# Patient Record
Sex: Female | Born: 1950 | Race: White | Hispanic: No | Marital: Married | Smoking: Former smoker
Health system: Southern US, Community
[De-identification: ages and names within clinical notes are randomized; demographics above are authoritative.]

## PROBLEM LIST (undated history)

## (undated) DIAGNOSIS — F419 Anxiety disorder, unspecified: Secondary | ICD-10-CM

## (undated) DIAGNOSIS — H269 Unspecified cataract: Secondary | ICD-10-CM

## (undated) DIAGNOSIS — K5792 Diverticulitis of intestine, part unspecified, without perforation or abscess without bleeding: Secondary | ICD-10-CM

## (undated) DIAGNOSIS — G709 Myoneural disorder, unspecified: Secondary | ICD-10-CM

## (undated) DIAGNOSIS — E782 Mixed hyperlipidemia: Secondary | ICD-10-CM

## (undated) DIAGNOSIS — F32A Depression, unspecified: Secondary | ICD-10-CM

## (undated) DIAGNOSIS — K219 Gastro-esophageal reflux disease without esophagitis: Secondary | ICD-10-CM

## (undated) DIAGNOSIS — F329 Major depressive disorder, single episode, unspecified: Secondary | ICD-10-CM

## (undated) DIAGNOSIS — M1990 Unspecified osteoarthritis, unspecified site: Secondary | ICD-10-CM

## (undated) DIAGNOSIS — T7840XA Allergy, unspecified, initial encounter: Secondary | ICD-10-CM

## (undated) DIAGNOSIS — J439 Emphysema, unspecified: Secondary | ICD-10-CM

## (undated) DIAGNOSIS — M810 Age-related osteoporosis without current pathological fracture: Secondary | ICD-10-CM

## (undated) DIAGNOSIS — K559 Vascular disorder of intestine, unspecified: Secondary | ICD-10-CM

## (undated) DIAGNOSIS — L719 Rosacea, unspecified: Secondary | ICD-10-CM

## (undated) HISTORY — DX: Vascular disorder of intestine, unspecified: K55.9

## (undated) HISTORY — DX: Diverticulitis of intestine, part unspecified, without perforation or abscess without bleeding: K57.92

## (undated) HISTORY — PX: APPENDECTOMY: SHX54

## (undated) HISTORY — DX: Depression, unspecified: F32.A

## (undated) HISTORY — DX: Unspecified cataract: H26.9

## (undated) HISTORY — DX: Major depressive disorder, single episode, unspecified: F32.9

## (undated) HISTORY — DX: Unspecified osteoarthritis, unspecified site: M19.90

## (undated) HISTORY — DX: Anxiety disorder, unspecified: F41.9

## (undated) HISTORY — DX: Mixed hyperlipidemia: E78.2

## (undated) HISTORY — DX: Myoneural disorder, unspecified: G70.9

## (undated) HISTORY — DX: Rosacea, unspecified: L71.9

## (undated) HISTORY — PX: TUBAL LIGATION: SHX77

## (undated) HISTORY — PX: ABDOMINAL HYSTERECTOMY: SHX81

## (undated) HISTORY — PX: SPINE SURGERY: SHX786

## (undated) HISTORY — DX: Emphysema, unspecified: J43.9

## (undated) HISTORY — DX: Allergy, unspecified, initial encounter: T78.40XA

## (undated) HISTORY — PX: MICRODISCECTOMY LUMBAR: SUR864

## (undated) HISTORY — DX: Gastro-esophageal reflux disease without esophagitis: K21.9

## (undated) HISTORY — DX: Age-related osteoporosis without current pathological fracture: M81.0

---

## 1998-01-03 ENCOUNTER — Other Ambulatory Visit: Admission: RE | Admit: 1998-01-03 | Discharge: 1998-01-03 | Payer: Self-pay | Admitting: Obstetrics and Gynecology

## 1998-08-12 HISTORY — PX: LUMBAR LAMINECTOMY: SHX95

## 1998-08-24 ENCOUNTER — Encounter: Admission: RE | Admit: 1998-08-24 | Discharge: 1998-11-22 | Payer: Self-pay | Admitting: Orthopaedic Surgery

## 1998-10-17 ENCOUNTER — Ambulatory Visit (HOSPITAL_COMMUNITY): Admission: RE | Admit: 1998-10-17 | Discharge: 1998-10-17 | Payer: Self-pay | Admitting: Neurosurgery

## 1998-11-10 ENCOUNTER — Ambulatory Visit (HOSPITAL_COMMUNITY): Admission: RE | Admit: 1998-11-10 | Discharge: 1998-11-10 | Payer: Self-pay | Admitting: Neurosurgery

## 1999-01-18 ENCOUNTER — Observation Stay (HOSPITAL_COMMUNITY): Admission: RE | Admit: 1999-01-18 | Discharge: 1999-01-19 | Payer: Self-pay | Admitting: Neurosurgery

## 1999-07-31 ENCOUNTER — Other Ambulatory Visit: Admission: RE | Admit: 1999-07-31 | Discharge: 1999-07-31 | Payer: Self-pay | Admitting: Obstetrics and Gynecology

## 2000-05-30 ENCOUNTER — Encounter: Payer: Self-pay | Admitting: Obstetrics and Gynecology

## 2000-05-30 ENCOUNTER — Encounter: Admission: RE | Admit: 2000-05-30 | Discharge: 2000-05-30 | Payer: Self-pay | Admitting: Obstetrics and Gynecology

## 2000-07-31 ENCOUNTER — Other Ambulatory Visit: Admission: RE | Admit: 2000-07-31 | Discharge: 2000-07-31 | Payer: Self-pay | Admitting: Vascular Surgery

## 2000-10-03 ENCOUNTER — Ambulatory Visit (HOSPITAL_COMMUNITY): Admission: RE | Admit: 2000-10-03 | Discharge: 2000-10-03 | Payer: Self-pay | Admitting: Gastroenterology

## 2001-06-05 ENCOUNTER — Encounter: Admission: RE | Admit: 2001-06-05 | Discharge: 2001-06-05 | Payer: Self-pay | Admitting: Obstetrics and Gynecology

## 2001-06-05 ENCOUNTER — Encounter: Payer: Self-pay | Admitting: Obstetrics and Gynecology

## 2001-08-03 ENCOUNTER — Other Ambulatory Visit: Admission: RE | Admit: 2001-08-03 | Discharge: 2001-08-03 | Payer: Self-pay | Admitting: Obstetrics and Gynecology

## 2001-09-14 ENCOUNTER — Encounter: Payer: Self-pay | Admitting: Gastroenterology

## 2001-09-14 ENCOUNTER — Ambulatory Visit (HOSPITAL_COMMUNITY): Admission: RE | Admit: 2001-09-14 | Discharge: 2001-09-14 | Payer: Self-pay | Admitting: Gastroenterology

## 2002-06-09 ENCOUNTER — Encounter: Admission: RE | Admit: 2002-06-09 | Discharge: 2002-06-09 | Payer: Self-pay | Admitting: Obstetrics and Gynecology

## 2002-06-09 ENCOUNTER — Encounter: Payer: Self-pay | Admitting: Obstetrics and Gynecology

## 2002-08-12 HISTORY — PX: CARPAL TUNNEL RELEASE: SHX101

## 2002-08-23 ENCOUNTER — Other Ambulatory Visit: Admission: RE | Admit: 2002-08-23 | Discharge: 2002-08-23 | Payer: Self-pay | Admitting: Obstetrics and Gynecology

## 2002-11-15 ENCOUNTER — Ambulatory Visit (HOSPITAL_COMMUNITY): Admission: RE | Admit: 2002-11-15 | Discharge: 2002-11-15 | Payer: Self-pay | Admitting: Internal Medicine

## 2002-11-15 ENCOUNTER — Encounter: Payer: Self-pay | Admitting: Internal Medicine

## 2003-07-18 ENCOUNTER — Encounter: Admission: RE | Admit: 2003-07-18 | Discharge: 2003-07-18 | Payer: Self-pay | Admitting: Obstetrics and Gynecology

## 2003-08-30 ENCOUNTER — Emergency Department (HOSPITAL_COMMUNITY): Admission: EM | Admit: 2003-08-30 | Discharge: 2003-08-31 | Payer: Self-pay | Admitting: Emergency Medicine

## 2004-01-31 ENCOUNTER — Encounter: Admission: RE | Admit: 2004-01-31 | Discharge: 2004-03-14 | Payer: Self-pay | Admitting: Neurosurgery

## 2004-02-02 ENCOUNTER — Ambulatory Visit (HOSPITAL_COMMUNITY): Admission: RE | Admit: 2004-02-02 | Discharge: 2004-02-02 | Payer: Self-pay | Admitting: Internal Medicine

## 2004-09-07 ENCOUNTER — Ambulatory Visit (HOSPITAL_COMMUNITY): Admission: RE | Admit: 2004-09-07 | Discharge: 2004-09-07 | Payer: Self-pay | Admitting: Obstetrics and Gynecology

## 2005-08-22 ENCOUNTER — Encounter: Admission: RE | Admit: 2005-08-22 | Discharge: 2005-08-22 | Payer: Self-pay | Admitting: Neurosurgery

## 2005-09-13 ENCOUNTER — Encounter: Admission: RE | Admit: 2005-09-13 | Discharge: 2005-09-13 | Payer: Self-pay | Admitting: Neurosurgery

## 2005-09-27 ENCOUNTER — Encounter: Admission: RE | Admit: 2005-09-27 | Discharge: 2005-09-27 | Payer: Self-pay | Admitting: Neurosurgery

## 2005-12-09 ENCOUNTER — Ambulatory Visit (HOSPITAL_COMMUNITY): Admission: RE | Admit: 2005-12-09 | Discharge: 2005-12-09 | Payer: Self-pay | Admitting: Obstetrics and Gynecology

## 2006-02-14 ENCOUNTER — Ambulatory Visit (HOSPITAL_COMMUNITY): Admission: RE | Admit: 2006-02-14 | Discharge: 2006-02-14 | Payer: Self-pay | Admitting: Internal Medicine

## 2006-03-05 ENCOUNTER — Ambulatory Visit: Payer: Self-pay | Admitting: Cardiology

## 2006-03-11 ENCOUNTER — Ambulatory Visit: Payer: Self-pay | Admitting: Cardiology

## 2006-03-14 ENCOUNTER — Ambulatory Visit: Payer: Self-pay | Admitting: Cardiology

## 2006-03-14 ENCOUNTER — Inpatient Hospital Stay (HOSPITAL_BASED_OUTPATIENT_CLINIC_OR_DEPARTMENT_OTHER): Admission: RE | Admit: 2006-03-14 | Discharge: 2006-03-14 | Payer: Self-pay | Admitting: Cardiology

## 2006-03-25 ENCOUNTER — Ambulatory Visit: Payer: Self-pay | Admitting: Cardiology

## 2007-01-01 ENCOUNTER — Ambulatory Visit (HOSPITAL_COMMUNITY): Admission: RE | Admit: 2007-01-01 | Discharge: 2007-01-01 | Payer: Self-pay | Admitting: Internal Medicine

## 2008-01-22 ENCOUNTER — Ambulatory Visit (HOSPITAL_COMMUNITY): Admission: RE | Admit: 2008-01-22 | Discharge: 2008-01-22 | Payer: Self-pay | Admitting: Obstetrics and Gynecology

## 2008-04-01 ENCOUNTER — Ambulatory Visit (HOSPITAL_COMMUNITY): Admission: RE | Admit: 2008-04-01 | Discharge: 2008-04-01 | Payer: Self-pay | Admitting: Internal Medicine

## 2008-11-02 ENCOUNTER — Ambulatory Visit (HOSPITAL_COMMUNITY): Admission: RE | Admit: 2008-11-02 | Discharge: 2008-11-02 | Payer: Self-pay | Admitting: Neurosurgery

## 2008-11-15 ENCOUNTER — Ambulatory Visit: Payer: Self-pay | Admitting: Vascular Surgery

## 2008-11-15 ENCOUNTER — Encounter (INDEPENDENT_AMBULATORY_CARE_PROVIDER_SITE_OTHER): Payer: Self-pay | Admitting: Neurosurgery

## 2008-11-15 ENCOUNTER — Ambulatory Visit: Admission: RE | Admit: 2008-11-15 | Discharge: 2008-11-15 | Payer: Self-pay | Admitting: Neurosurgery

## 2009-02-20 ENCOUNTER — Ambulatory Visit (HOSPITAL_COMMUNITY): Admission: RE | Admit: 2009-02-20 | Discharge: 2009-02-20 | Payer: Self-pay | Admitting: Internal Medicine

## 2010-03-05 ENCOUNTER — Ambulatory Visit (HOSPITAL_COMMUNITY): Admission: RE | Admit: 2010-03-05 | Discharge: 2010-03-05 | Payer: Self-pay | Admitting: Obstetrics and Gynecology

## 2010-09-01 ENCOUNTER — Encounter: Payer: Self-pay | Admitting: Obstetrics and Gynecology

## 2010-11-22 LAB — CBC
HCT: 34.3 % — ABNORMAL LOW (ref 36.0–46.0)
Hemoglobin: 12 g/dL (ref 12.0–15.0)
MCHC: 35.1 g/dL (ref 30.0–36.0)
MCV: 90.3 fL (ref 78.0–100.0)
Platelets: 213 10*3/uL (ref 150–400)
RBC: 3.79 MIL/uL — ABNORMAL LOW (ref 3.87–5.11)
RDW: 12.6 % (ref 11.5–15.5)
WBC: 7.3 10*3/uL (ref 4.0–10.5)

## 2010-11-22 LAB — BASIC METABOLIC PANEL
BUN: 15 mg/dL (ref 6–23)
CO2: 28 mEq/L (ref 19–32)
Calcium: 9.6 mg/dL (ref 8.4–10.5)
Chloride: 103 mEq/L (ref 96–112)
Creatinine, Ser: 0.61 mg/dL (ref 0.4–1.2)
GFR calc Af Amer: >60 mL/min (ref >60)
GFR calc non Af Amer: >60 mL/min (ref >60)
Glucose, Bld: 113 mg/dL — ABNORMAL HIGH (ref 70–99)
Potassium: 4.5 mEq/L (ref 3.5–5.1)
Sodium: 139 mEq/L (ref 135–145)

## 2010-12-25 NOTE — Op Note (Signed)
Kathryn Lamb, Kathryn Lamb                ACCOUNT NO.:  [id number]   MEDICAL RECORD NO.:  [id number]          PATIENT TYPE:  OIB   LOCATION:  3528                         FACILITY:  [facility]   PHYSICIAN:  Cristi Loron, M.D.DATE OF BIRTH:  May 13, 1951   DATE OF PROCEDURE:  11/02/2008  DATE OF DISCHARGE:  11/02/2008                               OPERATIVE REPORT   BRIEF HISTORY:  The patient is a 60 year old white female who has had  previous L4 and L5 laminotomies and foraminotomies in June 2000.  She  did well until recently, but developed a right leg pain consistent with  a right L5 and/or S1 radiculopathy.  She failed medical management which  was worked up with a lumbar MRI, which demonstrated she had herniated  disk at L5-S1 on the right and discussed the various treatment options  with the patient including surgery.  The patient has weighed the risks,  benefits, and alternatives of surgery and decided to proceed with the  right L4-L5 diskectomy.   PREOPERATIVE DIAGNOSES:  Right L5-S1 herniated nucleus pulposus,  lumbago, lumbar radiculopathy.   PREOPERATIVE DIAGNOSES:  Right L5-S1 herniated nucleus pulposus,  lumbago, lumbar radiculopathy.   PROCEDURE:  Right L5-S1 redo diskectomy to decompress the right L5 as  well as S1 nerve roots using microdissection.   SURGEON:  Cristi Loron, MD   ASSISTANT:  [name], MD   ANESTHESIA:  General endotracheal.   ESTIMATED BLOOD LOSS:  50 mL.   SPECIMENS:  None.   DRAINS:  None.   COMPLICATIONS:  Small dural tear.   DESCRIPTION OF PROCEDURE:  The patient was brought to the operating room  by the Anesthesia Team.  General endotracheal anesthesia was induced.  The patient was turned to the prone position on the Wilson frame.  Her  lumbosacral region was then prepared with Betadine scrub and Betadine  solution.  Sterile drapes were applied.  I then injected the area to be  incised with Marcaine with epinephrine  solution.  I used a scalpel to  make a linear midline incision over the L5-S1 interspace.  I used  electrocautery to perform a right-sided subperiosteal dissection  exposing the right spinous process and lamina of L5.  We obtained  intraoperative radiograph to confirm our location and then inserted a  McCullough retractor for exposure.   We began the decompression by using a high-speed drill to extend the  patient's prior left L5 laminotomy in the cephalad direction.  We  drilled until we encountered some relatively non-scarred down dura.  We  then brought the operative microscope into the field and on  electromagnification and illumination, we completed the  microdissection/decompression.  We used the Kerrison punch to widen the  laminotomy and to remove some of the epidural scar tissue.  We then  performed a foraminotomy about the right S1 nerve root.  The patient  also had a bit of conjoined nerve root at L5, i.e. the nerve root came  in almost at the right angle to the thecal sac and running directly into  the neural foramina.  We freed  up the L5 and S1 nerve roots from  epidural fibrosis using microdissection and we freed up the thecal sac.  Dr. Newell Coral then gently retracted the thecal sac in the S1 nerve root  medially with the D'Errico retractor.  This exposed underlying disk  herniation which had migrated caudally from the intervertebral disk  space at L5-S1.  We removed in multiple fragments using pituitary  forceps.  We then inspected the L5-S1 intervertebral disk.  It was  bulging mildly, but we did not see any impending herniations.  Disk  herniation was compressing both the right L5 and S1 nerve roots prior to  its removal.  We then obtained hemostasis using bipolar electrocautery.  We irrigated the wound out with bacitracin solution.  We then palpated  along the ventral surface of the thecal sac along the exit route of the  right L5 and S1 nerve roots and noted neural  structure well  decompressed.  We did also note that there was a minute durotomy just  caudal to the takeoff of the L5 nerve root.  We could barely see the  arachnoid, there was no CSF leakage, but we covered this over with  DuraSeal to prevent any leakage.  We then removed the retractors and  reapproximated the patient's thoracolumbar fascia with interrupted #1  Vicryl suture, subcutaneous tissue with 2-0 Vicryl suture, and the skin  with Steri-Strips and Benzoin.  The wound was then coated with  bacitracin ointment and sterile dressing was applied.  The drapes were  removed.  The patient was subsequently returned to the supine position  and subsequently extubated by the Anesthesia Team and transported to the  postanesthesia care unit in stable condition.  All sponge, instrument,  and needle counts were correct at end of the case.      Cristi Loron, M.D.  Electronically Signed     JDJ/MEDQ  D:  11/03/2008  T:  11/04/2008  Job:  [id number]

## 2010-12-28 NOTE — Assessment & Plan Note (Signed)
[facility]                              CARDIOLOGY OFFICE NOTE   LASHELLE, KOY                       MRN:          [id number]  DATE:03/05/2006                            DOB:          Mar 11, 1951    I was asked by Marisue Brooklyn to evaluate Mrs. Sivertson, a delightful 60-year-  old married white female with dyspnea on exertion and chest pressure since  early this year.   She noticed this all of a sudden one day. It goes away with rest after a few  minutes.   Her cardiac risk factors include 25 years of smoking  two packs per day.  She quit in 2003.  She also has a family history of coronary disease, the  father having his first MI at age 21.  She has hyperlipidemia and is  currently on 80 mg of Zocor.   PAST MEDICAL HISTORY:  SHE IS CURRENTLY ALLERGIC TO PENICILLIN AND SULFA.  She is on Zocor 8 mg a day, Zyrtec 5 mg a day, Premarin 0.3 mg a day,  Neurontin 300 mg a day, calcium 1200 mg a day, multivitamin daily, V  complex.   She takes ibuprofen quite regularly for her back.   PAST SURGICAL HISTORY:  Hysterectomy, back surgery, carpal tunnel surgery.   FAMILY HISTORY:  As above.   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   REVIEW OF SYSTEMS:  She denies any orthopnea, PND, peripheral edema.  She  has some COPD and has some dyspnea on exertion from that but this is clearly  different.  She has chronic fatigue, a lot of gastroesophageal reflux,  history of arthritis, anxiety and depression.   PHYSICAL EXAMINATION:  VITAL SIGNS:  Blood pressure 130/70, pulse is 72 and  regular, height is 5 feet 3-1/1 inches, she weighs 155 pounds. She is in no  acute distress. She is extremely pleasant.  SKIN:  Warm and dry. HEENT:  Normocephalic, atraumatic.  PERRLA.  Extraocular movements intact.  Sclera  clear.  Facial symmetry is normal.  Dentition is satisfactory. Carotids are  full without bruits.  There is no JVD.  Thyroid is not enlarged.  Trachea is  midline. LUNGS:  Clear to auscultation and percussion.  HEART:  Regular rate  and rhythm without murmurs or gallops.  ABDOMEN:  Soft with good bowel  sounds.  There are no midline bruits.  No hepatosplenomegaly.  EXTREMITIES:  No clubbing, cyanosis or edema. Pulses are brisk.  NEUROLOGIC EXAM:  Intact.   LABORATORY:  Electrocardiogram shows normal sinus rhythm with nonspecific ST  segment changes inferolaterally.   ASSESSMENT:  1.  Exertional angina.  Rule out obstructive coronary artery disease.  2.  Hyperlipidemia.  3.  History of heavy tobacco use, now quit.  4.  Family history of coronary disease.   I had a long talk, greater than 20  minutes with Mrs. Eulah Pont.  I have  recommended diagnostic core angiography as an outpatient.  I suspect she  will have at  least one high grade lesion, hopefully can treat this either  percutaneously or medically.   We went over risk factors and how to be aggressive with this. I started her  also on an aspirin a day.  Indications, risks, potential benefits of the  catheterization were explained.  The patient agrees to proceed.                               Thomas C. Daleen Squibb, MD, [facility]    TCW/MedQ  DD:  03/05/2006  DT:  03/05/2006  Job #:  [id number]   cc:   Lovenia Kim, DO

## 2010-12-28 NOTE — Cardiovascular Report (Signed)
NAMEDANICE, DIPPOLITO                ACCOUNT NO.:  [id number]   MEDICAL RECORD NO.:  [id number]          PATIENT TYPE:  OIB   LOCATION:  1962                         FACILITY:  [facility]   PHYSICIAN:  Rollene Rotunda, M.D.   DATE OF BIRTH:  August 10, 1951   DATE OF PROCEDURE:  03/14/2006  DATE OF DISCHARGE:                              CARDIAC CATHETERIZATION   PROCEDURE:  Left heart catheterization/coronary arteriography.   INDICATION:  Evaluate patient with an abnormal Cardiolite suggesting  anterior wall ischemia.  She had chest pain with multiple cardiovascular  risk factors.   PROCEDURAL NOTE:  Left heart catheterization was performed via the right  femoral artery.  The artery was cannulated using an anterior wall puncture.  A #4 French arterial sheath was inserted via the modified Seldinger  technique.  Preformed Judkins and a pigtail catheter were utilized.  The  patient tolerated the procedure well and left the laboratory in stable  condition.   RESULTS:  Hemodynamics:  LV 133/8, AO 136/94.   Coronaries:  The left main was normal.  The LAD was normal.  There was a  large first diagonal which was normal.  The circumflex in the AV groove was  normal.  There was a large ramus intermediate which was normal.  There was a  moderate sized obtuse marginal which was normal.  The right coronary artery  was dominant and normal throughout its course.  There was a PDA which was  moderate-sized and normal.  A posterolateral was small and normal.   Left ventriculogram:  The left ventriculogram was obtained in the RAO  projection.  The EF was 65% with normal wall motion.   CONCLUSION:  Normal coronary arteries.  Normal left ventricular function.   PLAN:  No further cardiac workup is suggested.  The patient will continue  with primary risk reduction.  She will continue to have evaluation of non-  anginal chest pain by her primary care doctor.           ______________________________  Rollene Rotunda, M.D.     JH/MEDQ  D:  03/14/2006  T:  03/14/2006  Job:  [id number]   cc:   Lovenia Kim, D.O.  Thomas C. Wall, M.D.

## 2010-12-28 NOTE — Assessment & Plan Note (Signed)
[facility]                              CARDIOLOGY OFFICE NOTE   DEJAI, SCHUBACH                       MRN:          [id number]  DATE:03/25/2006                            DOB:          31-May-1951    HISTORY OF PRESENT ILLNESS:  Ms. Crosley returns today for further management  of her dyspnea on exertion and chest pressure.   She underwent cardiac catheterization which showed normal coronary artery  disease and normal left ventricular function.  We have to assume that her  dyspnea on exertion is from a heavy history of tobacco use.   She has multiple questions today which I have answered.  She is on Zocor for  hyperlipidemia which I have told her to stay on.  I have also recommended  one enteric-coated aspirin 81 mg a day.  I have asked her to limit her  ibuprofen.   Her blood pressure today was elevated at 148/81.  Her pulse was 75 and  regular, weight was 152.  Respiratory exam was unchanged.   PLAN:  1. Continue Zocor.  2. Enteric-coated aspirin 81 mg a day.  3. Limit ibuprofen.  4. Follow up blood pressure with Dr. Elisabeth Most.  See [place] back p.r.n.                               Thomas C. Daleen Squibb, MD, [facility]    TCW/MedQ  DD:  03/25/2006  DT:  03/25/2006  Job #:  [id number]   cc:   Lovenia Kim, DO

## 2011-02-22 ENCOUNTER — Other Ambulatory Visit (HOSPITAL_COMMUNITY): Payer: Self-pay | Admitting: Internal Medicine

## 2011-02-22 DIAGNOSIS — Z1231 Encounter for screening mammogram for malignant neoplasm of breast: Secondary | ICD-10-CM

## 2011-03-07 ENCOUNTER — Ambulatory Visit (HOSPITAL_COMMUNITY)
Admission: RE | Admit: 2011-03-07 | Discharge: 2011-03-07 | Disposition: A | Discharge status: Home / Self Care | Payer: BC Managed Care – PPO | Source: Ambulatory Visit | Attending: Internal Medicine | Admitting: Internal Medicine

## 2011-03-07 DIAGNOSIS — Z1231 Encounter for screening mammogram for malignant neoplasm of breast: Secondary | ICD-10-CM | POA: Insufficient documentation

## 2012-03-09 ENCOUNTER — Other Ambulatory Visit (HOSPITAL_COMMUNITY): Payer: Self-pay | Admitting: Obstetrics and Gynecology

## 2012-03-09 ENCOUNTER — Other Ambulatory Visit: Payer: Self-pay | Admitting: Obstetrics and Gynecology

## 2012-03-09 DIAGNOSIS — Z1231 Encounter for screening mammogram for malignant neoplasm of breast: Secondary | ICD-10-CM

## 2012-03-26 ENCOUNTER — Ambulatory Visit (HOSPITAL_COMMUNITY)
Admission: RE | Admit: 2012-03-26 | Discharge: 2012-03-26 | Disposition: A | Discharge status: Home / Self Care | Payer: BC Managed Care – PPO | Source: Ambulatory Visit | Attending: Obstetrics and Gynecology | Admitting: Obstetrics and Gynecology

## 2012-03-26 DIAGNOSIS — Z1231 Encounter for screening mammogram for malignant neoplasm of breast: Secondary | ICD-10-CM | POA: Insufficient documentation

## 2012-04-09 ENCOUNTER — Ambulatory Visit (HOSPITAL_COMMUNITY)
Admission: RE | Admit: 2012-04-09 | Discharge: 2012-04-09 | Disposition: A | Discharge status: Home / Self Care | Payer: BC Managed Care – PPO | Source: Ambulatory Visit | Attending: Internal Medicine | Admitting: Internal Medicine

## 2012-04-09 ENCOUNTER — Other Ambulatory Visit (HOSPITAL_COMMUNITY): Payer: Self-pay | Admitting: Internal Medicine

## 2012-04-09 DIAGNOSIS — R05 Cough: Secondary | ICD-10-CM

## 2012-04-09 DIAGNOSIS — R059 Cough, unspecified: Secondary | ICD-10-CM | POA: Insufficient documentation

## 2012-04-09 DIAGNOSIS — Z87891 Personal history of nicotine dependence: Secondary | ICD-10-CM | POA: Insufficient documentation

## 2013-03-18 ENCOUNTER — Other Ambulatory Visit (HOSPITAL_COMMUNITY): Payer: Self-pay | Admitting: Obstetrics and Gynecology

## 2013-03-18 DIAGNOSIS — Z1231 Encounter for screening mammogram for malignant neoplasm of breast: Secondary | ICD-10-CM

## 2013-03-29 ENCOUNTER — Ambulatory Visit (HOSPITAL_COMMUNITY)
Admission: RE | Admit: 2013-03-29 | Discharge: 2013-03-29 | Disposition: A | Discharge status: Home / Self Care | Payer: BC Managed Care – PPO | Source: Ambulatory Visit | Attending: Obstetrics and Gynecology | Admitting: Obstetrics and Gynecology

## 2013-03-29 DIAGNOSIS — Z1231 Encounter for screening mammogram for malignant neoplasm of breast: Secondary | ICD-10-CM | POA: Insufficient documentation

## 2013-06-29 ENCOUNTER — Other Ambulatory Visit: Payer: Self-pay | Admitting: Emergency Medicine

## 2013-06-29 MED ORDER — GABAPENTIN 100 MG PO CAPS: 100.0000 mg | ORAL_CAPSULE | Freq: Three times a day (TID) | ORAL | Status: DC

## 2013-07-01 ENCOUNTER — Other Ambulatory Visit: Payer: Self-pay | Admitting: Internal Medicine

## 2013-09-13 ENCOUNTER — Other Ambulatory Visit: Payer: Self-pay | Admitting: Emergency Medicine

## 2013-09-13 MED ORDER — BUPROPION HCL ER (XL) 300 MG PO TB24: 300.0000 mg | ORAL_TABLET | Freq: Every day | ORAL | Status: DC

## 2013-09-15 ENCOUNTER — Other Ambulatory Visit: Payer: Self-pay | Admitting: Emergency Medicine

## 2013-09-15 MED ORDER — GABAPENTIN 100 MG PO CAPS: 100.0000 mg | ORAL_CAPSULE | Freq: Three times a day (TID) | ORAL | Status: DC

## 2013-09-17 NOTE — Progress Notes (Signed)
Pt called today (09-17-2013) said that pharm told her she needed an ov before she can get a full refill. She did get 30 pills. Pt did not make an appt today. Told her she needs ov before her 30 pills run out per [employer]. jsi

## 2013-09-27 ENCOUNTER — Encounter: Payer: Self-pay | Admitting: *Deleted

## 2013-09-27 DIAGNOSIS — F325 Major depressive disorder, single episode, in full remission: Secondary | ICD-10-CM | POA: Insufficient documentation

## 2013-09-27 DIAGNOSIS — Z8719 Personal history of other diseases of the digestive system: Secondary | ICD-10-CM | POA: Insufficient documentation

## 2013-09-27 DIAGNOSIS — F419 Anxiety disorder, unspecified: Secondary | ICD-10-CM | POA: Insufficient documentation

## 2013-09-27 DIAGNOSIS — L719 Rosacea, unspecified: Secondary | ICD-10-CM | POA: Insufficient documentation

## 2013-10-01 ENCOUNTER — Ambulatory Visit: Payer: Self-pay | Admitting: Emergency Medicine

## 2013-10-04 ENCOUNTER — Ambulatory Visit (INDEPENDENT_AMBULATORY_CARE_PROVIDER_SITE_OTHER): Payer: BC Managed Care – PPO | Admitting: Emergency Medicine

## 2013-10-04 ENCOUNTER — Encounter: Payer: Self-pay | Admitting: Emergency Medicine

## 2013-10-04 VITALS — BP 132/80 | HR 72 | Temp 98.0°F | Resp 18 | Ht 62.75 in | Wt 147.0 lb

## 2013-10-04 DIAGNOSIS — R7309 Other abnormal glucose: Secondary | ICD-10-CM

## 2013-10-04 DIAGNOSIS — R030 Elevated blood-pressure reading, without diagnosis of hypertension: Secondary | ICD-10-CM

## 2013-10-04 DIAGNOSIS — Z23 Encounter for immunization: Secondary | ICD-10-CM

## 2013-10-04 DIAGNOSIS — E559 Vitamin D deficiency, unspecified: Secondary | ICD-10-CM

## 2013-10-04 DIAGNOSIS — Z79899 Other long term (current) drug therapy: Secondary | ICD-10-CM

## 2013-10-04 DIAGNOSIS — E782 Mixed hyperlipidemia: Secondary | ICD-10-CM

## 2013-10-04 LAB — HEPATIC FUNCTION PANEL
ALT: 22 U/L (ref 0–35)
AST: 20 U/L (ref 0–37)
Albumin: 5 g/dL (ref 3.5–5.2)
Alkaline Phosphatase: 41 U/L (ref 39–117)
Bilirubin, Direct: 0.1 mg/dL (ref 0.0–0.3)
Indirect Bilirubin: 0.5 mg/dL (ref 0.2–1.2)
Total Bilirubin: 0.6 mg/dL (ref 0.2–1.2)
Total Protein: 7.4 g/dL (ref 6.0–8.3)

## 2013-10-04 LAB — CBC WITH DIFFERENTIAL/PLATELET
Basophils Absolute: 0.1 10*3/uL (ref 0.0–0.1)
Basophils Relative: 1 % (ref 0–1)
EOS PCT: 4 % (ref 0–5)
Eosinophils Absolute: 0.2 10*3/uL (ref 0.0–0.7)
HCT: 37.8 % (ref 36.0–46.0)
HEMOGLOBIN: 13.3 g/dL (ref 12.0–15.0)
LYMPHS ABS: 2.6 10*3/uL (ref 0.7–4.0)
LYMPHS PCT: 50 % — AB (ref 12–46)
MCH: 30.6 pg (ref 26.0–34.0)
MCHC: 35.2 g/dL (ref 30.0–36.0)
MCV: 87.1 fL (ref 78.0–100.0)
MONOS PCT: 6 % (ref 3–12)
Monocytes Absolute: 0.3 10*3/uL (ref 0.1–1.0)
Neutro Abs: 2 10*3/uL (ref 1.7–7.7)
Neutrophils Relative %: 39 % — ABNORMAL LOW (ref 43–77)
PLATELETS: 262 10*3/uL (ref 150–400)
RBC: 4.34 MIL/uL (ref 3.87–5.11)
RDW: 13.5 % (ref 11.5–15.5)
WBC: 5.2 10*3/uL (ref 4.0–10.5)

## 2013-10-04 LAB — LIPID PANEL
CHOL/HDL RATIO: 3.8 ratio
Cholesterol: 185 mg/dL (ref 0–200)
HDL: 49 mg/dL (ref >39)
LDL Cholesterol: 98 mg/dL (ref 0–99)
Triglycerides: 192 mg/dL — ABNORMAL HIGH (ref <150)
VLDL: 38 mg/dL (ref 0–40)

## 2013-10-04 LAB — BASIC METABOLIC PANEL WITH GFR
BUN: 12 mg/dL (ref 6–23)
CHLORIDE: 104 meq/L (ref 96–112)
CO2: 30 meq/L (ref 19–32)
CREATININE: 0.76 mg/dL (ref 0.50–1.10)
Calcium: 10.2 mg/dL (ref 8.4–10.5)
GFR, EST NON AFRICAN AMERICAN: 83 mL/min
GFR, Est African American: >89 mL/min
Glucose, Bld: 82 mg/dL (ref 70–99)
POTASSIUM: 4.2 meq/L (ref 3.5–5.3)
Sodium: 142 mEq/L (ref 135–145)

## 2013-10-04 LAB — MAGNESIUM: Magnesium: 2.2 mg/dL (ref 1.5–2.5)

## 2013-10-04 LAB — HEMOGLOBIN A1C
Hgb A1c MFr Bld: 5.4 % (ref <5.7)
Mean Plasma Glucose: 108 mg/dL (ref <117)

## 2013-10-04 MED ORDER — GABAPENTIN 100 MG PO CAPS: ORAL_CAPSULE | ORAL | Status: DC

## 2013-10-04 NOTE — Progress Notes (Signed)
   Subjective:    Patient ID: Kathryn Lamb, female    DOB: October 01, 1950, 63 y.o.   MRN: [id number]  HPI Comments: 63 yo female presents for 3 month F/U for elevated BP w/o HTN, Cholesterol, Pre-Dm, D. Deficient. She does not check BP routinely. LAST LABS T 218 TG 165 L 133 A1C 5.7MAG 2 D 80 She is eating healthy small portions. She is not exercising but keeps active.   Health maintenance was reviewed and updated Pneumovax was given since Tobacco HX in past, Quit 2010.   Hyperlipidemia   Current Outpatient Prescriptions on File Prior to Visit  Medication Sig Dispense Refill  . Flaxseed, Linseed, (FLAX SEED OIL) 1000 MG CAPS Take 2,600 mg by mouth daily.       . Multiple Vitamin (MULTIVITAMIN WITH MINERALS) TABS tablet Take 1 tablet by mouth. Takes 3 times per week. M,W,F      . Omega-3 Fatty Acids (FISH OIL) 1000 MG CAPS Take 3,000 mg by mouth daily.      . Probiotic Product (PROBIOTIC DAILY PO) Take by mouth daily.       No current facility-administered medications on file prior to visit.   Allergies  Allergen Reactions  . Doxycycline   . Erythromycin   . Flagyl [Metronidazole]   . Penicillins   . Sulfa Antibiotics    Past Medical History  Diagnosis Date  . Rosacea   . Anxiety   . Depression   . Diverticulitis   . Ischemic colitis        Review of Systems  All other systems reviewed and are negative.   BP 132/80  Pulse 72  Temp(Src) 98 F (36.7 C) (Temporal)  Resp 18  Ht 5' 2.75" (1.594 m)  Wt 147 lb (66.679 kg)  BMI 26.24 kg/m2     Objective:   Physical Exam  Nursing note and vitals reviewed. Constitutional: She is oriented to person, place, and time. She appears well-developed and well-nourished. No distress.  HENT:  Head: Normocephalic and atraumatic.  Right Ear: External ear normal.  Left Ear: External ear normal.  Nose: Nose normal.  Mouth/Throat: Oropharynx is clear and moist.  Eyes: Conjunctivae and EOM are normal.  Neck: Normal range of motion.  Neck supple. No JVD present. No thyromegaly present.  Cardiovascular: Normal rate, regular rhythm, normal heart sounds and intact distal pulses.   Pulmonary/Chest: Effort normal and breath sounds normal.  Abdominal: Soft. Bowel sounds are normal. She exhibits no distension and no mass. There is no tenderness. There is no rebound and no guarding.  Musculoskeletal: Normal range of motion. She exhibits no edema and no tenderness.  Lymphadenopathy:    She has no cervical adenopathy.  Neurological: She is alert and oriented to person, place, and time. No cranial nerve deficit.  Skin: Skin is warm and dry. No rash noted. No erythema. No pallor.  Psychiatric: She has a normal mood and affect. Her behavior is normal. Judgment and thought content normal.          Assessment & Plan:  1.  3 month F/U for elevated BP w/o HTN, Cholesterol, Pre-Dm, D. Deficient. Needs healthy diet, cardio QD and obtain healthy weight. Check Labs, Check BP if >130/80 call office 2. Maintenance reviewed/ updated Pneumovax wit past HX of tobacco Quit 2010

## 2013-10-04 NOTE — Patient Instructions (Signed)

## 2013-10-05 ENCOUNTER — Encounter: Payer: Self-pay | Admitting: Emergency Medicine

## 2013-10-05 DIAGNOSIS — E782 Mixed hyperlipidemia: Secondary | ICD-10-CM

## 2013-10-05 HISTORY — DX: Mixed hyperlipidemia: E78.2

## 2013-10-05 LAB — INSULIN, FASTING: Insulin fasting, serum: 17 u[IU]/mL (ref 3–28)

## 2013-11-29 ENCOUNTER — Other Ambulatory Visit: Payer: Self-pay | Admitting: Emergency Medicine

## 2014-01-07 ENCOUNTER — Other Ambulatory Visit: Payer: Self-pay | Admitting: Physician Assistant

## 2014-01-18 ENCOUNTER — Ambulatory Visit (INDEPENDENT_AMBULATORY_CARE_PROVIDER_SITE_OTHER): Payer: BC Managed Care – PPO | Admitting: Emergency Medicine

## 2014-01-18 ENCOUNTER — Encounter: Payer: Self-pay | Admitting: Emergency Medicine

## 2014-01-18 VITALS — BP 148/76 | HR 68 | Temp 98.4°F | Resp 16 | Ht 62.75 in | Wt 150.0 lb

## 2014-01-18 DIAGNOSIS — E782 Mixed hyperlipidemia: Secondary | ICD-10-CM

## 2014-01-18 DIAGNOSIS — R030 Elevated blood-pressure reading, without diagnosis of hypertension: Secondary | ICD-10-CM

## 2014-01-18 DIAGNOSIS — R7309 Other abnormal glucose: Secondary | ICD-10-CM

## 2014-01-18 LAB — HEPATIC FUNCTION PANEL
ALBUMIN: 4.8 g/dL (ref 3.5–5.2)
ALK PHOS: 45 U/L (ref 39–117)
ALT: 24 U/L (ref 0–35)
AST: 20 U/L (ref 0–37)
Bilirubin, Direct: 0.1 mg/dL (ref 0.0–0.3)
Indirect Bilirubin: 0.6 mg/dL (ref 0.2–1.2)
TOTAL PROTEIN: 7.1 g/dL (ref 6.0–8.3)
Total Bilirubin: 0.7 mg/dL (ref 0.2–1.2)

## 2014-01-18 LAB — LIPID PANEL
CHOLESTEROL: 187 mg/dL (ref 0–200)
HDL: 48 mg/dL (ref >39)
LDL Cholesterol: 104 mg/dL — ABNORMAL HIGH (ref 0–99)
TRIGLYCERIDES: 176 mg/dL — AB (ref <150)
Total CHOL/HDL Ratio: 3.9 Ratio
VLDL: 35 mg/dL (ref 0–40)

## 2014-01-18 LAB — CBC WITH DIFFERENTIAL/PLATELET
Basophils Absolute: 0 10*3/uL (ref 0.0–0.1)
Basophils Relative: 0 % (ref 0–1)
EOS ABS: 0.2 10*3/uL (ref 0.0–0.7)
EOS PCT: 3 % (ref 0–5)
HCT: 37.1 % (ref 36.0–46.0)
HEMOGLOBIN: 12.8 g/dL (ref 12.0–15.0)
LYMPHS ABS: 2.6 10*3/uL (ref 0.7–4.0)
Lymphocytes Relative: 45 % (ref 12–46)
MCH: 29.8 pg (ref 26.0–34.0)
MCHC: 34.5 g/dL (ref 30.0–36.0)
MCV: 86.5 fL (ref 78.0–100.0)
MONO ABS: 0.4 10*3/uL (ref 0.1–1.0)
MONOS PCT: 7 % (ref 3–12)
NEUTROS PCT: 45 % (ref 43–77)
Neutro Abs: 2.6 10*3/uL (ref 1.7–7.7)
Platelets: 272 10*3/uL (ref 150–400)
RBC: 4.29 MIL/uL (ref 3.87–5.11)
RDW: 13.7 % (ref 11.5–15.5)
WBC: 5.7 10*3/uL (ref 4.0–10.5)

## 2014-01-18 LAB — BASIC METABOLIC PANEL WITH GFR
BUN: 12 mg/dL (ref 6–23)
CALCIUM: 10.3 mg/dL (ref 8.4–10.5)
CO2: 29 mEq/L (ref 19–32)
CREATININE: 0.82 mg/dL (ref 0.50–1.10)
Chloride: 102 mEq/L (ref 96–112)
GFR, Est African American: 87 mL/min
GFR, Est Non African American: 76 mL/min
Glucose, Bld: 90 mg/dL (ref 70–99)
Potassium: 4.1 mEq/L (ref 3.5–5.3)
Sodium: 138 mEq/L (ref 135–145)

## 2014-01-18 NOTE — Progress Notes (Signed)
Subjective:    Patient ID: Kathryn Lamb, female    DOB: 05-Dec-1950, 63 y.o.   MRN: [id number]  HPI Comments: 63 yo WF presents for 3 month F/U for elevated BP w/o HTN, Cholesterol, Pre-Dm, D. Deficient. She has not been exercising. She has not been eating healthy. She has not been checking BP. She has been stressing more.  WBC             5.2   10/04/2013 HGB            13.3   10/04/2013 HCT            37.8   10/04/2013 PLT             262   10/04/2013 GLUCOSE          82   10/04/2013 CHOL            185   10/04/2013 TRIG            192   10/04/2013 HDL              49   10/04/2013 LDLCALC          98   10/04/2013 ALT              22   10/04/2013 AST              20   10/04/2013 NA              142   10/04/2013 K               4.2   10/04/2013 CL              104   10/04/2013 CREATININE     0.76   10/04/2013 BUN              12   10/04/2013 CO2              30   10/04/2013 HGBA1C          5.4   10/04/2013    Hyperlipidemia     Medication List       This list is accurate as of: 01/18/14 11:08 AM.  Always use your most recent med list.               buPROPion 300 MG 24 hr tablet  Commonly known as:  WELLBUTRIN XL  Take 300 mg by mouth daily.     CALCIUM 1200 PO  Take 1,200 mg by mouth daily.     cetirizine 5 MG tablet  Commonly known as:  ZYRTEC  Take 5 mg by mouth daily.     cholecalciferol 1000 UNITS tablet  Commonly known as:  VITAMIN D  Take 2,000 Units by mouth once a week.     CoQ10 100 MG Caps  Take 100 mg by mouth daily.     Fish Oil 1000 MG Caps  Take 3,000 mg by mouth daily.     Flax Seed Oil 1000 MG Caps  Take 2,600 mg by mouth daily.     gabapentin 100 MG capsule  Commonly known as:  NEURONTIN  TAKE 2 CAPSULES BY MOUTH 3 TIMES A DAY     multivitamin with minerals Tabs tablet  Take 1 tablet by mouth. Takes 3 times per week. M,W,F     PROBIOTIC DAILY PO  Take by mouth daily.     rosuvastatin 10 MG tablet  Commonly known  as:  CRESTOR  Take 10 mg by mouth  daily. M,W,F     rosuvastatin 5 MG tablet  Commonly known as:  CRESTOR  Take 5 mg by mouth daily. Tues, Thurs, Sat, Sun       Allergies  Allergen Reactions  . Doxycycline   . Erythromycin   . Flagyl [Metronidazole]   . Penicillins   . Sulfa Antibiotics    Past Medical History  Diagnosis Date  . Rosacea   . Anxiety   . Depression   . Diverticulitis   . Ischemic colitis   . Mixed hyperlipidemia 10/05/2013      Review of Systems  Psychiatric/Behavioral: The patient is nervous/anxious.   All other systems reviewed and are negative.  BP 148/76  Pulse 68  Temp(Src) 98.4 F (36.9 C) (Temporal)  Resp 16  Ht 5' 2.75" (1.594 m)  Wt 150 lb (68.04 kg)  BMI 26.78 kg/m2     Objective:   Physical Exam  Nursing note and vitals reviewed. Constitutional: She is oriented to person, place, and time. She appears well-developed and well-nourished. No distress.  HENT:  Head: Normocephalic and atraumatic.  Right Ear: External ear normal.  Left Ear: External ear normal.  Nose: Nose normal.  Mouth/Throat: Oropharynx is clear and moist.  Eyes: Conjunctivae and EOM are normal.  Neck: Normal range of motion. Neck supple. No JVD present. No thyromegaly present.  Cardiovascular: Normal rate, regular rhythm, normal heart sounds and intact distal pulses.   Pulmonary/Chest: Effort normal and breath sounds normal.  Abdominal: Soft. Bowel sounds are normal. She exhibits no distension and no mass. There is no tenderness.  Musculoskeletal: Normal range of motion. She exhibits no edema and no tenderness.  Lymphadenopathy:    She has no cervical adenopathy.  Neurological: She is alert and oriented to person, place, and time. No cranial nerve deficit.  Skin: Skin is warm and dry. No rash noted. No erythema. No pallor.  Psychiatric: She has a normal mood and affect. Her behavior is normal. Judgment and thought content normal.          Assessment & Plan:  1.  3 month F/U for elevated BP  w/oHTN, Cholesterol, Pre-Dm, D. Deficient. Needs healthy diet, cardio QD and obtain healthy weight. Check Labs, Check BP if >130/80 call office   2. Stress- advised 10 min mental clarity walks w/c if symptoms increase for prescription management

## 2014-01-18 NOTE — Patient Instructions (Signed)
Hypertension Hypertension is another name for high blood pressure. High blood pressure may mean that your heart needs to work harder to pump blood. Blood pressure consists of two numbers, which includes a higher number over a lower number (example: 110/72). HOME CARE   Make lifestyle changes as told by your doctor. This may include weight loss and exercise.  Take your blood pressure medicine every day.  Limit how much salt you use.  Stop smoking if you smoke.  Do not use drugs.  Talk to your doctor if you are using decongestants or birth control pills. These medicines might make blood pressure higher.  Females should not drink more than 1 alcoholic drink per day. Males should not drink more than 2 alcoholic drinks per day.  See your doctor as told. GET HELP RIGHT AWAY IF:   You have a blood pressure reading with a top number of 180 or higher.  You get a very bad headache.  You get blurred or changing vision.  You feel confused.  You feel weak, numb, or faint.  You get chest or belly (abdominal) pain.  You throw up (vomit).  You cannot breathe very well. MAKE SURE YOU:   Understand these instructions.  Will watch your condition.  Will get help right away if you are not doing well or get worse. Document Released: 01/15/2008 Document Revised: 10/21/2011 Document Reviewed: 01/15/2008 [employer] Patient Information 2014 [employer].  

## 2014-03-20 ENCOUNTER — Other Ambulatory Visit: Payer: Self-pay | Admitting: Physician Assistant

## 2014-04-06 ENCOUNTER — Other Ambulatory Visit: Payer: Self-pay | Admitting: Emergency Medicine

## 2014-05-04 ENCOUNTER — Ambulatory Visit (INDEPENDENT_AMBULATORY_CARE_PROVIDER_SITE_OTHER): Payer: BC Managed Care – PPO | Admitting: Emergency Medicine

## 2014-05-04 ENCOUNTER — Encounter: Payer: Self-pay | Admitting: Emergency Medicine

## 2014-05-04 VITALS — BP 122/66 | HR 74 | Temp 97.8°F | Resp 16 | Ht 62.5 in | Wt 150.0 lb

## 2014-05-04 DIAGNOSIS — R5381 Other malaise: Secondary | ICD-10-CM

## 2014-05-04 DIAGNOSIS — E782 Mixed hyperlipidemia: Secondary | ICD-10-CM

## 2014-05-04 DIAGNOSIS — E638 Other specified nutritional deficiencies: Secondary | ICD-10-CM

## 2014-05-04 DIAGNOSIS — Z1212 Encounter for screening for malignant neoplasm of rectum: Secondary | ICD-10-CM

## 2014-05-04 DIAGNOSIS — Z0000 Encounter for general adult medical examination without abnormal findings: Secondary | ICD-10-CM

## 2014-05-04 DIAGNOSIS — Z111 Encounter for screening for respiratory tuberculosis: Secondary | ICD-10-CM

## 2014-05-04 DIAGNOSIS — Z79899 Other long term (current) drug therapy: Secondary | ICD-10-CM

## 2014-05-04 DIAGNOSIS — M79609 Pain in unspecified limb: Secondary | ICD-10-CM

## 2014-05-04 DIAGNOSIS — R5383 Other fatigue: Secondary | ICD-10-CM

## 2014-05-04 LAB — CBC WITH DIFFERENTIAL/PLATELET
BASOS ABS: 0 10*3/uL (ref 0.0–0.1)
BASOS PCT: 0 % (ref 0–1)
Eosinophils Absolute: 0.2 10*3/uL (ref 0.0–0.7)
Eosinophils Relative: 3 % (ref 0–5)
HCT: 37.5 % (ref 36.0–46.0)
Hemoglobin: 12.8 g/dL (ref 12.0–15.0)
Lymphocytes Relative: 47 % — ABNORMAL HIGH (ref 12–46)
Lymphs Abs: 2.8 10*3/uL (ref 0.7–4.0)
MCH: 30.2 pg (ref 26.0–34.0)
MCHC: 34.1 g/dL (ref 30.0–36.0)
MCV: 88.4 fL (ref 78.0–100.0)
Monocytes Absolute: 0.4 10*3/uL (ref 0.1–1.0)
Monocytes Relative: 6 % (ref 3–12)
NEUTROS ABS: 2.6 10*3/uL (ref 1.7–7.7)
NEUTROS PCT: 44 % (ref 43–77)
Platelets: 277 10*3/uL (ref 150–400)
RBC: 4.24 MIL/uL (ref 3.87–5.11)
RDW: 13.7 % (ref 11.5–15.5)
WBC: 5.9 10*3/uL (ref 4.0–10.5)

## 2014-05-04 NOTE — Patient Instructions (Signed)

## 2014-05-04 NOTE — Progress Notes (Signed)
Subjective:    Patient ID: Kathryn Lamb, female    DOB: 1950/10/15, 63 y.o.   MRN: [id number]  HPI Comments: 63 yo WF CPE and Cholesterol f/u. She has not been exercising routinely or eating as healthy. 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 She had to increase Gabapentin 7 pills QD with increased pain in leg with extra care of mother. She notes mild fatigue/ aches increase with extra work around [employer] care.   She is otherwise doing well.   WBC             5.7   01/18/2014 HGB            12.8   01/18/2014 HCT            37.1   01/18/2014 PLT             272   01/18/2014 GLUCOSE          90   01/18/2014 CHOL            187   01/18/2014 TRIG            176   01/18/2014 HDL              48   01/18/2014 LDLCALC         104   01/18/2014 ALT              24   01/18/2014 AST              20   01/18/2014 NA              138   01/18/2014 K               4.1   01/18/2014 CL              102   01/18/2014 CREATININE     0.82   01/18/2014 BUN              12   01/18/2014 CO2              29   01/18/2014 HGBA1C          5.4   10/04/2013   Hyperlipidemia Associated symptoms include myalgias. Pertinent negatives include no chest pain or shortness of breath.     Medication List       This list is accurate as of: 05/04/14  2:16 PM.  Always use your most recent med list.               buPROPion 300 MG 24 hr tablet  Commonly known as:  WELLBUTRIN XL  TAKE 1 TABLET EVERY DAY     CALCIUM 1200 PO  Take 1,200 mg by mouth daily.     cetirizine 5 MG tablet  Commonly known as:  ZYRTEC  Take 5 mg by mouth daily.     CoQ10 100 MG Caps  Take 100 mg by mouth daily.     Fish Oil 1000 MG Caps  Take 3,000 mg by mouth daily.     Flax Seed Oil 1000 MG Caps  Take 2,600 mg by mouth daily.     gabapentin 100 MG capsule  Commonly known as:  NEURONTIN  TAKE 2 CAPSULES BY MOUTH 3 TIMES A DAY     multivitamin with minerals Tabs tablet  Take 1 tablet by mouth. Takes 3 times per week. M,W,F     PROBIOTIC  DAILY PO  Take by mouth daily.     rosuvastatin 10 MG tablet  Commonly known as:  CRESTOR  Take 10 mg by mouth daily. M,W,F     rosuvastatin 5 MG tablet  Commonly known as:  CRESTOR  Take 5 mg by mouth daily. Tues, Thurs, Sat, Sun     Vitamin D 2000 UNITS tablet  Take 2,000 Units by mouth daily.       Allergies  Allergen Reactions  . Doxycycline   . Erythromycin   . Flagyl [Metronidazole]   . Penicillins   . Sulfa Antibiotics    Past Medical History  Diagnosis Date  . Rosacea   . Anxiety   . Depression   . Diverticulitis   . Ischemic colitis   . Mixed hyperlipidemia 10/05/2013   Past Surgical History  Procedure Laterality Date  . Abdominal hysterectomy    . Tubal ligation    . Lumbar laminectomy  2000  . Carpal tunnel release Right 2004  . Appendectomy     History  Substance Use Topics  . Smoking status: Former Smoker    Quit date: 10/04/2008  . Smokeless tobacco: Not on file  . Alcohol Use: Not on file   Family History  Problem Relation Age of Onset  . Hypertension Mother   . Stroke Mother   . Heart disease Mother   . Diabetes Father   . Alcohol abuse Father   . Cancer Father     prostate, lung  . Diabetes Sister    MAINTENANCE: Colonoscopy: 2013 NL per pt Mammo:03/29/13 wnl BMD:2009 @gyn  WNL Pap/ Pelvic: 2014 WNL @ gyn EYE: overdue Dentist:Q 6 month  IMMUNIZATIONS: Tdap:2013 Influenza:2014   Patient Care Team: Unk Pinto, MD as PCP - General (Internal Medicine) Mayme Genta, MD as Consulting Physician (Gastroenterology) Simona Huh, MD as Consulting Physician (Dermatology) Newman Pies, MD as Consulting Physician (Neurosurgery) [place], [facility]) Laurin Coder, (GYN)    Review of Systems  Constitutional: Positive for fatigue.  Respiratory: Negative for shortness of breath.   Cardiovascular: Negative for chest pain.  Musculoskeletal: Positive for myalgias.  All other systems reviewed and are negative.  BP  122/66  Pulse 74  Temp(Src) 97.8 F (36.6 C) (Temporal)  Resp 16  Ht 5' 2.5" (1.588 m)  Wt 150 lb (68.04 kg)  BMI 26.98 kg/m2     Objective:   Physical Exam  Nursing note and vitals reviewed. Constitutional: She is oriented to person, place, and time. She appears well-developed and well-nourished. No distress.  HENT:  Head: Normocephalic and atraumatic.  Right Ear: External ear normal.  Left Ear: External ear normal.  Nose: Nose normal.  Mouth/Throat: Oropharynx is clear and moist.  Eyes: Conjunctivae and EOM are normal. Pupils are equal, round, and reactive to light. Right eye exhibits no discharge. Left eye exhibits no discharge. No scleral icterus.  Neck: Normal range of motion. Neck supple. No JVD present. No tracheal deviation present. No thyromegaly present.  Cardiovascular: Normal rate, regular rhythm, normal heart sounds and intact distal pulses.   Pulmonary/Chest: Effort normal and breath sounds normal.  Abdominal: Soft. Bowel sounds are normal. She exhibits no distension and no mass. There is no tenderness. There is no rebound and no guarding.  Genitourinary:  Def gyn  Musculoskeletal: Normal range of motion. She exhibits no edema and no tenderness.  Lymphadenopathy:    She has no cervical adenopathy.  Neurological: She is alert and oriented to person, place, and time. She has normal reflexes. No cranial nerve  deficit. She exhibits normal muscle tone. Coordination normal.  Skin: Skin is warm and dry. No rash noted. No erythema. No pallor.  Psychiatric: She has a normal mood and affect. Her behavior is normal. Judgment and thought content normal.    AORTA SCAN WNL EKG NSCSPT     Assessment & Plan:  1. CPE- Update screening labs/ History/ Immunizations/ Testing as needed. Advised healthy diet, QD exercise, increase H20 and continue RX/ Vitamins AD.   2. Cholesterol- recheck labs, Need to eat healthier and exercise AD.   3. Fatigue- check labs, increase activity and  H2O   4. Myalgias with chol X- check labs

## 2014-05-05 LAB — LIPID PANEL
CHOL/HDL RATIO: 3.7 ratio
Cholesterol: 184 mg/dL (ref 0–200)
HDL: 50 mg/dL (ref >39)
LDL Cholesterol: 98 mg/dL (ref 0–99)
Triglycerides: 180 mg/dL — ABNORMAL HIGH (ref <150)
VLDL: 36 mg/dL (ref 0–40)

## 2014-05-05 LAB — BASIC METABOLIC PANEL WITH GFR
BUN: 12 mg/dL (ref 6–23)
CO2: 29 mEq/L (ref 19–32)
Calcium: 10.6 mg/dL — ABNORMAL HIGH (ref 8.4–10.5)
Chloride: 104 mEq/L (ref 96–112)
Creat: 0.79 mg/dL (ref 0.50–1.10)
GFR, EST NON AFRICAN AMERICAN: 79 mL/min
GLUCOSE: 77 mg/dL (ref 70–99)
POTASSIUM: 4.5 meq/L (ref 3.5–5.3)
Sodium: 140 mEq/L (ref 135–145)

## 2014-05-05 LAB — HEPATIC FUNCTION PANEL
ALK PHOS: 46 U/L (ref 39–117)
ALT: 26 U/L (ref 0–35)
AST: 21 U/L (ref 0–37)
Albumin: 4.6 g/dL (ref 3.5–5.2)
BILIRUBIN TOTAL: 0.8 mg/dL (ref 0.2–1.2)
Bilirubin, Direct: 0.2 mg/dL (ref 0.0–0.3)
Indirect Bilirubin: 0.6 mg/dL (ref 0.2–1.2)
Total Protein: 7.2 g/dL (ref 6.0–8.3)

## 2014-05-05 LAB — VITAMIN B12: VITAMIN B 12: 483 pg/mL (ref 211–911)

## 2014-05-05 LAB — MICROALBUMIN / CREATININE URINE RATIO
Creatinine, Urine: 42.5 mg/dL
MICROALB UR: 0.3 mg/dL (ref <2.0)
Microalb Creat Ratio: 7.1 mg/g (ref 0.0–30.0)

## 2014-05-05 LAB — CK: CK TOTAL: 143 U/L (ref 7–177)

## 2014-05-05 LAB — MAGNESIUM: Magnesium: 1.9 mg/dL (ref 1.5–2.5)

## 2014-05-05 LAB — TSH: TSH: 2.036 u[IU]/mL (ref 0.350–4.500)

## 2014-05-05 LAB — HEMOGLOBIN A1C
Hgb A1c MFr Bld: 5.8 % — ABNORMAL HIGH (ref <5.7)
MEAN PLASMA GLUCOSE: 120 mg/dL — AB (ref <117)

## 2014-05-05 LAB — URINALYSIS, ROUTINE W REFLEX MICROSCOPIC
Bilirubin Urine: NEGATIVE
Glucose, UA: NEGATIVE mg/dL
Hgb urine dipstick: NEGATIVE
KETONES UR: NEGATIVE mg/dL
Leukocytes, UA: NEGATIVE
NITRITE: NEGATIVE
Protein, ur: NEGATIVE mg/dL
SPECIFIC GRAVITY, URINE: 1.006 (ref 1.005–1.030)
UROBILINOGEN UA: 0.2 mg/dL (ref 0.0–1.0)
pH: 6.5 (ref 5.0–8.0)

## 2014-05-05 LAB — IRON AND TIBC
%SAT: 35 % (ref 20–55)
Iron: 133 ug/dL (ref 42–145)
TIBC: 384 ug/dL (ref 250–470)
UIBC: 251 ug/dL (ref 125–400)

## 2014-05-05 LAB — VITAMIN D 25 HYDROXY (VIT D DEFICIENCY, FRACTURES): Vit D, 25-Hydroxy: 68 ng/mL (ref 30–89)

## 2014-05-05 LAB — FOLATE RBC: RBC Folate: 775 ng/mL (ref >280)

## 2014-05-05 LAB — INSULIN, FASTING: Insulin fasting, serum: 7.1 u[IU]/mL (ref 2.0–19.6)

## 2014-05-06 LAB — ALDOLASE: ALDOLASE: 6.3 U/L (ref <=8.1)

## 2014-05-06 LAB — TB SKIN TEST
INDURATION: 0 mm
TB Skin Test: NEGATIVE

## 2014-05-12 ENCOUNTER — Other Ambulatory Visit: Payer: Self-pay | Admitting: Emergency Medicine

## 2014-05-12 ENCOUNTER — Other Ambulatory Visit: Payer: Self-pay | Admitting: Internal Medicine

## 2014-05-17 ENCOUNTER — Ambulatory Visit (INDEPENDENT_AMBULATORY_CARE_PROVIDER_SITE_OTHER): Payer: BC Managed Care – PPO | Admitting: *Deleted

## 2014-05-17 DIAGNOSIS — Z23 Encounter for immunization: Secondary | ICD-10-CM

## 2014-05-27 ENCOUNTER — Other Ambulatory Visit: Payer: Self-pay | Admitting: Internal Medicine

## 2014-05-27 DIAGNOSIS — Z1231 Encounter for screening mammogram for malignant neoplasm of breast: Secondary | ICD-10-CM

## 2014-06-21 ENCOUNTER — Ambulatory Visit (HOSPITAL_COMMUNITY)
Admission: RE | Admit: 2014-06-21 | Discharge: 2014-06-21 | Disposition: A | Discharge status: Home / Self Care | Payer: BC Managed Care – PPO | Source: Ambulatory Visit | Attending: Internal Medicine | Admitting: Internal Medicine

## 2014-06-21 DIAGNOSIS — Z1231 Encounter for screening mammogram for malignant neoplasm of breast: Secondary | ICD-10-CM | POA: Insufficient documentation

## 2014-07-04 ENCOUNTER — Other Ambulatory Visit: Payer: Self-pay | Admitting: *Deleted

## 2014-07-04 MED ORDER — BUPROPION HCL ER (XL) 300 MG PO TB24: 300.0000 mg | ORAL_TABLET | Freq: Every day | ORAL | Status: DC

## 2014-07-06 ENCOUNTER — Other Ambulatory Visit: Payer: Self-pay

## 2014-07-06 MED ORDER — GABAPENTIN 100 MG PO CAPS: 200.0000 mg | ORAL_CAPSULE | Freq: Three times a day (TID) | ORAL | Status: DC

## 2014-07-28 ENCOUNTER — Ambulatory Visit: Payer: Self-pay | Admitting: Physician Assistant

## 2014-08-22 ENCOUNTER — Ambulatory Visit (INDEPENDENT_AMBULATORY_CARE_PROVIDER_SITE_OTHER): Payer: BLUE CROSS/BLUE SHIELD | Admitting: Physician Assistant

## 2014-08-22 VITALS — BP 122/70 | HR 68 | Temp 98.1°F | Resp 16 | Ht 62.5 in | Wt 148.0 lb

## 2014-08-22 DIAGNOSIS — K559 Vascular disorder of intestine, unspecified: Secondary | ICD-10-CM

## 2014-08-22 DIAGNOSIS — Z79899 Other long term (current) drug therapy: Secondary | ICD-10-CM

## 2014-08-22 DIAGNOSIS — R7309 Other abnormal glucose: Secondary | ICD-10-CM | POA: Insufficient documentation

## 2014-08-22 DIAGNOSIS — E782 Mixed hyperlipidemia: Secondary | ICD-10-CM

## 2014-08-22 DIAGNOSIS — R7303 Prediabetes: Secondary | ICD-10-CM

## 2014-08-22 DIAGNOSIS — F329 Major depressive disorder, single episode, unspecified: Secondary | ICD-10-CM

## 2014-08-22 DIAGNOSIS — E559 Vitamin D deficiency, unspecified: Secondary | ICD-10-CM

## 2014-08-22 DIAGNOSIS — F32A Depression, unspecified: Secondary | ICD-10-CM

## 2014-08-22 DIAGNOSIS — F419 Anxiety disorder, unspecified: Secondary | ICD-10-CM

## 2014-08-22 LAB — CBC WITH DIFFERENTIAL/PLATELET
BASOS ABS: 0 10*3/uL (ref 0.0–0.1)
Basophils Relative: 0 % (ref 0–1)
EOS ABS: 0.2 10*3/uL (ref 0.0–0.7)
Eosinophils Relative: 4 % (ref 0–5)
HCT: 37.2 % (ref 36.0–46.0)
HEMOGLOBIN: 12.5 g/dL (ref 12.0–15.0)
Lymphocytes Relative: 46 % (ref 12–46)
Lymphs Abs: 2.2 10*3/uL (ref 0.7–4.0)
MCH: 29.8 pg (ref 26.0–34.0)
MCHC: 33.6 g/dL (ref 30.0–36.0)
MCV: 88.8 fL (ref 78.0–100.0)
MONOS PCT: 7 % (ref 3–12)
MPV: 8.9 fL (ref 8.6–12.4)
Monocytes Absolute: 0.3 10*3/uL (ref 0.1–1.0)
NEUTROS ABS: 2 10*3/uL (ref 1.7–7.7)
Neutrophils Relative %: 43 % (ref 43–77)
Platelets: 284 10*3/uL (ref 150–400)
RBC: 4.19 MIL/uL (ref 3.87–5.11)
RDW: 13.7 % (ref 11.5–15.5)
WBC: 4.7 10*3/uL (ref 4.0–10.5)

## 2014-08-22 NOTE — Progress Notes (Signed)
Assessment and Plan:  Hypertension: Continue medication, monitor blood pressure at home. Continue DASH diet.  Reminder to go to the ER if any CP, SOB, nausea, dizziness, severe HA, changes vision/speech, left arm numbness and tingling, and jaw pain. Cholesterol: Continue diet and exercise. Check cholesterol.  Pre-diabetes-Continue diet and exercise. Check A1C Vitamin D Def- check level and continue medications.   Continue diet and meds as discussed. Further disposition pending results of labs.  HPI 64 y.o. female  presents for 3 month follow up with hypertension, hyperlipidemia, prediabetes and vitamin D. Her blood pressure has been controlled at home, today their BP is BP: 122/70 mmHg She does workout, some walking when weather is nice. She denies chest pain, shortness of breath, dizziness.  She is on cholesterol medication, crestor 10mg  3 days a week and 5 mg the other days and denies myalgias. Her cholesterol is at goal. The cholesterol last visit was:   Lab Results  Component Value Date   CHOL 184 05/04/2014   HDL 50 05/04/2014   LDLCALC 98 05/04/2014   TRIG 180* 05/04/2014   CHOLHDL 3.7 05/04/2014  She has been working on diet and exercise for prediabetes, and denies paresthesia of the feet, polydipsia and polyuria. Last A1C in the office was:  Lab Results  Component Value Date   HGBA1C 5.8* 05/04/2014  Patient is on Vitamin D supplement.   Lab Results  Component Value Date   VD25OH 62 05/04/2014  She had some IBS over the holidays, she is doing better now.  She is on wellbutrin XL 300 for depression and anxiety which helps.    Watching over 3 small kittens and this has been causing some lower back pain.   Current Medications:  Current Outpatient Prescriptions on File Prior to Visit  Medication Sig Dispense Refill  . buPROPion (WELLBUTRIN XL) 300 MG 24 hr tablet Take 1 tablet (300 mg total) by mouth daily. 90 tablet 0  . Calcium Carbonate-Vit D-Min (CALCIUM 1200 PO) Take  1,200 mg by mouth daily.    . cetirizine (ZYRTEC) 5 MG tablet Take 5 mg by mouth daily.    . Cholecalciferol (VITAMIN D) 2000 UNITS tablet Take 2,000 Units by mouth daily.    . Coenzyme Q10 (COQ10) 100 MG CAPS Take 100 mg by mouth daily.    . CRESTOR 10 MG tablet TAKE 1 TABLET EVERY DAY 30 tablet 5  . Flaxseed, Linseed, (FLAX SEED OIL) 1000 MG CAPS Take 2,600 mg by mouth daily.     Marland Kitchen gabapentin (NEURONTIN) 100 MG capsule Take 2 capsules (200 mg total) by mouth 3 (three) times daily. 180 capsule 3  . Multiple Vitamin (MULTIVITAMIN WITH MINERALS) TABS tablet Take 1 tablet by mouth. Takes 3 times per week. M,W,F    . Omega-3 Fatty Acids (FISH OIL) 1000 MG CAPS Take 3,000 mg by mouth daily.    . Probiotic Product (PROBIOTIC DAILY PO) Take by mouth daily.     No current facility-administered medications on file prior to visit.   Medical History:  Past Medical History  Diagnosis Date  . Rosacea   . Anxiety   . Depression   . Diverticulitis   . Ischemic colitis   . Mixed hyperlipidemia 10/05/2013   Allergies:  Allergies  Allergen Reactions  . Doxycycline   . Erythromycin   . Flagyl [Metronidazole]   . Penicillins   . Sulfa Antibiotics     Review of Systems:  Review of Systems  Constitutional: Negative.   HENT: Negative.  Eyes: Negative.   Respiratory: Negative.   Cardiovascular: Negative.   Gastrointestinal: Positive for diarrhea. Negative for heartburn, nausea, vomiting, abdominal pain, constipation, blood in stool and melena.  Genitourinary: Negative.   Musculoskeletal: Positive for back pain. Negative for myalgias, joint pain, falls and neck pain.  Skin: Negative.   Neurological: Negative.   Psychiatric/Behavioral: Negative.     Family history- Review and unchanged Social history- Review and unchanged Physical Exam: BP 122/70 mmHg  Pulse 68  Temp(Src) 98.1 F (36.7 C)  Resp 16  Ht 5' 2.5" (1.588 m)  Wt 148 lb (67.132 kg)  BMI 26.62 kg/m2 Wt Readings from Last 3  Encounters:  08/22/14 148 lb (67.132 kg)  05/04/14 150 lb (68.04 kg)  01/18/14 150 lb (68.04 kg)   General Appearance: Well nourished, in no apparent distress. Eyes: PERRLA, EOMs, conjunctiva no swelling or erythema Sinuses: No Frontal/maxillary tenderness ENT/Mouth: Ext aud canals clear, TMs without erythema, bulging. No erythema, swelling, or exudate on post pharynx.  Tonsils not swollen or erythematous. Hearing normal.  Neck: Supple, thyroid normal.  Respiratory: Respiratory effort normal, BS equal bilaterally without rales, rhonchi, wheezing or stridor.  Cardio: RRR with no MRGs. Brisk peripheral pulses without edema.  Abdomen: Soft, + BS.  Non tender, no guarding, rebound, hernias, masses. Lymphatics: Non tender without lymphadenopathy.  Musculoskeletal: Full ROM, 5/5 strength, normal gait.  Skin: Warm, dry without rashes, lesions, ecchymosis.  Neuro: Cranial nerves intact. Normal muscle tone, no cerebellar symptoms. Sensation intact.  Psych: Awake and oriented X 3, normal affect, Insight and Judgment appropriate.    Vicie Mutters, PA-C 10:05 AM [facility]

## 2014-08-22 NOTE — Patient Instructions (Signed)

## 2014-08-23 LAB — HEPATIC FUNCTION PANEL
ALT: 20 U/L (ref 0–35)
AST: 17 U/L (ref 0–37)
Albumin: 4.4 g/dL (ref 3.5–5.2)
Alkaline Phosphatase: 48 U/L (ref 39–117)
BILIRUBIN DIRECT: 0.1 mg/dL (ref 0.0–0.3)
BILIRUBIN INDIRECT: 0.6 mg/dL (ref 0.2–1.2)
Total Bilirubin: 0.7 mg/dL (ref 0.2–1.2)
Total Protein: 6.8 g/dL (ref 6.0–8.3)

## 2014-08-23 LAB — MAGNESIUM: Magnesium: 2.1 mg/dL (ref 1.5–2.5)

## 2014-08-23 LAB — BASIC METABOLIC PANEL WITH GFR
BUN: 13 mg/dL (ref 6–23)
CO2: 30 mEq/L (ref 19–32)
Calcium: 9.7 mg/dL (ref 8.4–10.5)
Chloride: 103 mEq/L (ref 96–112)
Creat: 0.84 mg/dL (ref 0.50–1.10)
GFR, Est African American: 85 mL/min
GFR, Est Non African American: 74 mL/min
Glucose, Bld: 94 mg/dL (ref 70–99)
POTASSIUM: 4.2 meq/L (ref 3.5–5.3)
Sodium: 140 mEq/L (ref 135–145)

## 2014-08-23 LAB — INSULIN, FASTING: INSULIN FASTING, SERUM: 11.9 u[IU]/mL (ref 2.0–19.6)

## 2014-08-23 LAB — LIPID PANEL
CHOLESTEROL: 166 mg/dL (ref 0–200)
HDL: 49 mg/dL (ref >39)
LDL Cholesterol: 88 mg/dL (ref 0–99)
Total CHOL/HDL Ratio: 3.4 Ratio
Triglycerides: 147 mg/dL (ref <150)
VLDL: 29 mg/dL (ref 0–40)

## 2014-08-23 LAB — HEMOGLOBIN A1C
HEMOGLOBIN A1C: 5.9 % — AB (ref <5.7)
Mean Plasma Glucose: 123 mg/dL — ABNORMAL HIGH (ref <117)

## 2014-08-23 LAB — TSH: TSH: 2.626 u[IU]/mL (ref 0.350–4.500)

## 2014-08-23 LAB — VITAMIN D 25 HYDROXY (VIT D DEFICIENCY, FRACTURES): VIT D 25 HYDROXY: 84 ng/mL (ref 30–100)

## 2014-09-22 ENCOUNTER — Other Ambulatory Visit: Payer: Self-pay | Admitting: *Deleted

## 2014-09-22 MED ORDER — BUPROPION HCL ER (XL) 300 MG PO TB24: 300.0000 mg | ORAL_TABLET | Freq: Every day | ORAL | Status: DC

## 2014-09-22 MED ORDER — ATORVASTATIN CALCIUM 80 MG PO TABS
80.0000 mg | ORAL_TABLET | Freq: Every day | ORAL | Status: DC
Start: 2014-09-22 — End: 2015-06-15

## 2014-09-22 MED ORDER — GABAPENTIN 100 MG PO CAPS: 200.0000 mg | ORAL_CAPSULE | Freq: Three times a day (TID) | ORAL | Status: DC

## 2014-09-27 ENCOUNTER — Other Ambulatory Visit: Payer: Self-pay | Admitting: Internal Medicine

## 2015-01-10 ENCOUNTER — Encounter: Payer: Self-pay | Admitting: Physician Assistant

## 2015-01-10 ENCOUNTER — Ambulatory Visit (INDEPENDENT_AMBULATORY_CARE_PROVIDER_SITE_OTHER): Payer: Medicare Other | Admitting: Physician Assistant

## 2015-01-10 VITALS — BP 128/72 | HR 68 | Temp 97.7°F | Resp 16 | Ht 62.5 in | Wt 149.0 lb

## 2015-01-10 DIAGNOSIS — F329 Major depressive disorder, single episode, unspecified: Secondary | ICD-10-CM | POA: Diagnosis not present

## 2015-01-10 DIAGNOSIS — E782 Mixed hyperlipidemia: Secondary | ICD-10-CM | POA: Diagnosis not present

## 2015-01-10 DIAGNOSIS — R413 Other amnesia: Secondary | ICD-10-CM

## 2015-01-10 DIAGNOSIS — Z0001 Encounter for general adult medical examination with abnormal findings: Secondary | ICD-10-CM | POA: Diagnosis not present

## 2015-01-10 DIAGNOSIS — K559 Vascular disorder of intestine, unspecified: Secondary | ICD-10-CM

## 2015-01-10 DIAGNOSIS — R7309 Other abnormal glucose: Secondary | ICD-10-CM | POA: Diagnosis not present

## 2015-01-10 DIAGNOSIS — M5442 Lumbago with sciatica, left side: Secondary | ICD-10-CM

## 2015-01-10 DIAGNOSIS — Z79899 Other long term (current) drug therapy: Secondary | ICD-10-CM

## 2015-01-10 DIAGNOSIS — M545 Low back pain, unspecified: Secondary | ICD-10-CM | POA: Insufficient documentation

## 2015-01-10 DIAGNOSIS — E559 Vitamin D deficiency, unspecified: Secondary | ICD-10-CM

## 2015-01-10 DIAGNOSIS — L719 Rosacea, unspecified: Secondary | ICD-10-CM

## 2015-01-10 DIAGNOSIS — F419 Anxiety disorder, unspecified: Secondary | ICD-10-CM

## 2015-01-10 DIAGNOSIS — M5441 Lumbago with sciatica, right side: Secondary | ICD-10-CM

## 2015-01-10 DIAGNOSIS — R030 Elevated blood-pressure reading, without diagnosis of hypertension: Secondary | ICD-10-CM

## 2015-01-10 DIAGNOSIS — R6889 Other general symptoms and signs: Secondary | ICD-10-CM | POA: Diagnosis not present

## 2015-01-10 DIAGNOSIS — F32A Depression, unspecified: Secondary | ICD-10-CM

## 2015-01-10 DIAGNOSIS — Z9181 History of falling: Secondary | ICD-10-CM

## 2015-01-10 DIAGNOSIS — R7303 Prediabetes: Secondary | ICD-10-CM

## 2015-01-10 LAB — CBC WITH DIFFERENTIAL/PLATELET
BASOS PCT: 0 % (ref 0–1)
Basophils Absolute: 0 10*3/uL (ref 0.0–0.1)
EOS ABS: 0.2 10*3/uL (ref 0.0–0.7)
EOS PCT: 3 % (ref 0–5)
HCT: 38.7 % (ref 36.0–46.0)
Hemoglobin: 13.2 g/dL (ref 12.0–15.0)
Lymphocytes Relative: 48 % — ABNORMAL HIGH (ref 12–46)
Lymphs Abs: 2.4 10*3/uL (ref 0.7–4.0)
MCH: 30.1 pg (ref 26.0–34.0)
MCHC: 34.1 g/dL (ref 30.0–36.0)
MCV: 88.4 fL (ref 78.0–100.0)
MONO ABS: 0.3 10*3/uL (ref 0.1–1.0)
MPV: 8.8 fL (ref 8.6–12.4)
Monocytes Relative: 5 % (ref 3–12)
Neutro Abs: 2.2 10*3/uL (ref 1.7–7.7)
Neutrophils Relative %: 44 % (ref 43–77)
PLATELETS: 284 10*3/uL (ref 150–400)
RBC: 4.38 MIL/uL (ref 3.87–5.11)
RDW: 14.1 % (ref 11.5–15.5)
WBC: 5.1 10*3/uL (ref 4.0–10.5)

## 2015-01-10 LAB — HEPATIC FUNCTION PANEL
ALT: 23 U/L (ref 0–35)
AST: 19 U/L (ref 0–37)
Albumin: 4.8 g/dL (ref 3.5–5.2)
Alkaline Phosphatase: 51 U/L (ref 39–117)
BILIRUBIN DIRECT: 0.1 mg/dL (ref 0.0–0.3)
BILIRUBIN INDIRECT: 0.7 mg/dL (ref 0.2–1.2)
BILIRUBIN TOTAL: 0.8 mg/dL (ref 0.2–1.2)
TOTAL PROTEIN: 7.3 g/dL (ref 6.0–8.3)

## 2015-01-10 LAB — LIPID PANEL
CHOL/HDL RATIO: 4.6 ratio
Cholesterol: 240 mg/dL — ABNORMAL HIGH (ref 0–200)
HDL: 52 mg/dL (ref >=46)
LDL CALC: 149 mg/dL — AB (ref 0–99)
Triglycerides: 193 mg/dL — ABNORMAL HIGH (ref <150)
VLDL: 39 mg/dL (ref 0–40)

## 2015-01-10 LAB — BASIC METABOLIC PANEL WITH GFR
BUN: 12 mg/dL (ref 6–23)
CALCIUM: 10.1 mg/dL (ref 8.4–10.5)
CHLORIDE: 103 meq/L (ref 96–112)
CO2: 29 meq/L (ref 19–32)
Creat: 0.78 mg/dL (ref 0.50–1.10)
GFR, EST NON AFRICAN AMERICAN: 80 mL/min
GLUCOSE: 94 mg/dL (ref 70–99)
POTASSIUM: 4.2 meq/L (ref 3.5–5.3)
Sodium: 142 mEq/L (ref 135–145)

## 2015-01-10 LAB — TSH: TSH: 3.43 u[IU]/mL (ref 0.350–4.500)

## 2015-01-10 LAB — MAGNESIUM: Magnesium: 1.9 mg/dL (ref 1.5–2.5)

## 2015-01-10 NOTE — Progress Notes (Signed)
WELCOME TO MEDICARE WELLNESS VISIT AND FOLLOW UP  Assessment:   1. Mixed hyperlipidemia -continue medications, check lipids, decrease fatty foods, increase activity.  - Lipid panel  2. Prediabetes Discussed general issues about diabetes pathophysiology and management., Educational material distributed., Suggested low cholesterol diet., Encouraged aerobic exercise., Discussed foot care., Reminded to get yearly retinal exam. - Hemoglobin A1c  3. Depression Depression- continue medications, stress management techniques discussed, increase water, good sleep hygiene discussed, increase exercise, and increase veggies.   4. Anxiety controlled  5. Rosacea Follows with Derm  6. Ischemic colitis controlled  7. Vitamin D deficiency - [facility] DIABETES FOOT EXAM  8. Medication management - Magnesium  9. Bilateral low back pain with sciatica, sciatica laterality unspecified controlled  10. Elevated blood pressure reading without diagnosis of hypertension - CBC with Differential/Platelet - BASIC METABOLIC PANEL WITH GFR - Hepatic function panel - TSH  11. Memory impairment 29/30 MMSE, normal neuro, continue wellbutrin, discussed can be normal with age/versus stress, will monitor. Declined any further labs/work up, normal B12 2015.   Over 30 minutes of exam, counseling, chart review, and critical decision making was performed  Plan:   During the course of the visit the patient was educated and counseled about appropriate screening and preventive services including:    Pneumococcal vaccine   Influenza vaccine  Td vaccine  Screening electrocardiogram  Screening mammography  Bone densitometry screening  Colorectal cancer screening  Diabetes screening  Glaucoma screening  Nutrition counseling   Advanced directives: given info/requested  Conditions/risks identified: Diabetes is at goal, ACE/ARB therapy: No, Reason not on Ace Inhibitor/ARB therapy:  PreDM Urinary  Incontinence is not an issue: discussed non pharmacology and pharmacology options.  Fall risk: low- discussed PT, home fall assessment, medications.    Subjective:   Kathryn Lamb is a 64 y.o. female who presents for Welcome to Medicare Visit and 3 month follow up on hypertension, prediabetes, hyperlipidemia, vitamin D def.   Her blood pressure has been controlled at home, today their BP is BP: 128/72 mmHg She does workout. She denies chest pain, shortness of breath, dizziness.  She is on cholesterol medication, crestor 10mg  3 days a week and 5 mg the other daysand denies myalgias. Her cholesterol is at goal. The cholesterol last visit was:   Lab Results  Component Value Date   CHOL 166 08/22/2014   HDL 49 08/22/2014   LDLCALC 88 08/22/2014   TRIG 147 08/22/2014   CHOLHDL 3.4 08/22/2014   She has been working on diet and exercise for prediabetes, and denies paresthesia of the feet, polydipsia, polyuria and visual disturbances. Last A1C in the office was:  Lab Results  Component Value Date   HGBA1C 5.9* 08/22/2014   Patient is on Vitamin D supplement. Lab Results  Component Value Date   VD25OH 18 08/22/2014     She is on wellbutrin XL 300mg  for depression and anxiety which helps, she states that her memory has been worse. She has hard time remembering short and longer term things. She does have a heard time remembering words, has been progressive. She does admit to not like going out/doing things.  She is on zyrtec for allergies.  She has lower back pain with occ pain in her legs and is on gabapentin for this which helps.   Medication Review Current Outpatient Prescriptions on File Prior to Visit  Medication Sig Dispense Refill  . atorvastatin (LIPITOR) 80 MG tablet Take 1 tablet (80 mg total) by mouth daily. 90 tablet 1  .  buPROPion (WELLBUTRIN XL) 300 MG 24 hr tablet TAKE 1 TABLET (300 MG TOTAL) BY MOUTH DAILY. 90 tablet 0  . Calcium Carbonate-Vit D-Min (CALCIUM 1200 PO) Take  1,200 mg by mouth daily.    . cetirizine (ZYRTEC) 5 MG tablet Take 5 mg by mouth daily.    . Cholecalciferol (VITAMIN D) 2000 UNITS tablet Take 2,000 Units by mouth daily.    . Coenzyme Q10 (COQ10) 100 MG CAPS Take 100 mg by mouth daily.    . Flaxseed, Linseed, (FLAX SEED OIL) 1000 MG CAPS Take 2,600 mg by mouth daily.     Marland Kitchen gabapentin (NEURONTIN) 100 MG capsule Take 2 capsules (200 mg total) by mouth 3 (three) times daily. 540 capsule 1  . Multiple Vitamin (MULTIVITAMIN WITH MINERALS) TABS tablet Take 1 tablet by mouth. Takes 3 times per week. M,W,F    . Omega-3 Fatty Acids (FISH OIL) 1000 MG CAPS Take 3,000 mg by mouth daily.    . Probiotic Product (PROBIOTIC DAILY PO) Take by mouth daily.     No current facility-administered medications on file prior to visit.    Current Problems (verified) Patient Active Problem List   Diagnosis Date Noted  . Lower back pain 01/10/2015  . Prediabetes 08/22/2014  . Mixed hyperlipidemia 10/05/2013  . Rosacea   . Anxiety   . Depression   . Ischemic colitis     Screening Tests Immunization History  Administered Date(s) Administered  . Influenza Split 05/17/2013, 05/17/2014  . PPD Test 05/04/2014  . Pneumococcal Polysaccharide-23 10/04/2013  . Td 08/30/2002  . Tdap 04/07/2012   Preventative care: Last colonoscopy: 2013 Last mammogram: 06/2014 Last pap smear/pelvic exam: 2014 DEXA:2009 suggest getting at OB/GYN CXR 2013 MRI lumbar spine 2007  Prior vaccinations: TD or Tdap: 2013  Influenza: 2015 Pneumococcal: 09/2013 Prevnar13: DUE but out of in the office Shingles/Zostavax: check price   Names of Other Physician/Practitioners you currently use: 1. [facility]- here for primary care 2. Dr. Joya San, eye doctor, last visit 3 years, overdue 3. Dr. Eula Listen, dentist, last visit May 2016 4. Laurin Coder, OB/GYN Patient Care Team: Unk Pinto, MD as PCP - General (Internal Medicine) Richmond Campbell, MD as Consulting Physician (Gastroenterology) Druscilla Brownie, MD as Consulting Physician (Dermatology) Newman Pies, MD as Consulting Physician (Neurosurgery)  Past Surgical History  Procedure Laterality Date  . Abdominal hysterectomy    . Tubal ligation    . Lumbar laminectomy  2000  . Carpal tunnel release Right 2004  . Appendectomy     Family History  Problem Relation Age of Onset  . Hypertension Mother   . Stroke Mother   . Heart disease Mother   . Diabetes Father   . Alcohol abuse Father   . Cancer Father     prostate, lung  . Diabetes Sister    History  Substance Use Topics  . Smoking status: Former Smoker    Quit date: 10/04/2008  . Smokeless tobacco: Never Used  . Alcohol Use: No    MEDICARE WELLNESS OBJECTIVES: Tobacco use: She does not smoke.  Patient is a former smoker. Alcohol Current alcohol use: none Caffeine Current caffeine use: coffee 3 /day and caffeinated soft drinks 1 /day Osteoporosis: postmenopausal estrogen deficiency and dietary calcium and/or vitamin D deficiency, History of fracture in the past year: no Diet: in general, a "healthy" diet   Physical activity: walking Depression/mood screen:   Depression screen [facility] 2/9 01/10/2015  Decreased Interest 0  Down, Depressed, Hopeless  0  PHQ - 2 Score 0   Hearing: normal Visual acuity: normal,  does not perform annual eye exam  ADLs:  In your present state of health, do you have any difficulty performing the following activities: 01/10/2015  Hearing? N  Vision? N  Difficulty concentrating or making decisions? N  Walking or climbing stairs? N  Dressing or bathing? N  Doing errands, shopping? N  Preparing Food and eating ? N  Using the Toilet? N  In the past six months, have you accidently leaked urine? N  Do you have problems with loss of bowel control? N  Managing your Medications? N  Managing your Finances? N  Housekeeping or managing your Housekeeping? N    Fall risk: Low  Risk EOL planning: Does patient have an advance directive?: No Would patient like information on creating an advanced directive?: Yes - Educational materials given  Mini Mental Status Exam  Score Max Score  "What is the year? Season? Date? Day of the week? Month?" 5 5  "Where are we now: State? County? Town/city? Hospital? Floor?" 5 5  Name 3 objects (apple, penny, book) then Ask the patient to name all three of them.  3 3  "I would like you to count backward from 100 by sevens." (93, 86, 79, 72, 65, .) Stop after five answers. Alternative: "Spell WORLD backwards." (D-[id number]-W) 5 5  Recall 3 things 2 3  Name two simple objects 2 2  "Repeat the phrase: 'No ifs, ands, or buts.'" 2 1  "Take the paper in your right hand, fold it in half, and hand it back." 3 3  "Please read this and do what it says." (Written instruction is "Close your eyes.") 1 1  "Make up and write a sentence about anything."  ( noun and a verb.) 1 1  Draw the face of a clock 1 1  Total 29 30   Interpretation of the MMSE Method Score Interpretation  Score Interpretation  24-30 No cognitive impairment  18-23 Mild cognitive impairment  0-17 Severe cognitive impairment      Objective:   Blood pressure 128/72, pulse 68, temperature 97.7 F (36.5 C), resp. rate 16, height 5' 2.5" (1.588 m), weight 149 lb (67.586 kg). Body mass index is 26.8 kg/(m^2).  General appearance: alert, no distress, WD/WN,  female HEENT: normocephalic, sclerae anicteric, TMs pearly, nares patent, no discharge or erythema, pharynx normal Oral cavity: MMM, no lesions Neck: supple, no lymphadenopathy, no thyromegaly, no masses Heart: RRR, normal S1, S2, no murmurs Lungs: CTA bilaterally, no wheezes, rhonchi, or rales Abdomen: +bs, soft, non tender, non distended, no masses, no hepatomegaly, no splenomegaly Musculoskeletal: nontender, no swelling, no obvious deformity Extremities: no edema, no cyanosis, no clubbing Pulses: 2+ symmetric,  upper and lower extremities, normal cap refill Neurological: alert, oriented x 3, CN2-12 intact, strength normal upper extremities and lower extremities, sensation normal throughout, DTRs 2+ throughout, no cerebellar signs, gait normal Psychiatric: normal affect, behavior normal, pleasant  Breast: defer Gyn: defer Rectal: defer   Medicare Attestation I have personally reviewed: The patient's medical and social history Their use of alcohol, tobacco or illicit drugs Their current medications and supplements The patient's functional ability including ADLs,fall risks, home safety risks, cognitive, and hearing and visual impairment Diet and physical activities Evidence for depression or mood disorders  The patient's weight, height, BMI, and visual acuity have been recorded in the chart.  I have made referrals, counseling, and provided education to the patient based on review of  the above and I have provided the patient with a written personalized care plan for preventive services.     Vicie Mutters, PA-C   01/10/2015

## 2015-01-10 NOTE — Patient Instructions (Signed)

## 2015-01-11 LAB — HEMOGLOBIN A1C
Hgb A1c MFr Bld: 5.9 % — ABNORMAL HIGH (ref <5.7)
Mean Plasma Glucose: 123 mg/dL — ABNORMAL HIGH (ref <117)

## 2015-02-06 ENCOUNTER — Other Ambulatory Visit: Payer: Self-pay

## 2015-03-21 ENCOUNTER — Other Ambulatory Visit: Payer: Self-pay | Admitting: Internal Medicine

## 2015-05-09 ENCOUNTER — Ambulatory Visit (INDEPENDENT_AMBULATORY_CARE_PROVIDER_SITE_OTHER): Payer: Medicare Other | Admitting: Physician Assistant

## 2015-05-09 ENCOUNTER — Encounter: Payer: Self-pay | Admitting: Emergency Medicine

## 2015-05-09 ENCOUNTER — Encounter: Payer: Self-pay | Admitting: Physician Assistant

## 2015-05-09 VITALS — BP 130/70 | HR 71 | Temp 97.0°F | Resp 18 | Ht 64.0 in | Wt 149.0 lb

## 2015-05-09 DIAGNOSIS — R030 Elevated blood-pressure reading, without diagnosis of hypertension: Secondary | ICD-10-CM

## 2015-05-09 DIAGNOSIS — F419 Anxiety disorder, unspecified: Secondary | ICD-10-CM

## 2015-05-09 DIAGNOSIS — Z9181 History of falling: Secondary | ICD-10-CM

## 2015-05-09 DIAGNOSIS — Z0000 Encounter for general adult medical examination without abnormal findings: Secondary | ICD-10-CM | POA: Diagnosis not present

## 2015-05-09 DIAGNOSIS — Z789 Other specified health status: Secondary | ICD-10-CM | POA: Diagnosis not present

## 2015-05-09 DIAGNOSIS — E559 Vitamin D deficiency, unspecified: Secondary | ICD-10-CM | POA: Diagnosis not present

## 2015-05-09 DIAGNOSIS — Z23 Encounter for immunization: Secondary | ICD-10-CM | POA: Diagnosis not present

## 2015-05-09 DIAGNOSIS — Z79899 Other long term (current) drug therapy: Secondary | ICD-10-CM

## 2015-05-09 DIAGNOSIS — L719 Rosacea, unspecified: Secondary | ICD-10-CM

## 2015-05-09 DIAGNOSIS — E782 Mixed hyperlipidemia: Secondary | ICD-10-CM | POA: Diagnosis not present

## 2015-05-09 DIAGNOSIS — F329 Major depressive disorder, single episode, unspecified: Secondary | ICD-10-CM | POA: Diagnosis not present

## 2015-05-09 DIAGNOSIS — M5442 Lumbago with sciatica, left side: Secondary | ICD-10-CM

## 2015-05-09 DIAGNOSIS — M5441 Lumbago with sciatica, right side: Secondary | ICD-10-CM

## 2015-05-09 DIAGNOSIS — K559 Vascular disorder of intestine, unspecified: Secondary | ICD-10-CM

## 2015-05-09 DIAGNOSIS — R7309 Other abnormal glucose: Secondary | ICD-10-CM

## 2015-05-09 DIAGNOSIS — F32A Depression, unspecified: Secondary | ICD-10-CM

## 2015-05-09 DIAGNOSIS — R7303 Prediabetes: Secondary | ICD-10-CM

## 2015-05-09 DIAGNOSIS — E2839 Other primary ovarian failure: Secondary | ICD-10-CM

## 2015-05-09 DIAGNOSIS — D649 Anemia, unspecified: Secondary | ICD-10-CM

## 2015-05-09 DIAGNOSIS — Z6826 Body mass index (BMI) 26.0-26.9, adult: Secondary | ICD-10-CM

## 2015-05-09 DIAGNOSIS — Z0001 Encounter for general adult medical examination with abnormal findings: Secondary | ICD-10-CM

## 2015-05-09 LAB — CBC WITH DIFFERENTIAL/PLATELET
Basophils Absolute: 0 10*3/uL (ref 0.0–0.1)
Basophils Relative: 0 % (ref 0–1)
EOS ABS: 0.2 10*3/uL (ref 0.0–0.7)
Eosinophils Relative: 5 % (ref 0–5)
HCT: 37.8 % (ref 36.0–46.0)
Hemoglobin: 12.9 g/dL (ref 12.0–15.0)
Lymphocytes Relative: 47 % — ABNORMAL HIGH (ref 12–46)
Lymphs Abs: 2 10*3/uL (ref 0.7–4.0)
MCH: 30.4 pg (ref 26.0–34.0)
MCHC: 34.1 g/dL (ref 30.0–36.0)
MCV: 88.9 fL (ref 78.0–100.0)
MONOS PCT: 5 % (ref 3–12)
MPV: 8.6 fL (ref 8.6–12.4)
Monocytes Absolute: 0.2 10*3/uL (ref 0.1–1.0)
NEUTROS PCT: 43 % (ref 43–77)
Neutro Abs: 1.8 10*3/uL (ref 1.7–7.7)
PLATELETS: 270 10*3/uL (ref 150–400)
RBC: 4.25 MIL/uL (ref 3.87–5.11)
RDW: 13.5 % (ref 11.5–15.5)
WBC: 4.3 10*3/uL (ref 4.0–10.5)

## 2015-05-09 NOTE — Progress Notes (Signed)
Complete Physical  Assessment and Plan: 1. Prediabetes Discussed general issues about diabetes pathophysiology and management., Educational material distributed., Suggested low cholesterol diet., Encouraged aerobic exercise., Discussed foot care., Reminded to get yearly retinal exam. - Hemoglobin A1c - Insulin, fasting - EKG 12-Lead  2. Mixed hyperlipidemia -continue medications, check lipids, decrease fatty foods, increase activity.  - Lipid panel - EKG 12-Lead  3. Ischemic colitis remission  4. Depression Remission, continue wellbutrin  5. Anxiety continue medications, stress management techniques discussed, increase water, good sleep hygiene discussed, increase exercise, and increase veggies.   6. Vitamin D deficiency - Vit D  25 hydroxy (rtn osteoporosis monitoring)  7. Medication management - Magnesium  8. Bilateral low back pain with sciatica, sciatica laterality unspecified Continue gabapentin  9. Rosacea controlled  10. Elevated blood pressure reading without diagnosis of hypertension -  DASH diet, exercise and monitor at home. Call if greater than 130/80.  - CBC with Differential/Platelet - BASIC METABOLIC PANEL WITH GFR - Hepatic function panel - TSH - Urinalysis, Routine w reflex microscopic (not at [facility]) - Microalbumin / creatinine urine ratio - EKG 12-Lead  11. Encounter for general adult medical examination with abnormal findings - DG Bone Density; Future - CBC with Differential/Platelet - BASIC METABOLIC PANEL WITH GFR - Hepatic function panel - TSH - Lipid panel - Hemoglobin A1c - Insulin, fasting - Magnesium - Vit D  25 hydroxy (rtn osteoporosis monitoring) - Urinalysis, Routine w reflex microscopic (not at [facility]) - Microalbumin / creatinine urine ratio - Iron and TIBC - Ferritin - Vitamin B12 - EKG 12-Lead - Flu vaccine HIGH DOSE PF - Pneumococcal conjugate vaccine 13-valent IM  12. Need for prophylactic vaccination against  Streptococcus pneumoniae (pneumococcus) - Pneumococcal conjugate vaccine 13-valent IM  13. Need for prophylactic vaccination and inoculation against influenza - Flu vaccine HIGH DOSE PF  14. Estrogen deficiency - DG Bone Density; Future  15. Anemia, unspecified anemia type - Iron and TIBC - Ferritin - Vitamin B12  Discussed med's effects and SE's. Screening labs and tests as requested with regular follow-up as recommended. Over 40 minutes of exam, counseling, chart review, and complex, high level critical decision making was performed this visit.   Future Appointments Date Time Provider [place]  05/08/2016 10:00 AM Vicie Mutters, PA-C [id number] None    HPI  64 y.o. female  presents for a complete physical.  Her blood pressure has been controlled at home, today their BP is BP: 130/70 mmHg She does workout. She denies chest pain, shortness of breath, dizziness.  She is on cholesterol medication, she was on crestor but was switched to lipitor due to cost, 3 days a week, and denies myalgias, she is not fasting. Her cholesterol is not at goal. The cholesterol last visit was:   Lab Results  Component Value Date   CHOL 240* 01/10/2015   HDL 52 01/10/2015   LDLCALC 149* 01/10/2015   TRIG 193* 01/10/2015   CHOLHDL 4.6 01/10/2015   She has been working on diet and exercise for prediabetes, and denies paresthesia of the feet, polydipsia, polyuria and visual disturbances. Last A1C in the office was:  Lab Results  Component Value Date   HGBA1C 5.9* 01/10/2015   Patient is on Vitamin D supplement, was elevated at least CPE, only on 1 day a week of 2000 IU.  Lab Results  Component Value Date   VD25OH 84 08/22/2014  She is on wellbutrin 300 mg for depression and anxiety which helps.  She has lower  back pain, on gabapentin.    Last visit was complaining of abnormal memory, but has 29/30 on MMSE. Normal B12.   Current Medications:  Current Outpatient Prescriptions on File  Prior to Visit  Medication Sig Dispense Refill  . atorvastatin (LIPITOR) 80 MG tablet Take 1 tablet (80 mg total) by mouth daily. 90 tablet 1  . buPROPion (WELLBUTRIN XL) 300 MG 24 hr tablet TAKE 1 TABLET (300 MG TOTAL) BY MOUTH DAILY. 90 tablet 0  . Calcium Carbonate-Vit D-Min (CALCIUM 1200 PO) Take 1,200 mg by mouth daily.    . cetirizine (ZYRTEC) 5 MG tablet Take 5 mg by mouth daily.    . Cholecalciferol (VITAMIN D) 2000 UNITS tablet Take 2,000 Units by mouth daily.    . Coenzyme Q10 (COQ10) 100 MG CAPS Take 100 mg by mouth daily.    . Flaxseed, Linseed, (FLAX SEED OIL) 1000 MG CAPS Take 2,600 mg by mouth daily.     Marland Kitchen gabapentin (NEURONTIN) 100 MG capsule Take 2 capsules by mouth 3  times daily 540 capsule 2  . Multiple Vitamin (MULTIVITAMIN WITH MINERALS) TABS tablet Take 1 tablet by mouth. Takes 3 times per week. M,W,F    . Omega-3 Fatty Acids (FISH OIL) 1000 MG CAPS Take 3,000 mg by mouth daily.    . Probiotic Product (PROBIOTIC DAILY PO) Take by mouth daily.     No current facility-administered medications on file prior to visit.   Health Maintenance:   Immunization History  Administered Date(s) Administered  . Influenza Split 05/17/2013, 05/17/2014  . PPD Test 05/04/2014  . Pneumococcal Polysaccharide-23 10/04/2013  . Td 08/30/2002  . Tdap 04/07/2012    Last colonoscopy: 2013 Last mammogram: 06/2014 Last pap smear/pelvic exam: 2014, + HPV, unknown if high risk, will follow up OB/GYN DEXA:2009 will get at [facility] CXR 2013 MRI lumbar spine 2007  Prior vaccinations: TD or Tdap: 2013 Influenza: 2015 DUE Pneumococcal: 09/2013 Prevnar13: DUE  Shingles/Zostavax: check price  Last Dental Exam: Renold Don Last Eye Exam:[place], DUE, last OV 3 years ago Patient Care Team: Unk Pinto, MD as PCP - General (Internal Medicine) Richmond Campbell, MD as Consulting Physician (Gastroenterology) Druscilla Brownie, MD as Consulting Physician (Dermatology) Newman Pies,  MD as Consulting Physician (Neurosurgery)  Allergies:  Allergies  Allergen Reactions  . Doxycycline   . Erythromycin   . Flagyl [Metronidazole]   . Penicillins   . Sulfa Antibiotics    Medical History:  Past Medical History  Diagnosis Date  . Rosacea   . Anxiety   . Depression   . Diverticulitis   . Ischemic colitis   . Mixed hyperlipidemia 10/05/2013   Surgical History:  Past Surgical History  Procedure Laterality Date  . Abdominal hysterectomy    . Tubal ligation    . Lumbar laminectomy  2000  . Carpal tunnel release Right 2004  . Appendectomy     Family History:  Family History  Problem Relation Age of Onset  . Hypertension Mother   . Stroke Mother   . Heart disease Mother   . Diabetes Father   . Alcohol abuse Father   . Cancer Father     prostate, lung  . Diabetes Sister    Social History:  Social History  Substance Use Topics  . Smoking status: Former Smoker    Quit date: 10/04/2008  . Smokeless tobacco: Never Used  . Alcohol Use: No    Review of Systems: Review of Systems  Constitutional: Negative.   HENT: Positive for congestion.  Negative for ear discharge, ear pain, hearing loss, nosebleeds, sore throat and tinnitus.   Eyes: Negative.   Respiratory: Negative.  Negative for stridor.   Cardiovascular: Negative.   Gastrointestinal: Negative.   Genitourinary: Negative.   Musculoskeletal: Positive for back pain. Negative for myalgias, joint pain, falls and neck pain.  Skin: Negative.   Neurological: Positive for sensory change (from back, right leg). Negative for dizziness, tingling, tremors, speech change, focal weakness, seizures, loss of consciousness and headaches.  Endo/Heme/Allergies: Negative.   Psychiatric/Behavioral: Positive for memory loss. Negative for depression, suicidal ideas, hallucinations and substance abuse. The patient is not nervous/anxious and does not have insomnia.     Physical Exam: Estimated body mass index is 25.56  kg/(m^2) as calculated from the following:   Height as of this encounter: 5\' 4"  (1.626 m).   Weight as of this encounter: 149 lb (67.586 kg). BP 130/70 mmHg  Pulse 71  Temp(Src) 97 F (36.1 C) (Temporal)  Resp 18  Ht 5\' 4"  (1.626 m)  Wt 149 lb (67.586 kg)  BMI 25.56 kg/m2 General Appearance: Well nourished, in no apparent distress.  Eyes: PERRLA, EOMs, conjunctiva no swelling or erythema, normal fundi and vessels.  Sinuses: No Frontal/maxillary tenderness  ENT/Mouth: Ext aud canals clear, normal light reflex with TMs without erythema, bulging. Good dentition. No erythema, swelling, or exudate on post pharynx. Tonsils not swollen or erythematous. Hearing normal.  Neck: Supple, thyroid normal. No bruits  Respiratory: Respiratory effort normal, BS equal bilaterally without rales, rhonchi, wheezing or stridor.  Cardio: RRR without murmurs, rubs or gallops. Brisk peripheral pulses without edema.  Chest: symmetric, with normal excursions and percussion.  Breasts: Symmetric, without lumps, nipple discharge, retractions.  Abdomen: Soft, nontender, no guarding, rebound, hernias, masses, or organomegaly.  Lymphatics: Non tender without lymphadenopathy.  Genitourinary: defer Musculoskeletal: Full ROM all peripheral extremities,5/5 strength, and normal gait.  Skin: Warm, dry without rashes, lesions, ecchymosis. Neuro: Cranial nerves intact, reflexes equal bilaterally. Normal muscle tone, no cerebellar symptoms. Sensation intact.  Psych: Awake and oriented X 3, normal affect, Insight and Judgment appropriate.   EKG: WNL, no ST changes AORTA SCAN: defer  Vicie Mutters 10:36 AM [facility]

## 2015-05-10 ENCOUNTER — Other Ambulatory Visit: Payer: Self-pay | Admitting: Physician Assistant

## 2015-05-10 DIAGNOSIS — Z1231 Encounter for screening mammogram for malignant neoplasm of breast: Secondary | ICD-10-CM

## 2015-05-10 LAB — IRON AND TIBC
%SAT: 25 % (ref 11–50)
Iron: 100 ug/dL (ref 45–160)
TIBC: 396 ug/dL (ref 250–450)
UIBC: 296 ug/dL (ref 125–400)

## 2015-05-10 LAB — LIPID PANEL
Cholesterol: 243 mg/dL — ABNORMAL HIGH (ref 125–200)
HDL: 48 mg/dL (ref >=46)
LDL CALC: 150 mg/dL — AB (ref <130)
Total CHOL/HDL Ratio: 5.1 Ratio — ABNORMAL HIGH (ref <=5.0)
Triglycerides: 225 mg/dL — ABNORMAL HIGH (ref <150)
VLDL: 45 mg/dL — ABNORMAL HIGH (ref <30)

## 2015-05-10 LAB — URINALYSIS, ROUTINE W REFLEX MICROSCOPIC
BILIRUBIN URINE: NEGATIVE
Glucose, UA: NEGATIVE
HGB URINE DIPSTICK: NEGATIVE
KETONES UR: NEGATIVE
Leukocytes, UA: NEGATIVE
NITRITE: NEGATIVE
Protein, ur: NEGATIVE
SPECIFIC GRAVITY, URINE: 1.007 (ref 1.001–1.035)
pH: 7 (ref 5.0–8.0)

## 2015-05-10 LAB — HEPATIC FUNCTION PANEL
ALT: 22 U/L (ref 6–29)
AST: 18 U/L (ref 10–35)
Albumin: 4.6 g/dL (ref 3.6–5.1)
Alkaline Phosphatase: 46 U/L (ref 33–130)
BILIRUBIN TOTAL: 0.6 mg/dL (ref 0.2–1.2)
Bilirubin, Direct: 0.1 mg/dL (ref <=0.2)
Indirect Bilirubin: 0.5 mg/dL (ref 0.2–1.2)
TOTAL PROTEIN: 7.1 g/dL (ref 6.1–8.1)

## 2015-05-10 LAB — MICROALBUMIN / CREATININE URINE RATIO
CREATININE, URINE: 45.5 mg/dL
MICROALB UR: 0.2 mg/dL (ref <2.0)
Microalb Creat Ratio: 4.4 mg/g (ref 0.0–30.0)

## 2015-05-10 LAB — MAGNESIUM: Magnesium: 1.9 mg/dL (ref 1.5–2.5)

## 2015-05-10 LAB — VITAMIN B12: VITAMIN B 12: 507 pg/mL (ref 211–911)

## 2015-05-10 LAB — BASIC METABOLIC PANEL WITH GFR
BUN: 10 mg/dL (ref 7–25)
CO2: 26 mmol/L (ref 20–31)
CREATININE: 0.79 mg/dL (ref 0.50–0.99)
Calcium: 9.9 mg/dL (ref 8.6–10.4)
Chloride: 104 mmol/L (ref 98–110)
GFR, Est African American: >89 mL/min (ref >=60)
GFR, Est Non African American: 79 mL/min (ref >=60)
Glucose, Bld: 90 mg/dL (ref 65–99)
Potassium: 4.3 mmol/L (ref 3.5–5.3)
Sodium: 142 mmol/L (ref 135–146)

## 2015-05-10 LAB — HEMOGLOBIN A1C
HEMOGLOBIN A1C: 5.7 % — AB (ref <5.7)
MEAN PLASMA GLUCOSE: 117 mg/dL — AB (ref <117)

## 2015-05-10 LAB — FERRITIN: FERRITIN: 63 ng/mL (ref 10–291)

## 2015-05-10 LAB — VITAMIN D 25 HYDROXY (VIT D DEFICIENCY, FRACTURES): VIT D 25 HYDROXY: 81 ng/mL (ref 30–100)

## 2015-05-10 LAB — TSH: TSH: 3.799 u[IU]/mL (ref 0.350–4.500)

## 2015-05-10 LAB — INSULIN, FASTING: INSULIN FASTING, SERUM: 14.5 u[IU]/mL (ref 2.0–19.6)

## 2015-06-15 ENCOUNTER — Other Ambulatory Visit: Payer: Self-pay | Admitting: Internal Medicine

## 2015-06-23 ENCOUNTER — Ambulatory Visit
Admission: RE | Admit: 2015-06-23 | Discharge: 2015-06-23 | Disposition: A | Discharge status: Home / Self Care | Payer: Medicare Other | Source: Ambulatory Visit | Attending: Physician Assistant | Admitting: Physician Assistant

## 2015-06-23 DIAGNOSIS — Z1231 Encounter for screening mammogram for malignant neoplasm of breast: Secondary | ICD-10-CM

## 2015-06-23 DIAGNOSIS — Z0001 Encounter for general adult medical examination with abnormal findings: Secondary | ICD-10-CM

## 2015-06-23 DIAGNOSIS — E2839 Other primary ovarian failure: Secondary | ICD-10-CM

## 2015-06-23 DIAGNOSIS — Z13820 Encounter for screening for osteoporosis: Secondary | ICD-10-CM | POA: Diagnosis not present

## 2015-06-27 DIAGNOSIS — L821 Other seborrheic keratosis: Secondary | ICD-10-CM | POA: Diagnosis not present

## 2015-06-27 DIAGNOSIS — L718 Other rosacea: Secondary | ICD-10-CM | POA: Diagnosis not present

## 2015-06-27 DIAGNOSIS — L723 Sebaceous cyst: Secondary | ICD-10-CM | POA: Diagnosis not present

## 2015-08-28 ENCOUNTER — Encounter: Payer: Self-pay | Admitting: Gastroenterology

## 2015-10-25 ENCOUNTER — Telehealth: Payer: Self-pay | Admitting: *Deleted

## 2015-10-25 NOTE — Telephone Encounter (Signed)
Patient called and states she 3 teeth extracted last week.  She called her dentist and complained of pain,  He gave her Cephalexin 500 mg QID x 8 days.  The patient is concerned about taking them due to her history of colitis and medication allergies. Per Dr Melford Aase, it is OK to take the Cephalexin and patient aware of the advise.

## 2015-10-30 ENCOUNTER — Other Ambulatory Visit: Payer: Self-pay | Admitting: Internal Medicine

## 2015-11-16 ENCOUNTER — Encounter: Payer: Self-pay | Admitting: Internal Medicine

## 2015-11-16 ENCOUNTER — Ambulatory Visit (INDEPENDENT_AMBULATORY_CARE_PROVIDER_SITE_OTHER): Payer: Medicare Other | Admitting: Internal Medicine

## 2015-11-16 VITALS — BP 124/76 | HR 72 | Temp 97.8°F | Resp 16 | Ht 64.0 in | Wt 148.2 lb

## 2015-11-16 DIAGNOSIS — R030 Elevated blood-pressure reading, without diagnosis of hypertension: Secondary | ICD-10-CM

## 2015-11-16 DIAGNOSIS — I10 Essential (primary) hypertension: Secondary | ICD-10-CM | POA: Insufficient documentation

## 2015-11-16 DIAGNOSIS — R7309 Other abnormal glucose: Secondary | ICD-10-CM | POA: Diagnosis not present

## 2015-11-16 DIAGNOSIS — R7303 Prediabetes: Secondary | ICD-10-CM | POA: Diagnosis not present

## 2015-11-16 DIAGNOSIS — E782 Mixed hyperlipidemia: Secondary | ICD-10-CM

## 2015-11-16 DIAGNOSIS — E559 Vitamin D deficiency, unspecified: Secondary | ICD-10-CM | POA: Diagnosis not present

## 2015-11-16 DIAGNOSIS — IMO0001 Reserved for inherently not codable concepts without codable children: Secondary | ICD-10-CM

## 2015-11-16 DIAGNOSIS — Z79899 Other long term (current) drug therapy: Secondary | ICD-10-CM | POA: Insufficient documentation

## 2015-11-16 LAB — CBC WITH DIFFERENTIAL/PLATELET
BASOS PCT: 0 %
Basophils Absolute: 0 cells/uL (ref 0–200)
EOS ABS: 184 {cells}/uL (ref 15–500)
Eosinophils Relative: 4 %
HCT: 37.7 % (ref 35.0–45.0)
Hemoglobin: 12.6 g/dL (ref 11.7–15.5)
LYMPHS PCT: 51 %
Lymphs Abs: 2346 cells/uL (ref 850–3900)
MCH: 29.5 pg (ref 27.0–33.0)
MCHC: 33.4 g/dL (ref 32.0–36.0)
MCV: 88.3 fL (ref 80.0–100.0)
MONOS PCT: 5 %
MPV: 9.2 fL (ref 7.5–12.5)
Monocytes Absolute: 230 cells/uL (ref 200–950)
NEUTROS PCT: 40 %
Neutro Abs: 1840 cells/uL (ref 1500–7800)
PLATELETS: 274 10*3/uL (ref 140–400)
RBC: 4.27 MIL/uL (ref 3.80–5.10)
RDW: 14 % (ref 11.0–15.0)
WBC: 4.6 10*3/uL (ref 3.8–10.8)

## 2015-11-16 LAB — TSH: TSH: 2.94 mIU/L

## 2015-11-16 LAB — BASIC METABOLIC PANEL WITH GFR
BUN: 11 mg/dL (ref 7–25)
CALCIUM: 9.3 mg/dL (ref 8.6–10.4)
CO2: 26 mmol/L (ref 20–31)
Chloride: 102 mmol/L (ref 98–110)
Creat: 0.79 mg/dL (ref 0.50–0.99)
GFR, Est Non African American: 78 mL/min (ref >=60)
Glucose, Bld: 113 mg/dL — ABNORMAL HIGH (ref 65–99)
POTASSIUM: 4.1 mmol/L (ref 3.5–5.3)
Sodium: 140 mmol/L (ref 135–146)

## 2015-11-16 LAB — HEPATIC FUNCTION PANEL
ALK PHOS: 50 U/L (ref 33–130)
ALT: 24 U/L (ref 6–29)
AST: 21 U/L (ref 10–35)
Albumin: 4.4 g/dL (ref 3.6–5.1)
BILIRUBIN DIRECT: 0.1 mg/dL (ref <=0.2)
BILIRUBIN INDIRECT: 0.7 mg/dL (ref 0.2–1.2)
BILIRUBIN TOTAL: 0.8 mg/dL (ref 0.2–1.2)
Total Protein: 7 g/dL (ref 6.1–8.1)

## 2015-11-16 LAB — LIPID PANEL
CHOLESTEROL: 208 mg/dL — AB (ref 125–200)
HDL: 49 mg/dL (ref >=46)
LDL CALC: 118 mg/dL (ref <130)
TRIGLYCERIDES: 205 mg/dL — AB (ref <150)
Total CHOL/HDL Ratio: 4.2 Ratio (ref <=5.0)
VLDL: 41 mg/dL — AB (ref <30)

## 2015-11-16 LAB — MAGNESIUM: Magnesium: 1.9 mg/dL (ref 1.5–2.5)

## 2015-11-16 NOTE — Patient Instructions (Signed)

## 2015-11-16 NOTE — Progress Notes (Signed)
Patient ID: Kathryn Lamb, female   DOB: 12-May-1951, 65 y.o.   MRN: [id number]     This very nice 65 y.o. MWF presents for 3 month follow up with Hypertension, Hyperlipidemia, Pre-Diabetes and Vitamin D Deficiency.      Patient is monitored expectantly for elevated BP & BP has been controlled at home. Today's BP: 124/76 mmHg. Patient has had no complaints of any cardiac type chest pain, palpitations, dyspnea/orthopnea/PND, dizziness, claudication, or dependent edema.     Hyperlipidemia is controlled with diet, supplements & meds. Patient denies myalgias or other med SE's. Last Lipids were not at goal  Cholesterol 243*; HDL 48; LDL 150*; Triglycerides 225 on 05/09/2015 and patient is uncertain if she was taking her Atorvastatin at that time.      Also, the patient has history of PreDiabetes and has had no symptoms of reactive hypoglycemia, diabetic polys, paresthesias or visual blurring.  Last A1c was  5.7% on 05/09/2015.     Further, the patient also has history of Vitamin D Deficiency and supplements vitamin D without any suspected side-effects. Last vitamin D was 81 on 05/09/2015.  Medication Sig  . Atorvastatin 80 MG tablet Take 1 tablet by mouth  daily  . buPROPion- XL 300 MG 24 hr tablet Take 1 tablet by mouth  daily  . Calcium 1200 &-Vit D-Minerals Take 1,200 mg by mouth daily.  . Cetirizine 5 MG tablet Take 5 mg by mouth daily.  Marland Kitchen VITAMIN D 2000 UNITS tablet Take 2,000 Units by mouth daily.  . Coenzyme Q10 100 MG CAPS Take 100 mg by mouth daily. - advised OK to stop CoQ10  . FLAX SEED OIL 1000 MG CAPS Take 2,600 mg by mouth daily.   Marland Kitchen gabapentin  100 MG capsule Take 2 capsules by mouth 3  times daily  . MULTIVITAMIN WITH MINERALS Take 1 tablet by mouth. Takes 3 times per week. M,W,F  . Omega-3 FISH OIL 1000 MG CAPS Take 3,000 mg by mouth daily.  Marland Kitchen PROBIOTIC DAILY Take by mouth daily.   Allergies  Allergen Reactions  . Doxycycline   . Erythromycin   . Flagyl [Metronidazole]   .  Penicillins   . Sulfa Antibiotics    PMHx:   Past Medical History  Diagnosis Date  . Rosacea   . Anxiety   . Depression   . Diverticulitis   . Ischemic colitis ([place])   . Mixed hyperlipidemia 10/05/2013   Immunization History  Administered Date(s) Administered  . Influenza Split 05/17/2013, 05/17/2014  . Influenza, High Dose Seasonal PF 05/09/2015  . PPD Test 05/04/2014  . Pneumococcal Conjugate-13 05/09/2015  . Pneumococcal Polysaccharide-23 10/04/2013  . Td 08/30/2002  . Tdap 04/07/2012   Past Surgical History  Procedure Laterality Date  . Abdominal hysterectomy    . Tubal ligation    . Lumbar laminectomy  2000  . Carpal tunnel release Right 2004  . Appendectomy     FHx:    Reviewed / unchanged  SHx:    Reviewed / unchanged  Systems Review:  Constitutional: Denies fever, chills, wt changes, headaches, insomnia, fatigue, night sweats, change in appetite. Eyes: Denies redness, blurred vision, diplopia, discharge, itchy, watery eyes.  ENT: Denies discharge, congestion, post nasal drip, epistaxis, sore throat, earache, hearing loss, dental pain, tinnitus, vertigo, sinus pain, snoring.  CV: Denies chest pain, palpitations, irregular heartbeat, syncope, dyspnea, diaphoresis, orthopnea, PND, claudication or edema. Respiratory: denies cough, dyspnea, DOE, pleurisy, hoarseness, laryngitis, wheezing.  Gastrointestinal: Denies dysphagia, odynophagia, heartburn, reflux, water  brash, abdominal pain or cramps, nausea, vomiting, bloating, diarrhea, constipation, hematemesis, melena, hematochezia  or hemorrhoids. Genitourinary: Denies dysuria, frequency, urgency, nocturia, hesitancy, discharge, hematuria or flank pain. Musculoskeletal: Denies arthralgias, myalgias, stiffness, jt. swelling, pain, limping or strain/sprain.  Skin: Denies pruritus, rash, hives, warts, acne, eczema or change in skin lesion(s). Neuro: No weakness, tremor, incoordination, spasms, paresthesia or  pain. Psychiatric: Denies confusion, memory loss or sensory loss. Endo: Denies change in weight, skin or hair change.  Heme/Lymph: No excessive bleeding, bruising or enlarged lymph nodes.  Physical Exam  BP 124/76 mmHg  Pulse 72  Temp(Src) 97.8 F (36.6 C)  Resp 16  Ht 5\' 4"  (1.626 m)  Wt 148 lb 3.2 oz (67.223 kg)  BMI 25.43 kg/m2  Appears well nourished and in no distress. Eyes: PERRLA, EOMs, conjunctiva no swelling or erythema. Sinuses: No frontal/maxillary tenderness ENT/Mouth: EAC's clear, TM's nl w/o erythema, bulging. Nares clear w/o erythema, swelling, exudates. Oropharynx clear without erythema or exudates. Oral hygiene is good. Tongue normal, non obstructing. Hearing intact.  Neck: Supple. Thyroid nl. Car 2+/2+ without bruits, nodes or JVD. Chest: Respirations nl with BS clear & equal w/o rales, rhonchi, wheezing or stridor.  Cor: Heart sounds normal w/ regular rate and rhythm without sig. murmurs, gallops, clicks, or rubs. Peripheral pulses normal and equal  without edema.  Abdomen: Soft & bowel sounds normal. Non-tender w/o guarding, rebound, hernias, masses, or organomegaly.  Lymphatics: Unremarkable.  Musculoskeletal: Full ROM all peripheral extremities, joint stability, 5/5 strength, and normal gait.  Skin: Warm, dry without exposed rashes, lesions or ecchymosis apparent.  Neuro: Cranial nerves intact, reflexes equal bilaterally. Sensory-motor testing grossly intact. Tendon reflexes grossly intact.  Pysch: Alert & oriented x 3.  Insight and judgement nl & appropriate. No ideations.  Assessment and Plan:  1. Elevated BP  - TSH  2. Mixed hyperlipidemia  - Lipid panel - TSH  3. Prediabetes  - Hemoglobin A1c - Insulin, random  4. Vitamin D deficiency  - VITAMIN D 25 Hydroxy   5. Medication management  - CBC with Differential/Platelet - BASIC METABOLIC PANEL WITH GFR - Hepatic function panel - Magnesium   Recommended regular exercise, BP monitoring,  weight control, and discussed med and SE's. Recommended labs to assess and monitor clinical status. Further disposition pending results of labs. Over 30 minutes of exam, counseling, chart review was performed

## 2015-11-17 LAB — HEMOGLOBIN A1C
Hgb A1c MFr Bld: 5.8 % — ABNORMAL HIGH (ref <5.7)
Mean Plasma Glucose: 120 mg/dL

## 2015-11-17 LAB — VITAMIN D 25 HYDROXY (VIT D DEFICIENCY, FRACTURES): Vit D, 25-Hydroxy: 84 ng/mL (ref 30–100)

## 2015-11-17 LAB — INSULIN, RANDOM: Insulin: 35 u[IU]/mL — ABNORMAL HIGH (ref 2.0–19.6)

## 2016-01-25 ENCOUNTER — Other Ambulatory Visit: Payer: Self-pay | Admitting: Internal Medicine

## 2016-02-26 ENCOUNTER — Ambulatory Visit: Payer: Self-pay | Admitting: Internal Medicine

## 2016-03-05 ENCOUNTER — Ambulatory Visit: Payer: Self-pay | Admitting: Internal Medicine

## 2016-03-15 ENCOUNTER — Ambulatory Visit (INDEPENDENT_AMBULATORY_CARE_PROVIDER_SITE_OTHER): Payer: Medicare Other | Admitting: Physician Assistant

## 2016-03-15 ENCOUNTER — Encounter: Payer: Self-pay | Admitting: Physician Assistant

## 2016-03-15 DIAGNOSIS — E782 Mixed hyperlipidemia: Secondary | ICD-10-CM

## 2016-03-15 DIAGNOSIS — L719 Rosacea, unspecified: Secondary | ICD-10-CM

## 2016-03-15 DIAGNOSIS — Z79899 Other long term (current) drug therapy: Secondary | ICD-10-CM | POA: Diagnosis not present

## 2016-03-15 DIAGNOSIS — Z23 Encounter for immunization: Secondary | ICD-10-CM

## 2016-03-15 DIAGNOSIS — M5441 Lumbago with sciatica, right side: Secondary | ICD-10-CM

## 2016-03-15 DIAGNOSIS — R7303 Prediabetes: Secondary | ICD-10-CM

## 2016-03-15 DIAGNOSIS — M5442 Lumbago with sciatica, left side: Secondary | ICD-10-CM

## 2016-03-15 DIAGNOSIS — R030 Elevated blood-pressure reading, without diagnosis of hypertension: Secondary | ICD-10-CM

## 2016-03-15 DIAGNOSIS — IMO0001 Reserved for inherently not codable concepts without codable children: Secondary | ICD-10-CM

## 2016-03-15 DIAGNOSIS — E559 Vitamin D deficiency, unspecified: Secondary | ICD-10-CM

## 2016-03-15 DIAGNOSIS — F3342 Major depressive disorder, recurrent, in full remission: Secondary | ICD-10-CM | POA: Diagnosis not present

## 2016-03-15 DIAGNOSIS — K559 Vascular disorder of intestine, unspecified: Secondary | ICD-10-CM | POA: Diagnosis not present

## 2016-03-15 DIAGNOSIS — F419 Anxiety disorder, unspecified: Secondary | ICD-10-CM

## 2016-03-15 DIAGNOSIS — Z0001 Encounter for general adult medical examination with abnormal findings: Secondary | ICD-10-CM | POA: Diagnosis not present

## 2016-03-15 DIAGNOSIS — R6889 Other general symptoms and signs: Secondary | ICD-10-CM

## 2016-03-15 DIAGNOSIS — Z0000 Encounter for general adult medical examination without abnormal findings: Secondary | ICD-10-CM

## 2016-03-15 LAB — CBC WITH DIFFERENTIAL/PLATELET
BASOS ABS: 0 {cells}/uL (ref 0–200)
Basophils Relative: 0 %
EOS ABS: 144 {cells}/uL (ref 15–500)
Eosinophils Relative: 3 %
HEMATOCRIT: 39 % (ref 35.0–45.0)
HEMOGLOBIN: 13 g/dL (ref 11.7–15.5)
LYMPHS ABS: 2208 {cells}/uL (ref 850–3900)
Lymphocytes Relative: 46 %
MCH: 30.2 pg (ref 27.0–33.0)
MCHC: 33.3 g/dL (ref 32.0–36.0)
MCV: 90.5 fL (ref 80.0–100.0)
MONO ABS: 240 {cells}/uL (ref 200–950)
MPV: 9.2 fL (ref 7.5–12.5)
Monocytes Relative: 5 %
NEUTROS PCT: 46 %
Neutro Abs: 2208 cells/uL (ref 1500–7800)
PLATELETS: 273 10*3/uL (ref 140–400)
RBC: 4.31 MIL/uL (ref 3.80–5.10)
RDW: 13.6 % (ref 11.0–15.0)
WBC: 4.8 10*3/uL (ref 3.8–10.8)

## 2016-03-15 LAB — BASIC METABOLIC PANEL WITH GFR
BUN: 11 mg/dL (ref 7–25)
CALCIUM: 10.4 mg/dL (ref 8.6–10.4)
CO2: 27 mmol/L (ref 20–31)
CREATININE: 0.88 mg/dL (ref 0.50–0.99)
Chloride: 102 mmol/L (ref 98–110)
GFR, Est African American: 79 mL/min (ref >=60)
GFR, Est Non African American: 69 mL/min (ref >=60)
Glucose, Bld: 86 mg/dL (ref 65–99)
Potassium: 4 mmol/L (ref 3.5–5.3)
SODIUM: 140 mmol/L (ref 135–146)

## 2016-03-15 LAB — HEPATIC FUNCTION PANEL
ALT: 24 U/L (ref 6–29)
AST: 18 U/L (ref 10–35)
Albumin: 4.7 g/dL (ref 3.6–5.1)
Alkaline Phosphatase: 42 U/L (ref 33–130)
BILIRUBIN DIRECT: 0.1 mg/dL (ref <=0.2)
BILIRUBIN TOTAL: 0.9 mg/dL (ref 0.2–1.2)
Indirect Bilirubin: 0.8 mg/dL (ref 0.2–1.2)
Total Protein: 7.3 g/dL (ref 6.1–8.1)

## 2016-03-15 LAB — HEMOGLOBIN A1C
Hgb A1c MFr Bld: 5.5 % (ref <5.7)
MEAN PLASMA GLUCOSE: 111 mg/dL

## 2016-03-15 LAB — TSH: TSH: 2.95 m[IU]/L

## 2016-03-15 LAB — LIPID PANEL
CHOL/HDL RATIO: 4 ratio (ref <=5.0)
CHOLESTEROL: 208 mg/dL — AB (ref 125–200)
HDL: 52 mg/dL (ref >=46)
LDL Cholesterol: 107 mg/dL (ref <130)
Triglycerides: 244 mg/dL — ABNORMAL HIGH (ref <150)
VLDL: 49 mg/dL — ABNORMAL HIGH (ref <30)

## 2016-03-15 NOTE — Progress Notes (Signed)
MEDICARE WELLNESS VISIT AND FOLLOW UP  Assessment:   Mixed hyperlipidemia -continue medications, check lipids, decrease fatty foods, increase activity.  - Lipid panel   Prediabetes Discussed general issues about diabetes pathophysiology and management., Educational material distributed., Suggested low cholesterol diet., Encouraged aerobic exercise., Discussed foot care., Reminded to get yearly retinal exam.  Depression, remission Depression- continue medications, stress management techniques discussed, increase water, good sleep hygiene discussed, increase exercise, and increase veggies.   Anxiety controlled   Rosacea Follows with Derm  Ischemic colitis Controlled, avoid triggers, continue follow up GI  Vitamin D deficiency Continue supplement  Medication management - Magnesium  Bilateral low back pain with sciatica, sciatica laterality unspecified Controlled, continue gabapentin PRN  Elevated blood pressure reading without diagnosis of hypertension - CBC with Differential/Platelet - BASIC METABOLIC PANEL WITH GFR - Hepatic function panel - TSH  Need for Shingles vaccine Given today  Over 30 minutes of exam, counseling, chart review, and critical decision making was performed  Plan:   During the course of the visit the patient was educated and counseled about appropriate screening and preventive services including:    Pneumococcal vaccine   Influenza vaccine  Td vaccine  Screening electrocardiogram  Screening mammography  Bone densitometry screening  Colorectal cancer screening  Diabetes screening  Glaucoma screening  Nutrition counseling   Advanced directives: given info/requested   Subjective:   Kathryn Lamb is a 65 y.o. female who presents for Welcome to Medicare Visit and 3 month follow up on hypertension, prediabetes, hyperlipidemia, vitamin D def.   Her blood pressure has been controlled at home, today their BP is BP: (P) 124/68 She  does workout. She denies chest pain, shortness of breath, dizziness.  She is on cholesterol medication, crestor 10mg  3 days a week and 5 mg the other daysand denies myalgias. Her cholesterol is at goal. The cholesterol last visit was:   Lab Results  Component Value Date   CHOL 208 (H) 11/16/2015   HDL 49 11/16/2015   LDLCALC 118 11/16/2015   TRIG 205 (H) 11/16/2015   CHOLHDL 4.2 11/16/2015   She has been working on diet and exercise for prediabetes, and denies paresthesia of the feet, polydipsia, polyuria and visual disturbances. Last A1C in the office was:  Lab Results  Component Value Date   HGBA1C 5.8 (H) 11/16/2015   Patient is on Vitamin D supplement. Lab Results  Component Value Date   VD25OH 24 11/16/2015     She is on wellbutrin XL 300mg  for depression and anxiety which helps, she states that her memory is unchanged, she is fostering 5 kittens that are 4 month. She is on zyrtec for allergies.  She has lower back pain with occ pain in her legs and is on gabapentin for this which helps.  BMI is Body mass index is 24.43 kg/m (pended)., she is working on diet and exercise. Wt Readings from Last 3 Encounters:  03/15/16 (P) 142 lb 4.8 oz (64.5 kg)  11/16/15 148 lb 3.2 oz (67.2 kg)  05/09/15 149 lb (67.6 kg)    Medication Review Current Outpatient Prescriptions on File Prior to Visit  Medication Sig Dispense Refill  . atorvastatin (LIPITOR) 80 MG tablet Take 1 tablet by mouth  daily 90 tablet 2  . buPROPion (WELLBUTRIN XL) 300 MG 24 hr tablet Take 1 tablet by mouth  daily 90 tablet 1  . Calcium Carbonate-Vit D-Min (CALCIUM 1200 PO) Take 1,200 mg by mouth daily.    . cetirizine (ZYRTEC) 5 MG  tablet Take 5 mg by mouth daily.    . Cholecalciferol (VITAMIN D) 2000 UNITS tablet Take 2,000 Units by mouth daily.    . Flaxseed, Linseed, (FLAX SEED OIL) 1000 MG CAPS Take 2,600 mg by mouth daily.     Marland Kitchen gabapentin (NEURONTIN) 100 MG capsule Take 2 capsules by mouth 3  times daily 540  capsule 1  . Multiple Vitamin (MULTIVITAMIN WITH MINERALS) TABS tablet Take 1 tablet by mouth. Takes 3 times per week. M,W,F    . Omega-3 Fatty Acids (FISH OIL) 1000 MG CAPS Take 3,000 mg by mouth daily.    . Probiotic Product (PROBIOTIC DAILY PO) Take by mouth daily.     No current facility-administered medications on file prior to visit.     Current Problems (verified) Patient Active Problem List   Diagnosis Date Noted  . Encounter for Medicare annual wellness exam 03/15/2016  . Elevated BP 11/16/2015  . Vitamin D deficiency 11/16/2015  . Medication management 11/16/2015  . Lower back pain 01/10/2015  . Prediabetes 08/22/2014  . Mixed hyperlipidemia 10/05/2013  . Rosacea   . Anxiety   . 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 Ischemic colitis ([place])     Screening Tests Immunization History  Administered Date(s) Administered  . Influenza Split 05/17/2013, 05/17/2014  . Influenza, High Dose Seasonal PF 05/09/2015  . PPD Test 05/04/2014  . Pneumococcal Conjugate-13 05/09/2015  . Pneumococcal Polysaccharide-23 10/04/2013  . Td 08/30/2002  . Tdap 04/07/2012   Preventative care: Last colonoscopy: 2013 Last mammogram: 06/2015 Last pap smear/pelvic exam: 2014 DEXA:2016 CXR 2013 MRI lumbar spine 2007  Prior vaccinations: TD or Tdap: 2013  Influenza: 2015 Pneumococcal: 09/2013 Prevnar13: 2016 Shingles/Zostavax: will get today   Names of Other Physician/Practitioners you currently use: 1. [facility]- here for primary care 2. Dr. Joya San, eye doctor, last visit 2017, glasses 3. Dr. Eula Listen, dentist, last visit May 2017 4. Laurin Coder, OB/GYN Patient Care Team: Unk Pinto, MD as PCP - General (Internal Medicine) Richmond Campbell, MD as Consulting Physician (Gastroenterology) Druscilla Brownie, MD as Consulting Physician (Dermatology) Newman Pies, MD as Consulting Physician (Neurosurgery)  Allergies Allergies   Allergen Reactions  . Doxycycline   . Erythromycin   . Flagyl [Metronidazole]   . Penicillins   . Sulfa Antibiotics     SURGICAL HISTORY She  has a past surgical history that includes Abdominal hysterectomy; Tubal ligation; Lumbar laminectomy (2000); Carpal tunnel release (Right, 2004); and Appendectomy. FAMILY HISTORY Her family history includes Alcohol abuse in her father; Cancer in her father; Diabetes in her father and sister; Heart disease in her mother; Hypertension in her mother; Stroke in her mother. SOCIAL HISTORY She  reports that she quit smoking about 7 years ago. She has never used smokeless tobacco. She reports that she does not drink alcohol or use drugs.  MEDICARE WELLNESS OBJECTIVES: Tobacco use: She does not smoke.  Patient is a former smoker. Alcohol Current alcohol use: none Diet: in general, a "healthy" diet   Physical activity: walking Depression/mood screen:   Depression screen PHQ 2/9 11/16/2015  Decreased Interest 0  Down, Depressed, Hopeless 0  PHQ - 2 Score 0   Hearing: normal Visual acuity: normal,  does not perform annual eye exam  ADLs:  In your present state of health, do you have any difficulty performing the following activities: 11/16/2015  Hearing? N  Vision? N  Difficulty concentrating or making decisions? N  Walking or climbing stairs? N  Dressing or bathing? N  Doing errands, shopping? N  Some recent data might be hidden    Fall risk: Low Risk EOL planning: Does patient have an advance directive?: No Would patient like information on creating an advanced directive?: Yes - Educational materials given   Review of Systems  Constitutional: Negative.   HENT: Negative.   Eyes: Negative.   Respiratory: Negative.   Cardiovascular: Negative.   Gastrointestinal: Negative.   Genitourinary: Negative.   Musculoskeletal: Negative.   Skin: Negative.   Neurological: Negative.   Endo/Heme/Allergies: Negative.   Psychiatric/Behavioral: Negative.       Objective:   Today's Vitals   03/15/16 1032  BP: (P) 124/68  Pulse: (P) 76  Resp: (P) 16  Temp: (P) 98.1 F (36.7 C)  TempSrc: (P) Temporal  SpO2: (P) 98%  Weight: (P) 142 lb 4.8 oz (64.5 kg)  Height: (P) 5\' 4"  (1.626 m)  PainSc: 2   PainLoc: Leg    General appearance: alert, no distress, WD/WN,  female HEENT: normocephalic, sclerae anicteric, TMs pearly, nares patent, no discharge or erythema, pharynx normal Oral cavity: MMM, no lesions Neck: supple, no lymphadenopathy, no thyromegaly, no masses Heart: RRR, normal S1, S2, no murmurs Lungs: CTA bilaterally, no wheezes, rhonchi, or rales Abdomen: +bs, soft, non tender, non distended, no masses, no hepatomegaly, no splenomegaly Musculoskeletal: nontender, no swelling, no obvious deformity Extremities: no edema, no cyanosis, no clubbing Pulses: 2+ symmetric, upper and lower extremities, normal cap refill Neurological: alert, oriented x 3, CN2-12 intact, strength normal upper extremities and lower extremities, sensation normal throughout, DTRs 2+ throughout, no cerebellar signs, gait normal Psychiatric: normal affect, behavior normal, pleasant  Breast: defer Gyn: defer Rectal: defer   Medicare Attestation I have personally reviewed: The patient's medical and social history Their use of alcohol, tobacco or illicit drugs Their current medications and supplements The patient's functional ability including ADLs,fall risks, home safety risks, cognitive, and hearing and visual impairment Diet and physical activities Evidence for depression or mood disorders  The patient's weight, height, BMI, and visual acuity have been recorded in the chart.  I have made referrals, counseling, and provided education to the patient based on review of the above and I have provided the patient with a written personalized care plan for preventive services.     Vicie Mutters, PA-C   03/15/2016

## 2016-03-15 NOTE — Patient Instructions (Addendum)
Please call GYN and ask if you need another pap based on previous paps  Shingles Vaccine: What You Need to Know WHAT IS SHINGLES?  Shingles is a painful skin rash, often with blisters. It is also called Herpes Zoster or just Zoster.  A shingles rash usually appears on one side of the face or body and lasts from 2 to 4 weeks. Its main symptom is pain, which can be quite severe. Other symptoms of shingles can include fever, headache, chills, and upset stomach. Very rarely, a shingles infection can lead to pneumonia, hearing problems, blindness, brain inflammation (encephalitis), or death.  For about 1 person in 5, severe pain can continue even after the rash clears up. This is called post-herpetic neuralgia.  Shingles is caused by the Varicella Zoster virus. This is the same virus that causes chickenpox. Only someone who has had a case of chickenpox or rarely, has gotten chickenpox vaccine, can get shingles. The virus stays in your body. It can reappear many years later to cause a case of shingles.  You cannot catch shingles from another person with shingles. However, a person who has never had chickenpox (or chickenpox vaccine) could get chickenpox from someone with shingles. This is not very common.  Shingles is far more common in people 34 and older than in younger people. It is also more common in people whose immune systems are weakened because of a disease such as cancer or drugs such as steroids or chemotherapy.  At least 1 million people get shingles per year in the [place]. SHINGLES VACCINE  A vaccine for shingles was licensed in 123456. In clinical trials, the vaccine reduced the risk of shingles by 50%. It can also reduce the pain in people who still get shingles after being vaccinated.  A single dose of shingles vaccine is recommended for adults 25 years of age and older. SOME PEOPLE SHOULD NOT GET SHINGLES VACCINE OR SHOULD WAIT A person should not get shingles vaccine if he or  she:  Has ever had a life-threatening allergic reaction to gelatin, the antibiotic neomycin, or any other component of shingles vaccine. Tell your caregiver if you have any severe allergies.  Has a weakened immune system because of current:  AIDS or another disease that affects the immune system.  Treatment with drugs that affect the immune system, such as prolonged use of high-dose steroids.  Cancer treatment, such as radiation or chemotherapy.  Cancer affecting the bone marrow or lymphatic system, such as leukemia or lymphoma.  Is pregnant, or might be pregnant. Women should not become pregnant until at least 4 weeks after getting shingles vaccine. Someone with a minor illness, such as a cold, may be vaccinated. Anyone with a moderate or severe acute illness should usually wait until he or she recovers before getting the vaccine. This includes anyone with a temperature of 101.3 F (38 C) or higher. WHAT ARE THE RISKS FROM SHINGLES VACCINE?  A vaccine, like any medicine, could possibly cause serious problems, such as severe allergic reactions. However, the risk of a vaccine causing serious harm, or death, is extremely small.  No serious problems have been identified with shingles vaccine. Mild Problems  Redness, soreness, swelling, or itching at the site of the injection (about 1 person in 3).  Headache (about 1 person in 65). Like all vaccines, shingles vaccine is being closely monitored for unusual or severe problems. WHAT IF THERE IS A MODERATE OR SEVERE REACTION? What should I look for? Any unusual condition,  such as a severe allergic reaction or a high fever. If a severe allergic reaction occurred, it would be within a few minutes to an hour after the shot. Signs of a serious allergic reaction can include difficulty breathing, weakness, hoarseness or wheezing, a fast heartbeat, hives, dizziness, paleness, or swelling of the throat. What should I do?  Call your caregiver, or get  the person to a caregiver right away.  Tell the caregiver what happened, the date and time it happened, and when the vaccination was given.  Ask the caregiver to report the reaction by filing a Vaccine Adverse Event Reporting System (VAERS) form. Or, you can file this report through the VAERS web site at www.vaers.SamedayNews.es or by calling 615-419-4808. VAERS does not provide medical advice. HOW CAN I LEARN MORE?  Ask your caregiver. He or she can give you the vaccine package insert or suggest other sources of information.  Contact the Centers for Disease Control and Prevention (CDC):  Call (223)755-3485 (1-800-CDC-INFO).  Visit the CDC website at http://hunter.com/ CDC Shingles Vaccine VIS (05/17/08)   This information is not intended to replace advice given to you by your health care provider. Make sure you discuss any questions you have with your health care provider.   Document Released: 05/26/2006 Document Revised: 12/13/2014 Document Reviewed: 11/18/2012 Elsevier Interactive Patient Education [employer].

## 2016-05-08 ENCOUNTER — Encounter: Payer: Self-pay | Admitting: Physician Assistant

## 2016-05-24 ENCOUNTER — Other Ambulatory Visit: Payer: Self-pay | Admitting: Physician Assistant

## 2016-06-04 ENCOUNTER — Encounter: Payer: Self-pay | Admitting: Physician Assistant

## 2016-06-11 ENCOUNTER — Other Ambulatory Visit: Payer: Self-pay | Admitting: Internal Medicine

## 2016-06-11 DIAGNOSIS — Z1231 Encounter for screening mammogram for malignant neoplasm of breast: Secondary | ICD-10-CM

## 2016-06-18 ENCOUNTER — Encounter: Payer: Self-pay | Admitting: Physician Assistant

## 2016-06-18 ENCOUNTER — Ambulatory Visit (INDEPENDENT_AMBULATORY_CARE_PROVIDER_SITE_OTHER): Payer: Medicare Other | Admitting: Physician Assistant

## 2016-06-18 VITALS — BP 124/76 | HR 64 | Temp 97.3°F | Resp 16 | Ht 63.0 in | Wt 140.0 lb

## 2016-06-18 DIAGNOSIS — Z1159 Encounter for screening for other viral diseases: Secondary | ICD-10-CM | POA: Diagnosis not present

## 2016-06-18 DIAGNOSIS — Z79899 Other long term (current) drug therapy: Secondary | ICD-10-CM

## 2016-06-18 DIAGNOSIS — F3342 Major depressive disorder, recurrent, in full remission: Secondary | ICD-10-CM

## 2016-06-18 DIAGNOSIS — R7303 Prediabetes: Secondary | ICD-10-CM

## 2016-06-18 DIAGNOSIS — Z23 Encounter for immunization: Secondary | ICD-10-CM

## 2016-06-18 DIAGNOSIS — R030 Elevated blood-pressure reading, without diagnosis of hypertension: Secondary | ICD-10-CM

## 2016-06-18 DIAGNOSIS — Z0000 Encounter for general adult medical examination without abnormal findings: Secondary | ICD-10-CM | POA: Diagnosis not present

## 2016-06-18 DIAGNOSIS — Z0001 Encounter for general adult medical examination with abnormal findings: Secondary | ICD-10-CM

## 2016-06-18 DIAGNOSIS — F419 Anxiety disorder, unspecified: Secondary | ICD-10-CM

## 2016-06-18 DIAGNOSIS — L719 Rosacea, unspecified: Secondary | ICD-10-CM

## 2016-06-18 DIAGNOSIS — K559 Vascular disorder of intestine, unspecified: Secondary | ICD-10-CM

## 2016-06-18 DIAGNOSIS — E782 Mixed hyperlipidemia: Secondary | ICD-10-CM | POA: Diagnosis not present

## 2016-06-18 DIAGNOSIS — E559 Vitamin D deficiency, unspecified: Secondary | ICD-10-CM

## 2016-06-18 LAB — BASIC METABOLIC PANEL WITH GFR
BUN: 14 mg/dL (ref 7–25)
CO2: 27 mmol/L (ref 20–31)
Calcium: 10.7 mg/dL — ABNORMAL HIGH (ref 8.6–10.4)
Chloride: 104 mmol/L (ref 98–110)
Creat: 0.89 mg/dL (ref 0.50–0.99)
GFR, EST AFRICAN AMERICAN: 78 mL/min (ref >=60)
GFR, EST NON AFRICAN AMERICAN: 68 mL/min (ref >=60)
GLUCOSE: 87 mg/dL (ref 65–99)
POTASSIUM: 4.6 mmol/L (ref 3.5–5.3)
Sodium: 144 mmol/L (ref 135–146)

## 2016-06-18 LAB — CBC WITH DIFFERENTIAL/PLATELET
BASOS PCT: 0 %
Basophils Absolute: 0 cells/uL (ref 0–200)
EOS ABS: 186 {cells}/uL (ref 15–500)
Eosinophils Relative: 3 %
HEMATOCRIT: 38.6 % (ref 35.0–45.0)
HEMOGLOBIN: 13 g/dL (ref 11.7–15.5)
LYMPHS ABS: 3038 {cells}/uL (ref 850–3900)
LYMPHS PCT: 49 %
MCH: 30.5 pg (ref 27.0–33.0)
MCHC: 33.7 g/dL (ref 32.0–36.0)
MCV: 90.6 fL (ref 80.0–100.0)
MONO ABS: 310 {cells}/uL (ref 200–950)
MPV: 8.8 fL (ref 7.5–12.5)
Monocytes Relative: 5 %
NEUTROS PCT: 43 %
Neutro Abs: 2666 cells/uL (ref 1500–7800)
Platelets: 280 10*3/uL (ref 140–400)
RBC: 4.26 MIL/uL (ref 3.80–5.10)
RDW: 13.1 % (ref 11.0–15.0)
WBC: 6.2 10*3/uL (ref 3.8–10.8)

## 2016-06-18 LAB — HEPATIC FUNCTION PANEL
ALBUMIN: 5 g/dL (ref 3.6–5.1)
ALK PHOS: 41 U/L (ref 33–130)
ALT: 20 U/L (ref 6–29)
AST: 20 U/L (ref 10–35)
BILIRUBIN INDIRECT: 0.5 mg/dL (ref 0.2–1.2)
Bilirubin, Direct: 0.1 mg/dL (ref <=0.2)
TOTAL PROTEIN: 7.3 g/dL (ref 6.1–8.1)
Total Bilirubin: 0.6 mg/dL (ref 0.2–1.2)

## 2016-06-18 LAB — MAGNESIUM: MAGNESIUM: 2.1 mg/dL (ref 1.5–2.5)

## 2016-06-18 LAB — LIPID PANEL
Cholesterol: 226 mg/dL — ABNORMAL HIGH (ref <200)
HDL: 53 mg/dL (ref >50)
LDL CALC: 130 mg/dL — AB
TRIGLYCERIDES: 217 mg/dL — AB (ref <150)
Total CHOL/HDL Ratio: 4.3 Ratio (ref <5.0)
VLDL: 43 mg/dL — AB (ref <30)

## 2016-06-18 LAB — TSH: TSH: 2.75 mIU/L

## 2016-06-18 NOTE — Patient Instructions (Addendum)
Add ENTERIC COATED low dose 81 mg Aspirin daily OR can do every other day if you have easy bruising to protect your heart and head. As well as to reduce risk of Colon Cancer by 20 %, Skin Cancer by 26 % , Melanoma by 46% and Pancreatic cancer by 60%  Can stop calcium continue vitamin D  Follow up with Derm about your lip

## 2016-06-18 NOTE — Progress Notes (Signed)
CPE AND FOLLOW UP  Assessment:   Mixed hyperlipidemia -continue medications, check lipids, decrease fatty foods, increase activity.  - Lipid panel   Prediabetes Discussed general issues about diabetes pathophysiology and management., Educational material distributed., Suggested low cholesterol diet., Encouraged aerobic exercise., Discussed foot care., Reminded to get yearly retinal exam.  Depression, remission Depression- continue medications, stress management techniques discussed, increase water, good sleep hygiene discussed, increase exercise, and increase veggies.   Anxiety controlled   Rosacea Follows with Derm  Ischemic colitis Controlled, avoid triggers, continue follow up GI  Vitamin D deficiency Continue supplement  Medication management - Magnesium  Bilateral low back pain with sciatica, sciatica laterality unspecified Controlled, continue gabapentin PRN  Elevated blood pressure reading without diagnosis of hypertension - CBC with Differential/Platelet - BASIC METABOLIC PANEL WITH GFR - Hepatic function panel - TSH  Encounter for general adult medical examination with abnormal findings -     CBC with Differential/Platelet -     BASIC METABOLIC PANEL WITH GFR -     Hepatic function panel -     TSH -     Lipid panel -     Magnesium -     Urinalysis, Routine w reflex microscopic (not at [facility]) -     Microalbumin / creatinine urine ratio -     Hepatitis C antibody  Screening for viral disease -     Hepatitis C antibody  Over 30 minutes of exam, counseling, chart review, and critical decision making was performed   Subjective:   Kathryn Lamb is a 65 y.o. female who presents for CPE and 3 month follow up on hypertension, prediabetes, hyperlipidemia, vitamin D def.   Her blood pressure has been controlled at home, today their BP is BP: 124/76 She does workout. She denies chest pain, shortness of breath, dizziness.  She is on cholesterol medication,  lipitor 80 1/2 x 4 days a week and denies myalgias. Her cholesterol is at goal. The cholesterol last visit was:   Lab Results  Component Value Date   CHOL 208 (H) 03/15/2016   HDL 52 03/15/2016   LDLCALC 107 03/15/2016   TRIG 244 (H) 03/15/2016   CHOLHDL 4.0 03/15/2016   She has been working on diet and exercise for prediabetes, and denies paresthesia of the feet, polydipsia, polyuria and visual disturbances. Last A1C in the office was:  Lab Results  Component Value Date   HGBA1C 5.5 03/15/2016   Patient is on Vitamin D supplement. Lab Results  Component Value Date   VD25OH 53 11/16/2015     She is on wellbutrin XL 300mg  for depression and anxiety which helps, she states that her memory is unchanged, she is fostering 5 kittens that are 4 month. She is on zyrtec for allergies.  She has lower back pain with occ pain in her legs and is on gabapentin for this which helps.  BMI is Body mass index is 24.8 kg/m., she is working on diet and exercise. Wt Readings from Last 3 Encounters:  06/18/16 140 lb (63.5 kg)  03/15/16 (P) 142 lb 4.8 oz (64.5 kg)  11/16/15 148 lb 3.2 oz (67.2 kg)    Medication Review Current Outpatient Prescriptions on File Prior to Visit  Medication Sig Dispense Refill  . atorvastatin (LIPITOR) 80 MG tablet Take 1 tablet by mouth  daily 90 tablet 2  . buPROPion (WELLBUTRIN XL) 300 MG 24 hr tablet TAKE 1 TABLET BY MOUTH  DAILY 90 tablet 0  . Calcium Carbonate-Vit D-Min (  CALCIUM 1200 PO) Take 1,200 mg by mouth daily.    . cetirizine (ZYRTEC) 5 MG tablet Take 5 mg by mouth daily.    . Cholecalciferol (VITAMIN D) 2000 UNITS tablet Take 2,000 Units by mouth daily.    . Flaxseed, Linseed, (FLAX SEED OIL) 1000 MG CAPS Take 2,600 mg by mouth daily.     Marland Kitchen gabapentin (NEURONTIN) 100 MG capsule TAKE 2 CAPSULES BY MOUTH 3  TIMES DAILY 540 capsule 0  . Multiple Vitamin (MULTIVITAMIN WITH MINERALS) TABS tablet Take 1 tablet by mouth. Takes 3 times per week. M,W,F    . Omega-3  Fatty Acids (FISH OIL) 1000 MG CAPS Take 3,000 mg by mouth daily.    . Probiotic Product (PROBIOTIC DAILY PO) Take by mouth daily.     No current facility-administered medications on file prior to visit.     Current Problems (verified) Patient Active Problem List   Diagnosis Date Noted  . Encounter for Medicare annual wellness exam 03/15/2016  . Elevated BP 11/16/2015  . Vitamin D deficiency 11/16/2015  . Medication management 11/16/2015  . Lower back pain 01/10/2015  . Prediabetes 08/22/2014  . Mixed hyperlipidemia 10/05/2013  . Rosacea   . Anxiety   . 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 Ischemic colitis ([place])     Screening Tests Immunization History  Administered Date(s) Administered  . Influenza Split 05/17/2013, 05/17/2014  . Influenza, High Dose Seasonal PF 05/09/2015, 06/18/2016  . PPD Test 05/04/2014  . Pneumococcal Conjugate-13 05/09/2015  . Pneumococcal Polysaccharide-23 10/04/2013  . Td 08/30/2002  . Tdap 04/07/2012  . Varicella Zoster Immune Globulin 03/15/2016   Preventative care: Last colonoscopy: 2013 Last mammogram: 06/2015 appt Dec 1st Last pap smear/pelvic exam: 2014 declines another DEXA:2016 due 2018 CXR 2013 MRI lumbar spine 2007  Prior vaccinations: TD or Tdap: 2013  Influenza: 2017 Pneumococcal: 09/2013 Prevnar13: 2016 Shingles/Zostavax: 2017   Names of Other Physician/Practitioners you currently use: 1. [facility]- here for primary care 2. Dr. Joya San, eye doctor, last visit 2017, glasses 3. Dr. Eula Listen, dentist, last visit May 2017 4. Laurin Coder, OB/GYN Patient Care Team: Unk Pinto, MD as PCP - General (Internal Medicine) Richmond Campbell, MD as Consulting Physician (Gastroenterology) Druscilla Brownie, MD as Consulting Physician (Dermatology) Newman Pies, MD as Consulting Physician (Neurosurgery)  Allergies Allergies  Allergen Reactions  . Doxycycline   . Erythromycin    . Flagyl [Metronidazole]   . Penicillins   . Sulfa Antibiotics     SURGICAL HISTORY She  has a past surgical history that includes Abdominal hysterectomy; Tubal ligation; Lumbar laminectomy (2000); Carpal tunnel release (Right, 2004); and Appendectomy. FAMILY HISTORY Her family history includes Alcohol abuse in her father; Cancer in her father; Diabetes in her father and sister; Heart disease in her mother; Hypertension in her mother; Stroke in her mother. SOCIAL HISTORY She  reports that she quit smoking about 7 years ago. She has never used smokeless tobacco. She reports that she does not drink alcohol or use drugs.   Review of Systems  Constitutional: Negative.   HENT: Negative.   Eyes: Negative.   Respiratory: Negative.   Cardiovascular: Negative.   Gastrointestinal: Negative.   Genitourinary: Negative.   Musculoskeletal: Negative.   Skin: Negative.   Neurological: Negative.   Endo/Heme/Allergies: Negative.   Psychiatric/Behavioral: Negative.      Objective:   Today's Vitals   06/18/16 1501  BP: 124/76  Pulse: 64  Resp: 16  Temp: 97.3 F (  36.3 C)  SpO2: 99%  Weight: 140 lb (63.5 kg)  Height: 5\' 3"  (1.6 m)    General appearance: alert, no distress, WD/WN,  female HEENT: normocephalic, sclerae anicteric, TMs pearly, nares patent, no discharge or erythema, pharynx normal Oral cavity: MMM, no lesions Neck: supple, no lymphadenopathy, no thyromegaly, no masses Heart: RRR, normal S1, S2, no murmurs Lungs: CTA bilaterally, no wheezes, rhonchi, or rales Abdomen: +bs, soft, non tender, non distended, no masses, no hepatomegaly, no splenomegaly Musculoskeletal: nontender, no swelling, no obvious deformity Extremities: no edema, no cyanosis, no clubbing Pulses: 2+ symmetric, upper and lower extremities, normal cap refill Neurological: alert, oriented x 3, CN2-12 intact, strength normal upper extremities and lower extremities, sensation normal throughout, DTRs 2+  throughout, no cerebellar signs, gait normal Psychiatric: normal affect, behavior normal, pleasant  Breast: defer Gyn: defer Rectal: defer    Vicie Mutters, PA-C   06/18/2016

## 2016-06-19 LAB — URINALYSIS, ROUTINE W REFLEX MICROSCOPIC
Bilirubin Urine: NEGATIVE
Glucose, UA: NEGATIVE
HGB URINE DIPSTICK: NEGATIVE
KETONES UR: NEGATIVE
LEUKOCYTES UA: NEGATIVE
NITRITE: NEGATIVE
PH: 7.5 (ref 5.0–8.0)
PROTEIN: NEGATIVE
Specific Gravity, Urine: 1.011 (ref 1.001–1.035)

## 2016-06-19 LAB — HEPATITIS C ANTIBODY: HCV Ab: NEGATIVE

## 2016-06-19 LAB — MICROALBUMIN / CREATININE URINE RATIO
CREATININE, URINE: 59 mg/dL (ref 20–320)
MICROALB UR: 0.4 mg/dL
MICROALB/CREAT RATIO: 7 ug/mg{creat} (ref <30)

## 2016-07-12 ENCOUNTER — Ambulatory Visit
Admission: RE | Admit: 2016-07-12 | Discharge: 2016-07-12 | Disposition: A | Discharge status: Home / Self Care | Payer: Medicare Other | Source: Ambulatory Visit | Attending: Internal Medicine | Admitting: Internal Medicine

## 2016-07-12 DIAGNOSIS — Z1231 Encounter for screening mammogram for malignant neoplasm of breast: Secondary | ICD-10-CM | POA: Diagnosis not present

## 2016-09-24 ENCOUNTER — Ambulatory Visit: Payer: Self-pay | Admitting: Internal Medicine

## 2016-10-11 ENCOUNTER — Other Ambulatory Visit: Payer: Self-pay | Admitting: Internal Medicine

## 2016-10-16 ENCOUNTER — Encounter (INDEPENDENT_AMBULATORY_CARE_PROVIDER_SITE_OTHER): Payer: Self-pay | Admitting: Orthopedic Surgery

## 2016-10-16 ENCOUNTER — Ambulatory Visit (INDEPENDENT_AMBULATORY_CARE_PROVIDER_SITE_OTHER): Payer: Medicare Other

## 2016-10-16 ENCOUNTER — Ambulatory Visit (INDEPENDENT_AMBULATORY_CARE_PROVIDER_SITE_OTHER): Payer: Medicare Other | Admitting: Orthopedic Surgery

## 2016-10-16 VITALS — BP 151/73 | HR 70 | Resp 14 | Ht 64.0 in | Wt 140.0 lb

## 2016-10-16 DIAGNOSIS — M25532 Pain in left wrist: Secondary | ICD-10-CM | POA: Diagnosis not present

## 2016-10-16 DIAGNOSIS — M19019 Primary osteoarthritis, unspecified shoulder: Secondary | ICD-10-CM | POA: Diagnosis not present

## 2016-10-16 DIAGNOSIS — M7541 Impingement syndrome of right shoulder: Secondary | ICD-10-CM | POA: Diagnosis not present

## 2016-10-16 DIAGNOSIS — M25511 Pain in right shoulder: Secondary | ICD-10-CM

## 2016-10-16 DIAGNOSIS — M1812 Unilateral primary osteoarthritis of first carpometacarpal joint, left hand: Secondary | ICD-10-CM

## 2016-10-16 DIAGNOSIS — G8929 Other chronic pain: Secondary | ICD-10-CM

## 2016-10-16 MED ORDER — BUPIVACAINE HCL 0.5 % IJ SOLN
2.0000 mL | INTRAMUSCULAR | Status: AC | PRN
  Administered 2016-10-16: 2 mL via INTRA_ARTICULAR

## 2016-10-16 MED ORDER — LIDOCAINE HCL 1 % IJ SOLN
2.0000 mL | INTRAMUSCULAR | Status: AC | PRN
  Administered 2016-10-16: 2 mL

## 2016-10-16 MED ORDER — BUPIVACAINE HCL 0.5 % IJ SOLN
1.0000 mL | INTRAMUSCULAR | Status: AC | PRN
  Administered 2016-10-16: 1 mL via INTRA_ARTICULAR

## 2016-10-16 MED ORDER — LIDOCAINE HCL 1 % IJ SOLN
1.0000 mL | INTRAMUSCULAR | Status: AC | PRN
  Administered 2016-10-16: 1 mL

## 2016-10-16 MED ORDER — METHYLPREDNISOLONE ACETATE 40 MG/ML IJ SUSP
80.0000 mg | INTRAMUSCULAR | Status: AC | PRN
  Administered 2016-10-16: 80 mg

## 2016-10-16 MED ORDER — METHYLPREDNISOLONE ACETATE 40 MG/ML IJ SUSP
40.0000 mg | INTRAMUSCULAR | Status: AC | PRN
  Administered 2016-10-16: 40 mg via INTRA_ARTICULAR

## 2016-10-16 NOTE — Progress Notes (Signed)
Office Visit Note   Patient: Kathryn Lamb           Date of Birth: 1950/11/03           MRN: [id number] Visit Date: 10/16/2016              Requested by: Kathryn Pinto, MD [address] [place], [place] [address] PCP: Kathryn Richards, MD   Assessment & Plan: Visit Diagnoses:  1. Primary osteoarthritis of first carpometacarpal joint of left hand   2. Pain in left wrist   3. Chronic right shoulder pain   4. Impingement syndrome of right shoulder   5. AC (acromioclavicular) arthritis     Plan:  #1: Corticosteroid injection to the first [facility] joint of the left hand with excellent results. #2: Given the wraparound splint for first [facility] OA #3: Medical steroid injection to subacromial space was also given with a very good results. Stenting of the musculature reveals near full strength. #4: Symptoms returned and her shoulder certainly she can give [place] a call and we'll order an MRI scan of the right shoulder to rule out a rotator cuff tear   Follow-Up Instructions: Return if symptoms worsen or fail to improve.   Orders:  Orders Placed This Encounter  Procedures  . XR Shoulder Right  . XR Wrist Complete Left   No orders of the defined types were placed in this encounter.     Procedures: Large Joint Inj Date/Time: 10/16/2016 4:56 PM Performed by: Kathryn Lamb Authorized by: Kathryn Lamb   Consent Given by:  Patient Timeout: prior to procedure the correct patient, procedure, and site was verified   Indications:  Pain Location:  Shoulder Site:  R subacromial bursa Prep: patient was prepped and draped in usual sterile fashion   Needle Size:  25 G Needle Length:  1.5 inches Approach:  Lateral Ultrasound Guidance: No   Fluoroscopic Guidance: No   Arthrogram: No   Medications:  80 mg methylPREDNISolone acetate 40 MG/ML; 2 mL lidocaine 1 %; 2 mL bupivacaine 0.5 % Aspiration Attempted: No   Patient tolerance:  Patient tolerated the procedure well  with no immediate complications Medium Joint Inj Date/Time: 10/16/2016 4:56 PM Performed by: Kathryn Lamb Authorized by: Kathryn Lamb   Consent Given by:  Patient Site marked: the procedure site was marked   Timeout: prior to procedure the correct patient, procedure, and site was verified   Indications:  Pain, joint swelling and diagnostic evaluation Location:  Wrist Site:  L radiocarpal Prep: patient was prepped and draped in usual sterile fashion   Needle Size:  25 G Needle Length:  1.5 inches Approach:  Anteromedial Ultrasound Guided: No   Fluoroscopic Guidance: No   Medications:  1 mL lidocaine 1 %; 1 mL bupivacaine 0.5 %; 40 mg methylPREDNISolone acetate 40 MG/ML Aspiration Attempted: No       Clinical Data: No additional findings.   Subjective: Chief Complaint  Patient presents with  . Right Shoulder - Pain  . Left Wrist - Pain    Kathryn Lamb is seen today for evaluation of her right shoulder and left wrist.  Right shoulder sad about a month's worth of pain without a history of injury or trauma. She's noted that she is also lost some motion because of this. She feels as if the shoulder is swollen and she has used some ibuprofen but only partially helpful.Her complaints to surrounding the shoulder as well as the proximal humeral area. She does  not have the pain below the elbow.   She states that she has had left wrist pain now for about 3 weeks with limited range of motion. She did fall in November 2017 on concrete. He much a fell on outstretched hand, had ecchymosis both volar and dorsal. She states she also has occasional tingling in the hand. She will drop things at times. She has difficulty with sleeping since the injury. She also has having some stiffness in the third and fourth.       Review of Systems  Constitutional: Negative.   HENT: Negative.   Respiratory: Negative.   Cardiovascular: Negative.   Gastrointestinal: Negative.   Genitourinary:  Negative.   Skin: Negative.   Neurological: Negative.   Hematological: Negative.   Psychiatric/Behavioral: Negative.      Objective: Vital Signs: BP (!) 151/73 (BP Location: Right Arm, Patient Position: Sitting, Cuff Size: Normal)   Pulse 70   Resp 14   Ht 5\' 4"  (1.626 m)   Wt 140 lb (63.5 kg)   BMI 24.03 kg/m   Physical Exam  Constitutional: She is oriented to person, place, and time. She appears well-developed and well-nourished.  HENT:  Head: Normocephalic and atraumatic.  Eyes: EOM are normal. Pupils are equal, round, and reactive to light.  Pulmonary/Chest: Effort normal.  Neurological: She is alert and oriented to person, place, and time.  Skin: Skin is warm and dry.  Psychiatric: She has a normal mood and affect. Her behavior is normal. Judgment and thought content normal.    Left Hand Exam   Tenderness  The patient is experiencing tenderness in the radial area.   Range of Motion   Wrist  Extension: 30  Flexion: 60  Pronation: 60  Supination: 60   Tests  Tinel's Sign (Medial Nerve): negative Finkelstein: negative  Other  Sensation: normal Pulse: present  Comments:  She does have prominence over the proximal first metacarpal area.. First CMC is subluxable but does cause her pain. Normal sensation testing     Right Shoulder Exam   Tenderness  The patient is experiencing tenderness in the acromioclavicular joint.  Range of Motion  Active Abduction: 80  Passive Abduction: 110  Forward Flexion: 130  External Rotation: 30  Internal Rotation 90 degrees: 40   Muscle Strength  Abduction: 3/5  Internal Rotation: 4/5  External Rotation: 3/5  Supraspinatus: 3/5  Subscapularis: 4/5  Biceps: 4/5   Tests  Impingement: positive  Other  Sensation: normal Pulse: present  Comments:  Post cortisone injection did have 160 of abduction and forward flexion. With the arm at 90 of abduction and had around 75 of external rotation and 75 of internal  rotation. Pain much improved.  Cervical spine does reveal mentation motion about 30 of right and left rotation. However this does not cause her any symptoms into her arms. She does not have any symptoms either with backward extension or forward flexion. She has good grip strength bilaterally.      Specialty Comments:  No specialty comments available.  Imaging: Xr Wrist Complete Left  Result Date: 10/16/2016 Three-view x-ray of the left wrist reveals OA and spurring at the first [facility] joint with cystic changes does have some small cysts in the radial styloid also on the AP.  Xr Shoulder Right  Result Date: 10/16/2016 Two-view right shoulder reveals irregularity of the acromion at the before meals joint appears possibly there has been subacromial abnormality. Certainly  some cystic changes  sclerosis. Cannot tell if there is  been some type of a fracture involving the acromion at the acromioclavicular joint    PMFS History: Patient Active Problem List   Diagnosis Date Noted  . Encounter for Medicare annual wellness exam 03/15/2016  . Elevated BP 11/16/2015  . Vitamin Lamb deficiency 11/16/2015  . Medication management 11/16/2015  . Lower back pain 01/10/2015  . Prediabetes 08/22/2014  . Mixed hyperlipidemia 10/05/2013  . Rosacea   . Anxiety   . 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 Ischemic colitis [facility])    Past Medical History:  Diagnosis Date  . Anxiety   . Depression   . Diverticulitis   . Ischemic colitis ([place])   . Mixed hyperlipidemia 10/05/2013  . Rosacea     Family History  Problem Relation Age of Onset  . Diabetes Father   . Alcohol abuse Father   . Cancer Father     prostate, lung  . Hypertension Mother   . Stroke Mother   . Heart disease Mother   . Diabetes Sister     Past Surgical History:  Procedure Laterality Date  . ABDOMINAL HYSTERECTOMY    . APPENDECTOMY    . CARPAL TUNNEL RELEASE Right 2004  . LUMBAR LAMINECTOMY  2000  . MICRODISCECTOMY  LUMBAR    . TUBAL LIGATION     Social History   Occupational History  . Not on file.   Social History Main Topics  . Smoking status: Former Smoker    Packs/day: 1.00    Types: Cigarettes    Quit date: 10/04/2008  . Smokeless tobacco: Never Used  . Alcohol use No  . Drug use: No  . Sexual activity: Not on file

## 2016-12-02 ENCOUNTER — Other Ambulatory Visit: Payer: Self-pay | Admitting: Internal Medicine

## 2017-01-20 ENCOUNTER — Ambulatory Visit: Payer: Self-pay | Admitting: Internal Medicine

## 2017-02-06 ENCOUNTER — Ambulatory Visit (INDEPENDENT_AMBULATORY_CARE_PROVIDER_SITE_OTHER): Payer: Medicare Other | Admitting: Orthopedic Surgery

## 2017-02-06 ENCOUNTER — Encounter (INDEPENDENT_AMBULATORY_CARE_PROVIDER_SITE_OTHER): Payer: Self-pay | Admitting: Orthopedic Surgery

## 2017-02-06 VITALS — BP 129/76 | HR 69 | Resp 14 | Ht 64.0 in | Wt 145.0 lb

## 2017-02-06 DIAGNOSIS — G8929 Other chronic pain: Secondary | ICD-10-CM

## 2017-02-06 DIAGNOSIS — M7541 Impingement syndrome of right shoulder: Secondary | ICD-10-CM | POA: Diagnosis not present

## 2017-02-06 DIAGNOSIS — M7551 Bursitis of right shoulder: Secondary | ICD-10-CM | POA: Diagnosis not present

## 2017-02-06 DIAGNOSIS — M25511 Pain in right shoulder: Secondary | ICD-10-CM

## 2017-02-06 MED ORDER — BUPIVACAINE HCL 0.5 % IJ SOLN
2.0000 mL | INTRAMUSCULAR | Status: AC | PRN
  Administered 2017-02-06: 2 mL via INTRA_ARTICULAR

## 2017-02-06 MED ORDER — METHYLPREDNISOLONE ACETATE 40 MG/ML IJ SUSP
80.0000 mg | INTRAMUSCULAR | Status: AC | PRN
  Administered 2017-02-06: 80 mg

## 2017-02-06 MED ORDER — LIDOCAINE HCL 2 % IJ SOLN
2.0000 mL | INTRAMUSCULAR | Status: AC | PRN
  Administered 2017-02-06: 2 mL

## 2017-02-06 NOTE — Progress Notes (Signed)
Office Visit Note   Patient: Kathryn Lamb           Date of Birth: 1951-02-18           MRN: [id number] Visit Date: 02/06/2017              Requested by: Unk Pinto, [place],  [address] PCP: Unk Pinto, MD   Assessment & Plan: Visit Diagnoses:  1. Impingement syndrome of right shoulder   2. Chronic right shoulder pain   3. Subacromial bursitis of right shoulder joint     Plan:  #1: Corticosteroid injection was given to the right shoulder without difficulty and was very good results. #2: Because of his chronicity and increasing pain I am going to also order a MRI scan of the right shoulder to rule out a rotator cuff tear. She does not have time for her surgery until September so I like to get this now so that we have the results of this for surgical intervention then. #3: She'll give [place] a call after MRI scan and we can discuss the results with her.  Follow-Up Instructions: No Follow-up on file.   Orders:  No orders of the defined types were placed in this encounter.  No orders of the defined types were placed in this encounter.     Procedures: Large Joint Inj Date/Time: 02/06/2017 12:39 PM Performed by: Biagio Borg D Authorized by: Biagio Borg D   Consent Given by:  Patient Timeout: prior to procedure the correct patient, procedure, and site was verified   Indications:  Pain Location:  Shoulder Site:  L subacromial bursa Prep: patient was prepped and draped in usual sterile fashion   Needle Size:  25 G Needle Length:  1.5 inches Approach:  Lateral Ultrasound Guidance: No   Fluoroscopic Guidance: No   Arthrogram: No   Medications:  80 mg methylPREDNISolone acetate 40 MG/ML; 2 mL bupivacaine 0.5 %; 2 mL lidocaine 2 % Aspiration Attempted: No   Patient tolerance:  Patient tolerated the procedure well with no immediate complications     Clinical Data: No additional findings.   Subjective: Chief  Complaint  Patient presents with  . Right Shoulder - Pain, Numbness    Kathryn Lamb is a 66 y o with chronic R shoulder pain . She received a cortisone injection in March 7 and would like one more so she can have surgery in September. She relates her L CMC join injection worked very well with no pain.    Kathryn Lamb is a 43 y o white female with chronic right shoulder pain . In February of this year she had developed right shoulder pain  without a history of injury or trauma. She's noted that she had also lost some motion because of this the pain.. She felt as if the shoulder  swollen and she had used some ibuprofen but only partially helpful.Her complaints to surrounding the shoulder as well as the proximal humeral area. She received a cortisone injection in March 7 and did well for several months. She would like to consider having surgery in September and has requested another injection. Apparently with vacation schedules her husband is going to have surgery now and the next vacation will be in September. Today she is extremely symptomatic and cannot move the arm much all without having pain. She relates her L CMC join injection worked very well with no pain.     Review of Systems  All other systems  reviewed and are negative.    Objective: Vital Signs: BP 129/76   Pulse 69   Resp 14   Ht 5\' 4"  (1.626 m)   Wt 145 lb (65.8 kg)   BMI 24.89 kg/m   Physical Exam  Constitutional: She is oriented to person, place, and time. She appears well-developed and well-nourished.  HENT:  Head: Normocephalic and atraumatic.  Eyes: EOM are normal. Pupils are equal, round, and reactive to light.  Pulmonary/Chest: Effort normal.  Neurological: She is alert and oriented to person, place, and time.  Skin: Skin is warm and dry.  Psychiatric: She has a normal mood and affect. Her behavior is normal. Judgment and thought content normal.    Ortho Exam Today she has very limited range of motion of the  shoulder. I was able to get her to about 45 of forward flexion and about 45 of abduction when she was starting to stop me from going further. Internal and external rotation also caused her significant pain and discomfort. Numbness in the abduction and forward flexion. We can external rotation with the arm at her side. Internal rotation is very strong. Little bit of before meals pain noted.  Specialty Comments:  No specialty comments available.  Imaging: No results found.   PMFS History: Patient Active Problem List   Diagnosis Date Noted  . Encounter for Medicare annual wellness exam 03/15/2016  . Elevated BP 11/16/2015  . Vitamin D deficiency 11/16/2015  . Medication management 11/16/2015  . Lower back pain 01/10/2015  . Prediabetes 08/22/2014  . Mixed hyperlipidemia 10/05/2013  . Rosacea   . Anxiety   . 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 Ischemic colitis [facility])    Past Medical History:  Diagnosis Date  . Anxiety   . Depression   . Diverticulitis   . Ischemic colitis ([place])   . Mixed hyperlipidemia 10/05/2013  . Rosacea     Family History  Problem Relation Age of Onset  . Diabetes Father   . Alcohol abuse Father   . Cancer Father        prostate, lung  . Hypertension Mother   . Stroke Mother   . Heart disease Mother   . Diabetes Sister     Past Surgical History:  Procedure Laterality Date  . ABDOMINAL HYSTERECTOMY    . APPENDECTOMY    . CARPAL TUNNEL RELEASE Right 2004  . LUMBAR LAMINECTOMY  2000  . MICRODISCECTOMY LUMBAR    . TUBAL LIGATION     Social History   Occupational History  . Not on file.   Social History Main Topics  . Smoking status: Former Smoker    Packs/day: 1.00    Types: Cigarettes    Quit date: 10/04/2008  . Smokeless tobacco: Never Used  . Alcohol use No  . Drug use: No  . Sexual activity: Not on file

## 2017-02-17 ENCOUNTER — Encounter: Payer: Self-pay | Admitting: Internal Medicine

## 2017-02-17 NOTE — Patient Instructions (Signed)

## 2017-02-17 NOTE — Progress Notes (Signed)
This very nice 66 y.o. MWF presents for 3 month follow up with Hypertension, Hyperlipidemia, Pre-Diabetes and Vitamin D Deficiency.      Patient is monitored expectantly for labile HTN & BP has been controlled at home. Today's BP is at goal -126/80. Patient has had no complaints of any cardiac type chest pain, palpitations, dyspnea/orthopnea/PND, dizziness, claudication, or dependent edema.     Hyperlipidemia is not controlled with diet & meds. Patient denies myalgias or other med SE's. Last Lipids were not at goal: Lab Results  Component Value Date   CHOL 226 (H) 06/18/2016   HDL 53 06/18/2016   LDLCALC 130 (H) 06/18/2016   TRIG 217 (H) 06/18/2016   CHOLHDL 4.3 06/18/2016      Also, the patient has history of PreDiabetes (A1c 5.7% in Sept 2016)  and has had no symptoms of reactive hypoglycemia, diabetic polys, paresthesias or visual blurring.  Last A1c was at goal: Lab Results  Component Value Date   HGBA1C 5.5 03/15/2016      Further, the patient also has history of Vitamin D Deficiency and supplements vitamin D without any suspected side-effects. Last vitamin D was at goal:  Lab Results  Component Value Date   VD25OH 84 11/16/2015   Current Outpatient Prescriptions on File Prior to Visit  Medication Sig  . atorvastatin (LIPITOR) 80 MG tablet TAKE 1 TABLET BY MOUTH  DAILY  . buPROPion (WELLBUTRIN XL) 300 MG 24 hr tablet TAKE 1 TABLET BY MOUTH  DAILY  . cetirizine (ZYRTEC) 5 MG tablet Take 5 mg by mouth daily.  . Cholecalciferol (VITAMIN D) 2000 UNITS tablet Take 2,000 Units by mouth daily.  . Flaxseed, Linseed, (FLAX SEED OIL) 1000 MG CAPS Take 2,600 mg by mouth daily.   Marland Kitchen gabapentin (NEURONTIN) 100 MG capsule TAKE 2 CAPSULES BY MOUTH 3  TIMES DAILY  . Multiple Vitamin (MULTIVITAMIN WITH MINERALS) TABS tablet Take 1 tablet by mouth. Takes 3 times per week. M,W,F  . Omega-3 Fatty Acids (FISH OIL) 1000 MG CAPS Take 3,000 mg by mouth daily.  . Probiotic Product (PROBIOTIC DAILY  PO) Take by mouth daily.   No current facility-administered medications on file prior to visit.    Allergies  Allergen Reactions  . Doxycycline   . Erythromycin   . Flagyl [Metronidazole]   . Penicillins   . Sulfa Antibiotics    PMHx:   Past Medical History:  Diagnosis Date  . Anxiety   . Depression   . Diverticulitis   . Ischemic colitis ([place])   . Mixed hyperlipidemia 10/05/2013  . Rosacea    Immunization History  Administered Date(s) Administered  . Influenza Split 05/17/2013, 05/17/2014  . Influenza, High Dose Seasonal PF 05/09/2015, 06/18/2016  . PPD Test 05/04/2014  . Pneumococcal Conjugate-13 05/09/2015  . Pneumococcal Polysaccharide-23 10/04/2013  . Td 08/30/2002  . Tdap 04/07/2012  . Varicella Zoster Immune Globulin 03/15/2016   Past Surgical History:  Procedure Laterality Date  . ABDOMINAL HYSTERECTOMY    . APPENDECTOMY    . CARPAL TUNNEL RELEASE Right 2004  . LUMBAR LAMINECTOMY  2000  . MICRODISCECTOMY LUMBAR    . TUBAL LIGATION     FHx:    Reviewed / unchanged  SHx:    Reviewed / unchanged  Systems Review:  Constitutional: Denies fever, chills, wt changes, headaches, insomnia, fatigue, night sweats, change in appetite. Eyes: Denies redness, blurred vision, diplopia, discharge, itchy, watery eyes.  ENT: Denies discharge, congestion, post nasal drip, epistaxis, sore throat,  earache, hearing loss, dental pain, tinnitus, vertigo, sinus pain, snoring.  CV: Denies chest pain, palpitations, irregular heartbeat, syncope, dyspnea, diaphoresis, orthopnea, PND, claudication or edema. Respiratory: denies cough, dyspnea, DOE, pleurisy, hoarseness, laryngitis, wheezing.  Gastrointestinal: Denies dysphagia, odynophagia, heartburn, reflux, water brash, abdominal pain or cramps, nausea, vomiting, bloating, diarrhea, constipation, hematemesis, melena, hematochezia  or hemorrhoids. Genitourinary: Denies dysuria, frequency, urgency, nocturia, hesitancy, discharge, hematuria  or flank pain. Musculoskeletal: Denies arthralgias, myalgias, stiffness, jt. swelling, pain, limping or strain/sprain.  Skin: Denies pruritus, rash, hives, warts, acne, eczema or change in skin lesion(s). Neuro: No weakness, tremor, incoordination, spasms, paresthesia or pain. Psychiatric: Denies confusion, memory loss or sensory loss. Endo: Denies change in weight, skin or hair change.  Heme/Lymph: No excessive bleeding, bruising or enlarged lymph nodes.  Physical Exam  BP 126/80   Pulse 72   Temp (!) 97.5 F (36.4 C)   Resp 16   Ht 5\' 4"  (1.626 m)   Wt 145 lb (65.8 kg)   BMI 24.89 kg/m   Appears well nourished, well groomed  and in no distress.  Eyes: PERRLA, EOMs, conjunctiva no swelling or erythema. Sinuses: No frontal/maxillary tenderness ENT/Mouth: EAC's clear, TM's nl w/o erythema, bulging. Nares clear w/o erythema, swelling, exudates. Oropharynx clear without erythema or exudates. Oral hygiene is good. Tongue normal, non obstructing. Hearing intact.  Neck: Supple. Thyroid nl. Car 2+/2+ without bruits, nodes or JVD. Chest: Respirations nl with BS clear & equal w/o rales, rhonchi, wheezing or stridor.  Cor: Heart sounds normal w/ regular rate and rhythm without sig. murmurs, gallops, clicks or rubs. Peripheral pulses normal and equal  without edema.  Abdomen: Soft & bowel sounds normal. Non-tender w/o guarding, rebound, hernias, masses or organomegaly.  Lymphatics: Unremarkable.  Musculoskeletal: Full ROM all peripheral extremities, joint stability, 5/5 strength and normal gait.  Skin: Warm, dry without exposed rashes, lesions or ecchymosis apparent.  Neuro: Cranial nerves intact, reflexes equal bilaterally. Sensory-motor testing grossly intact. Tendon reflexes grossly intact.  Pysch: Alert & oriented x 3.  Insight and judgement nl & appropriate. No ideations.  Assessment and Plan:  1. Elevated blood pressure reading without diagnosis of hypertension  - Continue  medication, monitor blood pressure at home.  - Continue DASH diet. Reminder to go to the ER if any CP,  SOB, nausea, dizziness, severe HA, changes vision/speech.  - CBC with Differential/Platelet - BASIC METABOLIC PANEL WITH GFR - Magnesium - TSH  2. Hyperlipidemia, mixed  - Continue diet/meds, exercise,& lifestyle modifications.  - Continue monitor periodic cholesterol/liver & renal functions   - Hepatic function panel - Lipid panel - TSH  3. Prediabetes  - Continue diet, exercise, lifestyle modifications.  - Monitor appropriate labs.  - Insulin, random  4. Vitamin D deficiency  - Continue supplementation.  - VITAMIN D 25 Hydroxy   5. Medication management  - CBC with Differential/Platelet - BASIC METABOLIC PANEL WITH GFR - Hepatic function panel - Magnesium - Lipid panel - TSH - Hemoglobin A1c - Insulin, random - VITAMIN D 25 Hydroxy       Discussed  regular exercise, BP monitoring, weight control to achieve/maintain BMI less than 25 and discussed med and SE's. Recommended labs to assess and monitor clinical status with further disposition pending results of labs. Over 30 minutes of exam, counseling, chart review was performed.

## 2017-02-18 ENCOUNTER — Encounter: Payer: Self-pay | Admitting: Internal Medicine

## 2017-02-18 ENCOUNTER — Ambulatory Visit (INDEPENDENT_AMBULATORY_CARE_PROVIDER_SITE_OTHER): Payer: Medicare Other | Admitting: Internal Medicine

## 2017-02-18 VITALS — BP 126/80 | HR 72 | Temp 97.5°F | Resp 16 | Ht 64.0 in | Wt 145.0 lb

## 2017-02-18 DIAGNOSIS — E782 Mixed hyperlipidemia: Secondary | ICD-10-CM | POA: Diagnosis not present

## 2017-02-18 DIAGNOSIS — Z79899 Other long term (current) drug therapy: Secondary | ICD-10-CM | POA: Diagnosis not present

## 2017-02-18 DIAGNOSIS — E559 Vitamin D deficiency, unspecified: Secondary | ICD-10-CM | POA: Diagnosis not present

## 2017-02-18 DIAGNOSIS — R030 Elevated blood-pressure reading, without diagnosis of hypertension: Secondary | ICD-10-CM | POA: Diagnosis not present

## 2017-02-18 DIAGNOSIS — R7303 Prediabetes: Secondary | ICD-10-CM | POA: Diagnosis not present

## 2017-02-18 LAB — CBC WITH DIFFERENTIAL/PLATELET
BASOS ABS: 0 {cells}/uL (ref 0–200)
Basophils Relative: 0 %
EOS PCT: 4 %
Eosinophils Absolute: 216 cells/uL (ref 15–500)
HCT: 39.9 % (ref 35.0–45.0)
HEMOGLOBIN: 13.3 g/dL (ref 11.7–15.5)
LYMPHS ABS: 2592 {cells}/uL (ref 850–3900)
Lymphocytes Relative: 48 %
MCH: 30.2 pg (ref 27.0–33.0)
MCHC: 33.3 g/dL (ref 32.0–36.0)
MCV: 90.7 fL (ref 80.0–100.0)
MPV: 9.2 fL (ref 7.5–12.5)
Monocytes Absolute: 270 cells/uL (ref 200–950)
Monocytes Relative: 5 %
NEUTROS ABS: 2322 {cells}/uL (ref 1500–7800)
Neutrophils Relative %: 43 %
Platelets: 277 10*3/uL (ref 140–400)
RBC: 4.4 MIL/uL (ref 3.80–5.10)
RDW: 13.6 % (ref 11.0–15.0)
WBC: 5.4 10*3/uL (ref 3.8–10.8)

## 2017-02-19 ENCOUNTER — Other Ambulatory Visit: Payer: Self-pay | Admitting: Internal Medicine

## 2017-02-19 DIAGNOSIS — E782 Mixed hyperlipidemia: Secondary | ICD-10-CM

## 2017-02-19 LAB — LIPID PANEL
CHOL/HDL RATIO: 4.1 ratio (ref <5.0)
CHOLESTEROL: 256 mg/dL — AB (ref <200)
HDL: 62 mg/dL (ref >50)
LDL Cholesterol: 147 mg/dL — ABNORMAL HIGH (ref <100)
Triglycerides: 234 mg/dL — ABNORMAL HIGH (ref <150)
VLDL: 47 mg/dL — ABNORMAL HIGH (ref <30)

## 2017-02-19 LAB — HEPATIC FUNCTION PANEL
ALBUMIN: 4.8 g/dL (ref 3.6–5.1)
ALT: 20 U/L (ref 6–29)
AST: 18 U/L (ref 10–35)
Alkaline Phosphatase: 56 U/L (ref 33–130)
BILIRUBIN TOTAL: 0.7 mg/dL (ref 0.2–1.2)
Bilirubin, Direct: 0.1 mg/dL (ref <=0.2)
Indirect Bilirubin: 0.6 mg/dL (ref 0.2–1.2)
Total Protein: 7.4 g/dL (ref 6.1–8.1)

## 2017-02-19 LAB — BASIC METABOLIC PANEL WITH GFR
BUN: 17 mg/dL (ref 7–25)
CHLORIDE: 102 mmol/L (ref 98–110)
CO2: 23 mmol/L (ref 20–31)
Calcium: 9.8 mg/dL (ref 8.6–10.4)
Creat: 0.84 mg/dL (ref 0.50–0.99)
GFR, EST NON AFRICAN AMERICAN: 72 mL/min (ref >=60)
GFR, Est African American: 83 mL/min (ref >=60)
Glucose, Bld: 94 mg/dL (ref 65–99)
POTASSIUM: 4.6 mmol/L (ref 3.5–5.3)
SODIUM: 139 mmol/L (ref 135–146)

## 2017-02-19 LAB — MAGNESIUM: MAGNESIUM: 2.1 mg/dL (ref 1.5–2.5)

## 2017-02-19 LAB — HEMOGLOBIN A1C
Hgb A1c MFr Bld: 5.7 % — ABNORMAL HIGH (ref <5.7)
Mean Plasma Glucose: 117 mg/dL

## 2017-02-19 LAB — VITAMIN D 25 HYDROXY (VIT D DEFICIENCY, FRACTURES): VIT D 25 HYDROXY: 67 ng/mL (ref 30–100)

## 2017-02-19 LAB — INSULIN, RANDOM: INSULIN: 12.7 u[IU]/mL (ref 2.0–19.6)

## 2017-02-19 LAB — TSH: TSH: 4.53 mIU/L — ABNORMAL HIGH

## 2017-02-19 MED ORDER — EZETIMIBE 10 MG PO TABS: ORAL_TABLET | ORAL | 1 refills | Status: DC

## 2017-02-24 ENCOUNTER — Other Ambulatory Visit: Payer: Self-pay | Admitting: *Deleted

## 2017-02-24 DIAGNOSIS — G8929 Other chronic pain: Secondary | ICD-10-CM

## 2017-02-24 DIAGNOSIS — M25511 Pain in right shoulder: Principal | ICD-10-CM

## 2017-03-03 ENCOUNTER — Other Ambulatory Visit: Payer: Self-pay | Admitting: Orthopedic Surgery

## 2017-03-07 ENCOUNTER — Ambulatory Visit
Admission: RE | Admit: 2017-03-07 | Discharge: 2017-03-07 | Disposition: A | Discharge status: Home / Self Care | Payer: Medicare Other | Source: Ambulatory Visit | Attending: Orthopedic Surgery | Admitting: Orthopedic Surgery

## 2017-03-07 DIAGNOSIS — G8929 Other chronic pain: Secondary | ICD-10-CM

## 2017-03-07 DIAGNOSIS — S46011A Strain of muscle(s) and tendon(s) of the rotator cuff of right shoulder, initial encounter: Secondary | ICD-10-CM | POA: Diagnosis not present

## 2017-03-07 DIAGNOSIS — M25511 Pain in right shoulder: Principal | ICD-10-CM

## 2017-03-13 ENCOUNTER — Encounter (INDEPENDENT_AMBULATORY_CARE_PROVIDER_SITE_OTHER): Payer: Self-pay | Admitting: Orthopaedic Surgery

## 2017-03-13 ENCOUNTER — Ambulatory Visit (INDEPENDENT_AMBULATORY_CARE_PROVIDER_SITE_OTHER): Payer: Medicare Other | Admitting: Orthopaedic Surgery

## 2017-03-13 VITALS — Ht 64.0 in | Wt 145.0 lb

## 2017-03-13 DIAGNOSIS — M25511 Pain in right shoulder: Secondary | ICD-10-CM | POA: Diagnosis not present

## 2017-03-13 DIAGNOSIS — G8929 Other chronic pain: Secondary | ICD-10-CM

## 2017-03-13 MED ORDER — DICLOFENAC SODIUM 1 % TD GEL: 2.0000 g | Freq: Four times a day (QID) | TRANSDERMAL | 1 refills | Status: DC

## 2017-03-13 NOTE — Progress Notes (Signed)
Office Visit Note   Patient: Kathryn Lamb           Date of Birth: 04/04/1951           MRN: [id number] Visit Date: 03/13/2017              Requested by: Unk Pinto, [address] [place], [place] [address] PCP: Unk Pinto, MD   Assessment & Plan: Visit Diagnoses:  1. Chronic right shoulder pain   MRI scan demonstrates diffuse rotator cuff tendinosis affecting the subscapularis, supraspinatus and infraspinatus tendons there are small interstitial tears A greatest in the subscapularis tendon but no evidence of a full-thickness rotator cuff tear. No muscle atrophy. Long head biceps intact. Type I acromion. Unfused os acromion allergy. Moderate degenerative changes of the acromioclavicular joint. A moderate amount of fluid in the subacromial subdeltoid bursa and mild glenohumeral degenerative changes  Plan: Long discussion regarding MRI scan findings. She is better since she had the subacromial cortisone injection. I have suggested appropriate doses of NSAIDs and physical therapy for instructions on shoulder strengthening exercises. We've even discussed future treatment including arthroscopic debridement  Follow-Up Instructions: Return if symptoms worsen or fail to improve.   Orders:  No orders of the defined types were placed in this encounter.  Meds ordered this encounter  Medications  . diclofenac sodium (VOLTAREN) 1 % GEL    Sig: Apply 2 g topically 4 (four) times daily.    Dispense:  3 Tube    Refill:  1      Procedures: No procedures performed   Clinical Data: No additional findings.   Subjective: Chief Complaint  Patient presents with  . Right Shoulder - Results    Kathryn Lamb is here for MRI results from her R shoulder. On 6/28 she received a cortisone injection and her R shoulder still feels better.  Kathryn Lamb's been having trouble with her shoulders and she had a fall about 7 months ago. She actually has had some response to a  subacromial cortisone injection of the right shoulder but still having some discomfort accordingly ordered an MRI scan. The results are as identified above. She's not having any trouble sleeping but does have some difficulty raising her arm over her head and reaching above eye level. He denies numbness tingling.  HPI  Review of Systems   Objective: Vital Signs: Ht 5\' 4"  (1.626 m)   Wt 145 lb (65.8 kg)   BMI 24.89 kg/m   Physical Exam minimal impingement with internal/external rotation of the right shoulder in the impingement position. Minimally positive empty can testing. Able to raise her arm quickly and fully overhead. No weakness with internal/external rotation. Biceps intact. Some pain over the anterior aspect of the shoulder but not at the acromioclavicular joint. No popping clicking or grinding. No pain with range of motion of the cervical spine. Neurovascular exam intact.  Ortho Exam  Specialty Comments:  No specialty comments available.  Imaging: No results found.   PMFS History: Patient Active Problem List   Diagnosis Date Noted  . Encounter for Medicare annual wellness exam 03/15/2016  . Elevated BP 11/16/2015  . Vitamin D deficiency 11/16/2015  . Medication management 11/16/2015  . Lower back pain 01/10/2015  . Prediabetes 08/22/2014  . Mixed hyperlipidemia 10/05/2013  . Rosacea   . Anxiety   . 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 Ischemic colitis [facility])    Past Medical History:  Diagnosis Date  . Anxiety   .  Depression   . Diverticulitis   . Ischemic colitis ([place])   . Mixed hyperlipidemia 10/05/2013  . Rosacea     Family History  Problem Relation Age of Onset  . Diabetes Father   . Alcohol abuse Father   . Cancer Father        prostate, lung  . Hypertension Mother   . Stroke Mother   . Heart disease Mother   . Diabetes Sister     Past Surgical History:  Procedure Laterality Date  . ABDOMINAL HYSTERECTOMY    . APPENDECTOMY    . CARPAL  TUNNEL RELEASE Right 2004  . LUMBAR LAMINECTOMY  2000  . MICRODISCECTOMY LUMBAR    . TUBAL LIGATION     Social History   Occupational History  . Not on file.   Social History Main Topics  . Smoking status: Former Smoker    Packs/day: 1.00    Types: Cigarettes    Quit date: 10/04/2008  . Smokeless tobacco: Never Used  . Alcohol use No  . Drug use: No  . Sexual activity: Not on file     Garald Balding, MD   Note - This record has been created using [employer].  Chart creation errors have been sought, but may not always  have been located. Such creation errors do not reflect on  the standard of medical care.

## 2017-03-19 ENCOUNTER — Telehealth (INDEPENDENT_AMBULATORY_CARE_PROVIDER_SITE_OTHER): Payer: Self-pay | Admitting: Orthopaedic Surgery

## 2017-03-19 NOTE — Telephone Encounter (Signed)
Patient calling to check on status of PT referral. Please call to advise.

## 2017-03-20 ENCOUNTER — Other Ambulatory Visit (INDEPENDENT_AMBULATORY_CARE_PROVIDER_SITE_OTHER): Payer: Self-pay

## 2017-03-20 DIAGNOSIS — M25511 Pain in right shoulder: Principal | ICD-10-CM

## 2017-03-20 DIAGNOSIS — G8929 Other chronic pain: Secondary | ICD-10-CM

## 2017-03-20 NOTE — Telephone Encounter (Signed)
Sent referral to [facility] rehab and called pt

## 2017-04-01 ENCOUNTER — Ambulatory Visit: Payer: Medicare Other | Attending: Internal Medicine | Admitting: Physical Therapy

## 2017-04-01 ENCOUNTER — Encounter: Payer: Self-pay | Admitting: Physical Therapy

## 2017-04-01 DIAGNOSIS — M25611 Stiffness of right shoulder, not elsewhere classified: Secondary | ICD-10-CM | POA: Insufficient documentation

## 2017-04-01 DIAGNOSIS — M6281 Muscle weakness (generalized): Secondary | ICD-10-CM | POA: Diagnosis not present

## 2017-04-01 DIAGNOSIS — G8929 Other chronic pain: Secondary | ICD-10-CM | POA: Insufficient documentation

## 2017-04-01 DIAGNOSIS — M25511 Pain in right shoulder: Secondary | ICD-10-CM | POA: Insufficient documentation

## 2017-04-01 NOTE — Therapy (Signed)
[place], [place], [address] Phone: [phone]   Fax:  [phone]  Physical Therapy Evaluation  Patient Details  Name: IDABELLE MCPETERS MRN: [id number] Date of Birth: Aug 10, 1951 Referring Provider: Dr Joni Fears   Encounter Date: 04/01/2017      PT End of Session - 04/01/17 1653    Visit Number 1   Number of Visits 16   Date for PT Re-Evaluation 05/27/17   Authorization Type [facility] MCR    PT Start Time 6962   PT Stop Time 1233   PT Time Calculation (min) 44 min   Activity Tolerance Patient tolerated treatment well   Behavior During Therapy [facility] for tasks assessed/performed      Past Medical History:  Diagnosis Date  . Anxiety   . Depression   . Diverticulitis   . Ischemic colitis ([place])   . Mixed hyperlipidemia 10/05/2013  . Rosacea     Past Surgical History:  Procedure Laterality Date  . ABDOMINAL HYSTERECTOMY    . APPENDECTOMY    . CARPAL TUNNEL RELEASE Right 2004  . LUMBAR LAMINECTOMY  2000  . MICRODISCECTOMY LUMBAR    . TUBAL LIGATION      There were no vitals filed for this visit.       Subjective Assessment - 04/01/17 1153    Subjective Patient began having shoulder pain last novemebr after a fall. MRI revealed multi tendon tendinosis in the right shoulder as well as moderate AC joint degneration.  She has pain when she is perfroming daily tasks such as cleaning. Prior to her injection she was also having pain when reaching.      Limitations House hold activities;Lifting   Diagnostic tests R shoulder MRI: difuse right rotator cuff tendinosis with small interstitial tears. No full thickness tears; Moderate AC joint degeneration    Patient Stated Goals to have less pain   Currently in Pain? Yes   Pain Score 2    Pain Location Shoulder   Pain Orientation Right   Pain Descriptors / Indicators Aching   Pain Onset More than a month ago   Pain Frequency Intermittent   Aggravating  Factors  household activity; prior to the shout raising the arm up    Pain Relieving Factors rest, ice; ibuprofin    Effect of Pain on Daily Activities difficulty perfroming hosehold tasks             [facility] PT Assessment - 04/01/17 0001      Assessment   Medical Diagnosis Right RTC tendinitis    Referring Provider Dr Joni Fears    Onset Date/Surgical Date --  Burman Riis 2017    Hand Dominance Right   Next MD Visit None scheduled    Prior Therapy None      Precautions   Precautions None     Restrictions   Weight Bearing Restrictions No   Other Position/Activity Restrictions n     Balance Screen   Has the patient fallen in the past 6 months No   Has the patient had a decrease in activity level because of a fear of falling?  No   Is the patient reluctant to leave their home because of a fear of falling?  No     Home Environment   Additional Comments nothing pertinant      Prior Function   Level of Independence Independent   Vocation Retired   Clinical research associate, weed eating      Cognition  Overall Cognitive Status Within Functional Limits for tasks assessed   Attention Focused   Focused Attention Appears intact   Memory Appears intact   Awareness Appears intact   Problem Solving Appears intact     Sensation   Light Touch Appears Intact   Additional Comments denies parathesias      Coordination   Gross Motor Movements are Fluid and Coordinated Yes   Fine Motor Movements are Fluid and Coordinated Yes     Posture/Postural Control   Posture Comments Forward head      ROM / Strength   AROM / PROM / Strength AROM;PROM;Strength     AROM   AROM Assessment Site Shoulder   Right/Left Shoulder Right   Right Shoulder Flexion 140 Degrees  with ERP    Right Shoulder Internal Rotation --  tightness when reaching to L3/L4    Right Shoulder External Rotation --  able to reach behind her head but with minor pain      PROM   PROM Assessment Site Shoulder    Right/Left Shoulder Right   Right Shoulder Flexion 160 Degrees  with pain      Strength   Strength Assessment Site Shoulder   Right/Left Shoulder Right   Right Shoulder Flexion 4/5   Right Shoulder Internal Rotation 5/5   Right Shoulder External Rotation 4+/5     Palpation   Palpation comment pain in the right bicpes groove      Special Tests    Special Tests Rotator Cuff Impingement;Laxity/Instability Tests;Biceps/Labral Tests  RTC not tested 2nd to MRI    Rotator Cuff Impingment tests Michel Bickers test   Biceps/Labral tests Speeds Test     Hawkins-Kennedy test   Comments (+) right      Speeds test   Comment (+)             Objective measurements completed on examination: See above findings.          [facility] Adult PT Treatment/Exercise - 04/01/17 0001      Shoulder Exercises: Supine   Other Supine Exercises wand flexion 2x10      Shoulder Exercises: Sidelying   Other Sidelying Exercises ER 2x10      Shoulder Exercises: Standing   Other Standing Exercises shoulder extension 2x10; scpaular retraction 2x10;      Manual Therapy   Manual therapy comments Grade II and III PA and inferior glides to reduce impingement.                 PT Education - 04/01/17 1156    Education provided Yes   Education Details reviewed HEP; symptom mangement; anatomy of her condition    Person(s) Educated Patient;Spouse   Methods Explanation;Demonstration;Tactile cues   Comprehension Verbalized understanding;Returned demonstration;Verbal cues required;Tactile cues required          PT Short Term Goals - 04/01/17 1704      PT SHORT TERM GOAL #1   Title Patient will demsotrate full pain free ight shoulder flexion    Time 4   Period Weeks   Status New     PT SHORT TERM GOAL #2   Title Patient will demsotrate 5/5 gross right shoulder strength    Time 4   Period Weeks   Status New     PT SHORT TERM GOAL #3   Title Patient will be independent with basic HEP     Time 4   Period Weeks   Status New  PT Long Term Goals - 04/01/17 1705      PT LONG TERM GOAL #1   Title Patient will reach into cabinet over head and grab a 2lb item without pain    Time 8   Period Weeks   Status New     PT LONG TERM GOAL #2   Title Patient will vaccum at home without pain    Time 8   Period Weeks   Status New     PT LONG TERM GOAL #3   Title Patient will demsotrate a 36 % limitation on FOTO    Time 8   Period Weeks   Status New                Plan - 04/01/17 1654    Clinical Impression Statement Patient is a 66 year old female who presents with right shoulder pain with functional movements. She has weakness with flexion and with ER/IR and in her scapualr muslces. She also has an audible click when returning from flexion. She has pain in her bispes groove. The patient would benefit from skilled therapy toimprove her shouldermechanics and her ability to fucntion.     Clinical Presentation Stable   Clinical Decision Making Low   Rehab Potential Good   PT Frequency 2x / week   PT Duration 8 weeks   PT Treatment/Interventions ADLs/Self Care Home Management;Cryotherapy;Electrical Stimulation;Iontophoresis 4mg /ml Dexamethasone;Gait training;Stair training;Moist Heat;Ultrasound;Therapeutic activities;Therapeutic exercise;Neuromuscular re-education;Patient/family education;Passive range of motion;Manual techniques;Dry needling;Energy conservation;Taping   PT Next Visit Plan add standing ER and IR with t-band; consider supine flexion and d2 flexion; continue with joint mobilization to decrease crepitus and improve range of motion.    PT Home Exercise Plan wand flexion; side lying ER, shoulder extension; scpaular retraction    Consulted and Agree with Plan of Care Patient      Patient will benefit from skilled therapeutic intervention in order to improve the following deficits and impairments:  Decreased strength, Postural dysfunction, Pain,  Impaired UE functional use, Decreased range of motion, Decreased activity tolerance, Decreased endurance  Visit Diagnosis: Chronic right shoulder pain - Plan: PT plan of care cert/re-cert  Stiffness of right shoulder, not elsewhere classified - Plan: PT plan of care cert/re-cert  Muscle weakness (generalized) - Plan: PT plan of care cert/re-cert      G-Codes - 35/46/56 1714    Functional Assessment Tool Used (Outpatient Only) FOTO    Functional Limitation Carrying, moving and handling objects   Carrying, Moving and Handling Objects Current Status (C1275) At least 40 percent but less than 60 percent impaired, limited or restricted   Carrying, Moving and Handling Objects Goal Status (T7001) At least 1 percent but less than 20 percent impaired, limited or restricted       Problem List Patient Active Problem List   Diagnosis Date Noted  . Encounter for Medicare annual wellness exam 03/15/2016  . Elevated BP 11/16/2015  . Vitamin D deficiency 11/16/2015  . Medication management 11/16/2015  . Lower back pain 01/10/2015  . Prediabetes 08/22/2014  . Mixed hyperlipidemia 10/05/2013  . Rosacea   . Anxiety   . 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 Ischemic colitis ([place])     Carney Living PT DPT  04/01/2017, 5:17 PM  [facility] [address] [place], [place], [address] Phone: [phone]   Fax:  [phone]  Name: GRAVIELA NODAL MRN: [id number] Date of Birth: 03/31/1951

## 2017-04-16 ENCOUNTER — Encounter: Payer: Self-pay | Admitting: Physical Therapy

## 2017-04-16 ENCOUNTER — Ambulatory Visit: Payer: Medicare Other | Attending: Internal Medicine | Admitting: Physical Therapy

## 2017-04-16 DIAGNOSIS — M25611 Stiffness of right shoulder, not elsewhere classified: Secondary | ICD-10-CM | POA: Insufficient documentation

## 2017-04-16 DIAGNOSIS — M6281 Muscle weakness (generalized): Secondary | ICD-10-CM | POA: Diagnosis not present

## 2017-04-16 DIAGNOSIS — G8929 Other chronic pain: Secondary | ICD-10-CM | POA: Diagnosis not present

## 2017-04-16 DIAGNOSIS — M25511 Pain in right shoulder: Secondary | ICD-10-CM | POA: Diagnosis not present

## 2017-04-16 NOTE — Therapy (Signed)
[place], [place], [address] Phone: [phone]   Fax:  [phone]  Physical Therapy Treatment  Patient Details  Name: Kathryn Lamb MRN: [id number] Date of Birth: [birth year] Referring Provider: Dr Joni Fears   Encounter Date: 04/16/2017      PT End of Session - 04/16/17 1355    Visit Number 2   Number of Visits 16   Date for PT Re-Evaluation 05/27/17   Authorization Type [facility] MCR    PT Start Time 3818   PT Stop Time 1228   PT Time Calculation (min) 43 min   Activity Tolerance Patient tolerated treatment well   Behavior During Therapy [facility] for tasks assessed/performed      Past Medical History:  Diagnosis Date  . Anxiety   . Depression   . Diverticulitis   . Ischemic colitis ([place])   . Mixed hyperlipidemia 10/05/2013  . Rosacea     Past Surgical History:  Procedure Laterality Date  . ABDOMINAL HYSTERECTOMY    . APPENDECTOMY    . CARPAL TUNNEL RELEASE Right 2004  . LUMBAR LAMINECTOMY  2000  . MICRODISCECTOMY LUMBAR    . TUBAL LIGATION      There were no vitals filed for this visit.      Subjective Assessment - 04/16/17 1150    Subjective Patient rpeorts her shoulder has been just a little sore. She reports this morning she has just a monor ache. The patient is is using her arm. She has to use the ice from time to time.    Limitations House hold activities;Lifting   Diagnostic tests R shoulder MRI: difuse right rotator cuff tendinosis with small interstitial tears. No full thickness tears; Moderate AC joint degeneration    Patient Stated Goals to have less pain   Currently in Pain? No/denies   Pain Score 2    Pain Location Shoulder   Pain Orientation Right                         [facility] Adult PT Treatment/Exercise - 04/16/17 0001      Posture/Postural Control   Posture Comments Forward head      Shoulder Exercises: Supine   Other Supine Exercises wand flexion  2x10      Shoulder Exercises: Sidelying   Other Sidelying Exercises ER 2x10      Shoulder Exercises: Standing   Other Standing Exercises shoulder extension 2x10; scpaular retraction 2x10;    Other Standing Exercises shoulder ER and ER yellow 2x10      Manual Therapy   Manual therapy comments Grade II and III PA; AP and inferior glides to reduce impingement.                 PT Education - 04/16/17 1354    Education provided Yes   Education Details updated HEP; reviewed technique with rotator cuff exercises    Person(s) Educated Patient;Spouse   Methods Demonstration;Explanation;Tactile cues;Verbal cues   Comprehension Verbalized understanding;Returned demonstration;Verbal cues required;Tactile cues required;Need further instruction          PT Short Term Goals - 04/01/17 1704      PT SHORT TERM GOAL #1   Title Patient will demsotrate full pain free ight shoulder flexion    Time 4   Period Weeks   Status New     PT SHORT TERM GOAL #2   Title Patient will demsotrate 5/5 gross right shoulder strength  Time 4   Period Weeks   Status New     PT SHORT TERM GOAL #3   Title Patient will be independent with basic HEP    Time 4   Period Weeks   Status New           PT Long Term Goals - 04/01/17 1705      PT LONG TERM GOAL #1   Title Patient will reach into cabinet over head and grab a 2lb item without pain    Time 8   Period Weeks   Status New     PT LONG TERM GOAL #2   Title Patient will vaccum at home without pain    Time 8   Period Weeks   Status New     PT LONG TERM GOAL #3   Title Patient will demsotrate a 36 % limitation on FOTO    Time 8   Period Weeks   Status New               Plan - 04/16/17 1355    Clinical Impression Statement The patient is making progress. She had a minimal increase in painwith treatment. She had full free range. She does have some grinding at times in her shoulder. Therapy added 2 more RTC strengthening  exercises. She had no increase in pain.    Clinical Presentation Stable   Clinical Decision Making Low   Rehab Potential Good   PT Frequency 2x / week   PT Duration 8 weeks   PT Treatment/Interventions ADLs/Self Care Home Management;Cryotherapy;Electrical Stimulation;Iontophoresis 4mg /ml Dexamethasone;Gait training;Stair training;Moist Heat;Ultrasound;Therapeutic activities;Therapeutic exercise;Neuromuscular re-education;Patient/family education;Passive range of motion;Manual techniques;Dry needling;Energy conservation;Taping   PT Next Visit Plan add standing ER and IR with t-band; consider supine flexion and d2 flexion; continue with joint mobilization to decrease crepitus and improve range of motion.    PT Home Exercise Plan wand flexion; side lying ER, shoulder extension; scpaular retraction    Consulted and Agree with Plan of Care Patient      Patient will benefit from skilled therapeutic intervention in order to improve the following deficits and impairments:  Decreased strength, Postural dysfunction, Pain, Impaired UE functional use, Decreased range of motion, Decreased activity tolerance, Decreased endurance  Visit Diagnosis: Chronic right shoulder pain  Stiffness of right shoulder, not elsewhere classified  Muscle weakness (generalized)     Problem List Patient Active Problem List   Diagnosis Date Noted  . Encounter for Medicare annual wellness exam 03/15/2016  . Elevated BP 11/16/2015  . Vitamin D deficiency 11/16/2015  . Medication management 11/16/2015  . Lower back pain 01/10/2015  . Prediabetes 08/22/2014  . Mixed hyperlipidemia 10/05/2013  . Rosacea   . Anxiety   . 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 Ischemic colitis ([place])     Carney Living PT DPT  04/16/2017, 2:01 PM  [facility] [address] [place], [place], [address] Phone: [phone]   Fax:  [phone]  Name: Kathryn Lamb MRN:  [id number] Date of Birth: 1951/06/07

## 2017-04-23 ENCOUNTER — Encounter: Payer: Medicare Other | Admitting: Physical Therapy

## 2017-04-28 ENCOUNTER — Other Ambulatory Visit: Payer: Self-pay | Admitting: Internal Medicine

## 2017-04-30 ENCOUNTER — Ambulatory Visit: Payer: Medicare Other | Admitting: Physical Therapy

## 2017-04-30 ENCOUNTER — Encounter: Payer: Self-pay | Admitting: Physical Therapy

## 2017-04-30 DIAGNOSIS — M25611 Stiffness of right shoulder, not elsewhere classified: Secondary | ICD-10-CM | POA: Diagnosis not present

## 2017-04-30 DIAGNOSIS — G8929 Other chronic pain: Secondary | ICD-10-CM | POA: Diagnosis not present

## 2017-04-30 DIAGNOSIS — M6281 Muscle weakness (generalized): Secondary | ICD-10-CM | POA: Diagnosis not present

## 2017-04-30 DIAGNOSIS — M25511 Pain in right shoulder: Secondary | ICD-10-CM | POA: Diagnosis not present

## 2017-04-30 NOTE — Patient Instructions (Signed)

## 2017-05-01 NOTE — Therapy (Signed)
[place], [place], [address] Phone: [phone]   Fax:  [phone]  Physical Therapy Treatment  Patient Details  Name: Kathryn Lamb MRN: [id number] Date of Birth: 25-Jan-1951 Referring Provider: Dr Joni Fears   Encounter Date: 04/30/2017      PT End of Session - 05/01/17 1004    Visit Number 3   Number of Visits 16   Date for PT Re-Evaluation 05/27/17   Authorization Type [facility] MCR    PT Start Time 1147   PT Stop Time 1231   PT Time Calculation (min) 44 min   Activity Tolerance Patient tolerated treatment well   Behavior During Therapy [facility] for tasks assessed/performed      Past Medical History:  Diagnosis Date  . Anxiety   . Depression   . Diverticulitis   . Ischemic colitis ([place])   . Mixed hyperlipidemia 10/05/2013  . Rosacea     Past Surgical History:  Procedure Laterality Date  . ABDOMINAL HYSTERECTOMY    . APPENDECTOMY    . CARPAL TUNNEL RELEASE Right 2004  . LUMBAR LAMINECTOMY  2000  . MICRODISCECTOMY LUMBAR    . TUBAL LIGATION      There were no vitals filed for this visit.      Subjective Assessment - 05/01/17 0959    Subjective Patient reports that over the past fewdays her shoulder has been sore. She feells like she over-did her ER strengthening. She then drove a long distance and slept on her shoulder.    Limitations House hold activities;Lifting   Diagnostic tests R shoulder MRI: difuse right rotator cuff tendinosis with small interstitial tears. No full thickness tears; Moderate AC joint degeneration    Patient Stated Goals to have less pain   Currently in Pain? Yes   Pain Score 3    Pain Location Shoulder   Pain Orientation Right;Anterior   Pain Descriptors / Indicators Aching   Pain Onset More than a month ago   Pain Frequency Intermittent   Aggravating Factors  use of her arm; sleeping on her arm    Pain Relieving Factors rest; ice; ibuprofin    Effect of Pain  on Daily Activities difficulty perfroming household tasks                          [facility] Adult PT Treatment/Exercise - 05/01/17 0001      Posture/Postural Control   Posture Comments Forward head      Shoulder Exercises: Supine   Other Supine Exercises wand flexion 2x10      Shoulder Exercises: Sidelying   Other Sidelying Exercises ER 2x10      Shoulder Exercises: Standing   Other Standing Exercises shoulder extension 2x10; scpaular retraction 2x10;    Other Standing Exercises shoulder ER and ER yellow 2x10      Manual Therapy   Manual therapy comments Grade II and III PA; AP and inferior glides to reduce impingement.                 PT Education - 05/01/17 1003    Education provided Yes   Education Details reviewed HEP, reviewed rotator cuff exercises.    Person(s) Educated Patient;Spouse   Methods Explanation;Demonstration;Tactile cues;Verbal cues   Comprehension Verbalized understanding;Returned demonstration;Verbal cues required;Tactile cues required;Need further instruction          PT Short Term Goals - 05/01/17 1013      PT SHORT TERM GOAL #  1   Title Patient will demsotrate full pain free ight shoulder flexion    Time 4   Period Weeks   Status On-going     PT SHORT TERM GOAL #2   Title Patient will demsotrate 5/5 gross right shoulder strength    Time 4   Period Weeks   Status On-going     PT SHORT TERM GOAL #3   Title Patient will be independent with basic HEP    Time 4   Period Weeks   Status On-going           PT Long Term Goals - 04/01/17 1705      PT LONG TERM GOAL #1   Title Patient will reach into cabinet over head and grab a 2lb item without pain    Time 8   Period Weeks   Status New     PT LONG TERM GOAL #2   Title Patient will vaccum at home without pain    Time 8   Period Weeks   Status New     PT LONG TERM GOAL #3   Title Patient will demsotrate a 36 % limitation on FOTO    Time 8   Period Weeks    Status New               Plan - 05/01/17 1010    Clinical Impression Statement Patient had a setback this visit. She was doing well until her recent onset of pain. Therapy reviewed her exercises. She had no significant pain after treatment. Patient had tight ER at the beggining but improved at the end.    Clinical Presentation Stable   Clinical Decision Making Low   Rehab Potential Good   PT Frequency 2x / week   PT Duration 8 weeks   PT Treatment/Interventions ADLs/Self Care Home Management;Cryotherapy;Electrical Stimulation;Iontophoresis 4mg /ml Dexamethasone;Gait training;Stair training;Moist Heat;Ultrasound;Therapeutic activities;Therapeutic exercise;Neuromuscular re-education;Patient/family education;Passive range of motion;Manual techniques;Dry needling;Energy conservation;Taping   PT Next Visit Plan add standing ER and IR with t-band; consider supine flexion and d2 flexion; continue with joint mobilization to decrease crepitus and improve range of motion.    PT Home Exercise Plan wand flexion; side lying ER, shoulder extension; scpaular retraction    Consulted and Agree with Plan of Care Patient      Patient will benefit from skilled therapeutic intervention in order to improve the following deficits and impairments:  Decreased strength, Postural dysfunction, Pain, Impaired UE functional use, Decreased range of motion, Decreased activity tolerance, Decreased endurance  Visit Diagnosis: Chronic right shoulder pain  Stiffness of right shoulder, not elsewhere classified  Muscle weakness (generalized)     Problem List Patient Active Problem List   Diagnosis Date Noted  . Encounter for Medicare annual wellness exam 03/15/2016  . Elevated BP 11/16/2015  . Vitamin D deficiency 11/16/2015  . Medication management 11/16/2015  . Lower back pain 01/10/2015  . Prediabetes 08/22/2014  . Mixed hyperlipidemia 10/05/2013  . Rosacea   . Anxiety   . 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 Ischemic colitis ([place])     Carney Living PT DPT  05/01/2017, 10:15 AM  [facility] [address] [place], [place], [address] Phone: [phone]   Fax:  [phone]  Name: Kathryn Lamb MRN: [id number] Date of Birth: 1950/11/21

## 2017-05-07 ENCOUNTER — Ambulatory Visit: Payer: Medicare Other | Admitting: Physical Therapy

## 2017-05-07 ENCOUNTER — Encounter: Payer: Self-pay | Admitting: Physical Therapy

## 2017-05-07 DIAGNOSIS — M25611 Stiffness of right shoulder, not elsewhere classified: Secondary | ICD-10-CM | POA: Diagnosis not present

## 2017-05-07 DIAGNOSIS — M6281 Muscle weakness (generalized): Secondary | ICD-10-CM

## 2017-05-07 DIAGNOSIS — M25511 Pain in right shoulder: Principal | ICD-10-CM

## 2017-05-07 DIAGNOSIS — G8929 Other chronic pain: Secondary | ICD-10-CM

## 2017-05-07 NOTE — Therapy (Signed)
[place], [place], [address] Phone: [phone]   Fax:  [phone]  Physical Therapy Treatment  Patient Details  Name: Kathryn Lamb MRN: [id number] Date of Birth: [birth year] Referring Provider: Dr Joni Fears   Encounter Date: 05/07/2017      PT End of Session - 05/07/17 1214    Visit Number 4   Number of Visits 16   Date for PT Re-Evaluation 05/27/17   Authorization Type [facility] MCR    PT Start Time 8850   PT Stop Time 1226   PT Time Calculation (min) 41 min   Activity Tolerance Patient tolerated treatment well   Behavior During Therapy [facility] for tasks assessed/performed      Past Medical History:  Diagnosis Date  . Anxiety   . Depression   . Diverticulitis   . Ischemic colitis ([place])   . Mixed hyperlipidemia 10/05/2013  . Rosacea     Past Surgical History:  Procedure Laterality Date  . ABDOMINAL HYSTERECTOMY    . APPENDECTOMY    . CARPAL TUNNEL RELEASE Right 2004  . LUMBAR LAMINECTOMY  2000  . MICRODISCECTOMY LUMBAR    . TUBAL LIGATION      There were no vitals filed for this visit.      Subjective Assessment - 05/07/17 1151    Subjective IMproved pain since last visit. Patient has beenavoinding reaching overhead. She continues to do her exercises and feels like she has been having less pain with them since reviewing her technique.   Limitations House hold activities;Lifting   Diagnostic tests R shoulder MRI: difuse right rotator cuff tendinosis with small interstitial tears. No full thickness tears; Moderate AC joint degeneration    Patient Stated Goals to have less pain   Currently in Pain? Yes   Pain Score 3    Pain Location Shoulder   Pain Orientation Right;Anterior   Pain Descriptors / Indicators Aching   Pain Onset More than a month ago   Pain Frequency Intermittent   Aggravating Factors  useof the arm    Pain Relieving Factors rest, ice, ibuprofin    Effect of Pain on  Daily Activities is not reaching overhead                          [facility] Adult PT Treatment/Exercise - 05/07/17 0001      Shoulder Exercises: Supine   Other Supine Exercises wand flexion 2x10      Shoulder Exercises: Sidelying   Other Sidelying Exercises ER 2x10 1 lb      Shoulder Exercises: Standing   Other Standing Exercises shoulder extension 2x10; scpaular retraction 2x10;    Other Standing Exercises shoulder ER yellow and IR red 2x10; UE range into flexion      Manual Therapy   Manual therapy comments Grade II and III PA; AP and inferior glides to reduce impingement.                 PT Education - 05/07/17 1214    Education provided Yes   Education Details reviewed technique with ther-ex    Person(s) Educated Patient   Methods Explanation;Demonstration;Tactile cues;Verbal cues   Comprehension Verbalized understanding;Returned demonstration;Verbal cues required;Tactile cues required          PT Short Term Goals - 05/01/17 1013      PT SHORT TERM GOAL #1   Title Patient will demsotrate full pain free ight shoulder flexion  Time 4   Period Weeks   Status On-going     PT SHORT TERM GOAL #2   Title Patient will demsotrate 5/5 gross right shoulder strength    Time 4   Period Weeks   Status On-going     PT SHORT TERM GOAL #3   Title Patient will be independent with basic HEP    Time 4   Period Weeks   Status On-going           PT Long Term Goals - 04/01/17 1705      PT LONG TERM GOAL #1   Title Patient will reach into cabinet over head and grab a 2lb item without pain    Time 8   Period Weeks   Status New     PT LONG TERM GOAL #2   Title Patient will vaccum at home without pain    Time 8   Period Weeks   Status New     PT LONG TERM GOAL #3   Title Patient will demsotrate a 36 % limitation on FOTO    Time 8   Period Weeks   Status New               Plan - 05/07/17 1422    Clinical Impression Statement  Patient required much less cuing for technique with ther-ex today. She had no increase in pain with treatment. She was afdvised to use her arm as able. Not using it all all will only make it weaker. Therapy also perfromed STM to her upper trap. She had no increase in pain.    Clinical Presentation Stable   Clinical Decision Making Low   Rehab Potential Good   PT Frequency 2x / week   PT Duration 8 weeks   PT Treatment/Interventions ADLs/Self Care Home Management;Cryotherapy;Electrical Stimulation;Iontophoresis 4mg /ml Dexamethasone;Gait training;Stair training;Moist Heat;Ultrasound;Therapeutic activities;Therapeutic exercise;Neuromuscular re-education;Patient/family education;Passive range of motion;Manual techniques;Dry needling;Energy conservation;Taping   PT Next Visit Plan add standing ER and IR with t-band; consider supine flexion and d2 flexion; continue with joint mobilization to decrease crepitus and improve range of motion.    PT Home Exercise Plan wand flexion; side lying ER, shoulder extension; scpaular retraction    Consulted and Agree with Plan of Care Patient      Patient will benefit from skilled therapeutic intervention in order to improve the following deficits and impairments:  Decreased strength, Postural dysfunction, Pain, Impaired UE functional use, Decreased range of motion, Decreased activity tolerance, Decreased endurance  Visit Diagnosis: Chronic right shoulder pain  Stiffness of right shoulder, not elsewhere classified  Muscle weakness (generalized)     Problem List Patient Active Problem List   Diagnosis Date Noted  . Encounter for Medicare annual wellness exam 03/15/2016  . Elevated BP 11/16/2015  . Vitamin D deficiency 11/16/2015  . Medication management 11/16/2015  . Lower back pain 01/10/2015  . Prediabetes 08/22/2014  . Mixed hyperlipidemia 10/05/2013  . Rosacea   . Anxiety   . 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[place])   . Ischemic colitis [facility])      Carney Living 05/07/2017, 2:36 PM  [facility] [address] [place], [place], [address] Phone: [phone]   Fax:  [phone]  Name: Kathryn Lamb MRN: [id number] Date of Birth: 1951/02/05

## 2017-05-13 ENCOUNTER — Encounter: Payer: Medicare Other | Admitting: Physical Therapy

## 2017-05-15 ENCOUNTER — Ambulatory Visit: Payer: Medicare Other | Attending: Internal Medicine

## 2017-05-15 DIAGNOSIS — M25611 Stiffness of right shoulder, not elsewhere classified: Secondary | ICD-10-CM

## 2017-05-15 DIAGNOSIS — G8929 Other chronic pain: Secondary | ICD-10-CM | POA: Insufficient documentation

## 2017-05-15 DIAGNOSIS — M6281 Muscle weakness (generalized): Secondary | ICD-10-CM | POA: Diagnosis not present

## 2017-05-15 DIAGNOSIS — M25511 Pain in right shoulder: Secondary | ICD-10-CM | POA: Diagnosis not present

## 2017-05-15 NOTE — Therapy (Signed)
[place], [place], [address] Phone: [phone]   Fax:  [phone]  Physical Therapy Treatment  Patient Details  Name: Kathryn Lamb MRN: [id number] Date of Birth: Jan 13, 1951 Referring Provider: Dr Joni Fears   Encounter Date: 05/15/2017      PT End of Session - 05/15/17 1150    Visit Number 5   Number of Visits 16   Date for PT Re-Evaluation 05/27/17   Authorization Type [facility] MCR    PT Start Time 4627   PT Stop Time 1230   PT Time Calculation (min) 45 min   Activity Tolerance Patient tolerated treatment well   Behavior During Therapy [facility] for tasks assessed/performed      Past Medical History:  Diagnosis Date  . Anxiety   . Depression   . Diverticulitis   . Ischemic colitis ([place])   . Mixed hyperlipidemia 10/05/2013  . Rosacea     Past Surgical History:  Procedure Laterality Date  . ABDOMINAL HYSTERECTOMY    . APPENDECTOMY    . CARPAL TUNNEL RELEASE Right 2004  . LUMBAR LAMINECTOMY  2000  . MICRODISCECTOMY LUMBAR    . TUBAL LIGATION      There were no vitals filed for this visit.      Subjective Assessment - 05/15/17 1149    Subjective RT shoulder pain.  Pain comes and goes . Exercises really help.     Currently in Pain? Yes   Pain Score 2    Pain Location Shoulder   Pain Orientation Right;Anterior   Pain Descriptors / Indicators Aching   Pain Type Chronic pain   Pain Onset More than a month ago   Pain Frequency Intermittent   Aggravating Factors  using arm   Pain Relieving Factors rest, ice   Multiple Pain Sites No                         [facility] Adult PT Treatment/Exercise - 05/15/17 0001      Shoulder Exercises: Supine   Other Supine Exercises wand flexion 2x5 with arms straight and from shoulders , stopped due to catching      Shoulder Exercises: Standing   Other Standing Exercises shoulder extension 2x10; scpaular retraction 2x10; and ER  bilaterally with green band     Manual Therapy   Manual therapy comments Grade II and III PA; AP and inferior glides to reduce impingement. STW to pectorals                  PT Short Term Goals - 05/15/17 1151      PT SHORT TERM GOAL #1   Title Patient will demsotrate full pain free ight shoulder flexion    Baseline Able to lift equal to LT but with some pain   Status On-going     PT SHORT TERM GOAL #2   Title Patient will demsotrate 5/5 gross right shoulder strength    Status On-going     PT SHORT TERM GOAL #3   Title Patient will be independent with basic HEP    Status Achieved           PT Long Term Goals - 04/01/17 1705      PT LONG TERM GOAL #1   Title Patient will reach into cabinet over head and grab a 2lb item without pain    Time 8   Period Weeks   Status New     PT  LONG TERM GOAL #2   Title Patient will vaccum at home without pain    Time 8   Period Weeks   Status New     PT LONG TERM GOAL #3   Title Patient will demsotrate a 36 % limitation on FOTO    Time 8   Period Weeks   Status New               Plan - 05/15/17 1244    Clinical Impression Statement She reported decreased pain after session without need for icing . Issued green band for HEP and caution to stop if painful and use lighter band. continues to get catching pain with reaching overhead.    PT Treatment/Interventions ADLs/Self Care Home Management;Cryotherapy;Electrical Stimulation;Iontophoresis 4mg /ml Dexamethasone;Gait training;Stair training;Moist Heat;Ultrasound;Therapeutic activities;Therapeutic exercise;Neuromuscular re-education;Patient/family education;Passive range of motion;Manual techniques;Dry needling;Energy conservation;Taping   PT Next Visit Plan add standing ER and IR with t-band; consider supine flexion and d2 flexion; continue with joint mobilization to decrease crepitus and improve range of motion.    PT Home Exercise Plan wand flexion; side lying ER,  shoulder extension; scpaular retraction    Consulted and Agree with Plan of Care Patient      Patient will benefit from skilled therapeutic intervention in order to improve the following deficits and impairments:  Decreased strength, Postural dysfunction, Pain, Impaired UE functional use, Decreased range of motion, Decreased activity tolerance, Decreased endurance  Visit Diagnosis: Chronic right shoulder pain  Stiffness of right shoulder, not elsewhere classified  Muscle weakness (generalized)     Problem List Patient Active Problem List   Diagnosis Date Noted  . Encounter for Medicare annual wellness exam 03/15/2016  . Elevated BP 11/16/2015  . Vitamin D deficiency 11/16/2015  . Medication management 11/16/2015  . Lower back pain 01/10/2015  . Prediabetes 08/22/2014  . Mixed hyperlipidemia 10/05/2013  . Rosacea   . Anxiety   . 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[place])   . Ischemic colitis [facility])     Darrel Hoover  PT 05/15/2017, 12:53 PM  [facility] [address] [place], [place], [address] Phone: [phone]   Fax:  [phone]  Name: Kathryn Lamb MRN: [id number] Date of Birth: Apr 14, 1951

## 2017-05-20 ENCOUNTER — Encounter: Payer: Medicare Other | Admitting: Physical Therapy

## 2017-05-22 ENCOUNTER — Encounter: Payer: Medicare Other | Admitting: Physical Therapy

## 2017-05-28 ENCOUNTER — Encounter: Payer: Medicare Other | Admitting: Physical Therapy

## 2017-05-30 ENCOUNTER — Encounter: Payer: Self-pay | Admitting: Physician Assistant

## 2017-05-30 ENCOUNTER — Ambulatory Visit (INDEPENDENT_AMBULATORY_CARE_PROVIDER_SITE_OTHER): Payer: Medicare Other | Admitting: Physician Assistant

## 2017-05-30 VITALS — BP 124/68 | HR 65 | Temp 97.2°F | Resp 14 | Ht 64.0 in | Wt 144.0 lb

## 2017-05-30 DIAGNOSIS — Z0000 Encounter for general adult medical examination without abnormal findings: Secondary | ICD-10-CM

## 2017-05-30 DIAGNOSIS — Z0001 Encounter for general adult medical examination with abnormal findings: Secondary | ICD-10-CM

## 2017-05-30 DIAGNOSIS — L719 Rosacea, unspecified: Secondary | ICD-10-CM

## 2017-05-30 DIAGNOSIS — E559 Vitamin D deficiency, unspecified: Secondary | ICD-10-CM

## 2017-05-30 DIAGNOSIS — I10 Essential (primary) hypertension: Secondary | ICD-10-CM | POA: Diagnosis not present

## 2017-05-30 DIAGNOSIS — Z23 Encounter for immunization: Secondary | ICD-10-CM

## 2017-05-30 DIAGNOSIS — K559 Vascular disorder of intestine, unspecified: Secondary | ICD-10-CM | POA: Diagnosis not present

## 2017-05-30 DIAGNOSIS — Z79899 Other long term (current) drug therapy: Secondary | ICD-10-CM | POA: Diagnosis not present

## 2017-05-30 DIAGNOSIS — E782 Mixed hyperlipidemia: Secondary | ICD-10-CM | POA: Diagnosis not present

## 2017-05-30 DIAGNOSIS — M5440 Lumbago with sciatica, unspecified side: Secondary | ICD-10-CM | POA: Diagnosis not present

## 2017-05-30 DIAGNOSIS — R7303 Prediabetes: Secondary | ICD-10-CM | POA: Diagnosis not present

## 2017-05-30 DIAGNOSIS — F419 Anxiety disorder, unspecified: Secondary | ICD-10-CM

## 2017-05-30 DIAGNOSIS — R6889 Other general symptoms and signs: Secondary | ICD-10-CM | POA: Diagnosis not present

## 2017-05-30 DIAGNOSIS — F3342 Major depressive disorder, recurrent, in full remission: Secondary | ICD-10-CM

## 2017-05-30 MED ORDER — ATORVASTATIN CALCIUM 40 MG PO TABS: 40.0000 mg | ORAL_TABLET | Freq: Every day | ORAL | 0 refills | Status: DC

## 2017-05-30 NOTE — Patient Instructions (Addendum)
Your LDL is the bad cholesterol that can lead to heart attack and stroke. To lower your number you can decrease your fatty foods, red meat, cheese, milk and increase fiber like whole grains and veggies. You can also add a fiber supplement like Citracel or Benefiber, these do not cause gas and bloating and are safe to use.     Dyslipidemia Dyslipidemia is an imbalance of waxy, fat-like substances (lipids) in the blood. The body needs lipids in small amounts. Dyslipidemia often involves a high level of cholesterol or triglycerides, which are types of lipids. Common forms of dyslipidemia include:  High levels of bad cholesterol (LDL cholesterol). LDL is the type of cholesterol that causes fatty deposits (plaques) to build up in the blood vessels that carry blood away from your heart (arteries).  Low levels of good cholesterol (HDL cholesterol). HDL cholesterol is the type of cholesterol that protects against heart disease. High levels of HDL remove the LDL buildup from arteries.  High levels of triglycerides. Triglycerides are a fatty substance in the blood that is linked to a buildup of plaques in the arteries.  You can develop dyslipidemia because of the genes you are born with (primary dyslipidemia) or changes that occur during your life (secondary dyslipidemia), or as a side effect of certain medical treatments. What are the causes? Primary dyslipidemia is caused by changes (mutations) in genes that are passed down through families (inherited). These mutations cause several types of dyslipidemia. Mutations can result in disorders that make the body produce too much LDL cholesterol or triglycerides, or not enough HDL cholesterol. These disorders may lead to heart disease, arterial disease, or stroke at an early age. Causes of secondary dyslipidemia include certain lifestyle choices and diseases that lead to dyslipidemia, such as:  Eating a diet that is high in animal fat.  Not getting enough  activity or exercise (having a sedentary lifestyle).  Having diabetes, kidney disease, liver disease, or thyroid disease.  Drinking large amounts of alcohol.  Using certain types of drugs.  What increases the risk? You may be at greater risk for dyslipidemia if you are an older man or if you are a woman who has gone through menopause. Other risk factors include:  Having a family history of dyslipidemia.  Taking certain medicines, including birth control pills, steroids, some diuretics, beta-blockers, and some medicines forHIV.  Smoking cigarettes.  Eating a high-fat diet.  Drinking large amounts of alcohol.  Having certain medical conditions such as diabetes, polycystic ovary syndrome (PCOS), pregnancy, kidney disease, liver disease, or hypothyroidism.  Not exercising regularly.  Being overweight or obese with too much belly fat.  What are the signs or symptoms? Dyslipidemia does not usually cause any symptoms. Very high lipid levels can cause fatty bumps under the skin (xanthomas) or a white or gray ring around the black center (pupil) of the eye. Very high triglyceride levels can cause inflammation of the pancreas (pancreatitis). How is this diagnosed? Your health care provider may diagnose dyslipidemia based on a routine blood test (fasting blood test). Because most people do not have symptoms of the condition, this blood testing (lipid profile) is done on adults age 51 and older and is repeated every 5 years. This test checks:  Total cholesterol. This is a measure of the total amount of cholesterol in your blood, including LDL cholesterol, HDL cholesterol, and triglycerides. A healthy number is below 200.  LDL cholesterol. The target number for LDL cholesterol is different for each person, depending on individual  risk factors. For most people, a number below 100 is healthy. Ask your health care provider what your LDL cholesterol number should be.  HDL cholesterol. An HDL  level of 60 or higher is best because it helps to protect against heart disease. A number below 61 for men or below 13 for women increases the risk for heart disease.  Triglycerides. A healthy triglyceride number is below 150.  If your lipid profile is abnormal, your health care provider may do other blood tests to get more information about your condition. How is this treated? Treatment depends on the type of dyslipidemia that you have and your other risk factors for heart disease and stroke. Your health care provider will have a target range for your lipid levels based on this information. For many people, treatment starts with lifestyle changes, such as diet and exercise. Your health care provider may recommend that you:  Get regular exercise.  Make changes to your diet.  Quit smoking if you smoke.  If diet changes and exercise do not help you reach your goals, your health care provider may also prescribe medicine to lower lipids. The most commonly prescribed type of medicine lowers your LDL cholesterol (statin drug). If you have a high triglyceride level, your provider may prescribe another type of drug (fibrate) or an omega-3 fish oil supplement, or both. Follow these instructions at home:  Take over-the-counter and prescription medicines only as told by your health care provider. This includes supplements.  Get regular exercise. Start an aerobic exercise and strength training program as told by your health care provider. Ask your health care provider what activities are safe for you. Your health care provider may recommend: ? 30 minutes of aerobic activity 4-6 days a week. Brisk walking is an example of aerobic activity. ? Strength training 2 days a week.  Eat a healthy diet as told by your health care provider. This can help you reach and maintain a healthy weight, lower your LDL cholesterol, and raise your HDL cholesterol. It may help to work with a diet and nutrition specialist  (dietitian) to make a plan that is right for you. Your dietitian or health care provider may recommend: ? Limiting your calories, if you are overweight. ? Eating more fruits, vegetables, whole grains, fish, and lean meats. ? Limiting saturated fat, trans fat, and cholesterol.  Follow instructions from your health care provider or dietitian about eating or drinking restrictions.  Limit alcohol intake to no more than one drink per day for nonpregnant women and two drinks per day for men. One drink equals 12 oz of beer, 5 oz of wine, or 1 oz of hard liquor.  Do not use any products that contain nicotine or tobacco, such as cigarettes and e-cigarettes. If you need help quitting, ask your health care provider.  Keep all follow-up visits as told by your health care provider. This is important. Contact a health care provider if:  You are having trouble sticking to your exercise or diet plan.  You are struggling to quit smoking or control your use of alcohol. Summary  Dyslipidemia is an imbalance of waxy, fat-like substances (lipids) in the blood. The body needs lipids in small amounts. Dyslipidemia often involves a high level of cholesterol or triglycerides, which are types of lipids.  Treatment depends on the type of dyslipidemia that you have and your other risk factors for heart disease and stroke.  For many people, treatment starts with lifestyle changes, such as diet and exercise.  Your health care provider may also prescribe medicine to lower lipids. This information is not intended to replace advice given to you by your health care provider. Make sure you discuss any questions you have with your health care provider. Document Released: 08/03/2013 Document Revised: 03/25/2016 Document Reviewed: 03/25/2016 Elsevier Interactive Patient Education  [employer].

## 2017-05-30 NOTE — Progress Notes (Signed)
MEDICARE WELLNESS VISIT AND FOLLOW UP  Assessment:   Mixed hyperlipidemia -continue medications, check lipids, decrease fatty foods, increase activity.  - Lipid panel   Prediabetes Discussed general issues about diabetes pathophysiology and management., Educational material distributed., Suggested low cholesterol diet., Encouraged aerobic exercise., Discussed foot care., Reminded to get yearly retinal exam.  Depression, remission Depression- continue medications, stress management techniques discussed, increase water, good sleep hygiene discussed, increase exercise, and increase veggies.   Anxiety controlled   Rosacea Follows with Derm  Ischemic colitis Controlled, avoid triggers, continue follow up GI  Vitamin D deficiency Continue supplement  Medication management - Magnesium  Bilateral low back pain with sciatica, sciatica laterality unspecified Controlled, continue gabapentin PRN  Elevated blood pressure reading without diagnosis of hypertension - CBC with Differential/Platelet - BASIC METABOLIC PANEL WITH GFR - Hepatic function panel - TSH  Need for Shingles vaccine Given today  Over 30 minutes of exam, counseling, chart review, and critical decision making was performed  Plan:   During the course of the visit the patient was educated and counseled about appropriate screening and preventive services including:    Pneumococcal vaccine   Influenza vaccine  Td vaccine  Screening electrocardiogram  Screening mammography  Bone densitometry screening  Colorectal cancer screening  Diabetes screening  Glaucoma screening  Nutrition counseling   Advanced directives: given info/requested   Subjective:   Kathryn Lamb is a 66 y.o. female who presents for Welcome to Medicare Visit and 3 month follow up on hypertension, prediabetes, hyperlipidemia, vitamin D def.   She is in PT for her right shoulder and states it is helping.   Her blood pressure  has been controlled at home, today their BP is BP: 124/68 She does workout. She denies chest pain, shortness of breath, dizziness.  She is on cholesterol medication, she is on lipitor 40mg  daily x 3 months, she did not start the zetia, and denies myalgias. Her cholesterol is at goal. The cholesterol last visit was:   Lab Results  Component Value Date   CHOL 256 (H) 02/18/2017   HDL 62 02/18/2017   LDLCALC 147 (H) 02/18/2017   TRIG 234 (H) 02/18/2017   CHOLHDL 4.1 02/18/2017   She has been working on diet and exercise for prediabetes, and denies paresthesia of the feet, polydipsia, polyuria and visual disturbances. Last A1C in the office was:  Lab Results  Component Value Date   HGBA1C 5.7 (H) 02/18/2017   Patient is on Vitamin D supplement. Lab Results  Component Value Date   VD25OH 67 02/18/2017     She is on wellbutrin XL 300mg  for depression and anxiety which helps, she states that her memory is unchanged, she adopted 3 kittens.  She is on zyrtec for allergies.  She has lower back pain with occ pain in her legs and is on gabapentin for this which helps.  BMI is Body mass index is 24.72 kg/m., she is working on diet and exercise. Wt Readings from Last 3 Encounters:  05/30/17 144 lb (65.3 kg)  03/13/17 145 lb (65.8 kg)  02/18/17 145 lb (65.8 kg)    Medication Review Current Outpatient Prescriptions on File Prior to Visit  Medication Sig Dispense Refill  . atorvastatin (LIPITOR) 80 MG tablet TAKE 1 TABLET BY MOUTH  DAILY 90 tablet 1  . buPROPion (WELLBUTRIN XL) 300 MG 24 hr tablet TAKE 1 TABLET BY MOUTH  DAILY 90 tablet 1  . cetirizine (ZYRTEC) 5 MG tablet Take 5 mg by mouth daily.    Marland Kitchen  Cholecalciferol (VITAMIN D) 2000 UNITS tablet Take 2,000 Units by mouth daily.    . diclofenac sodium (VOLTAREN) 1 % GEL Apply 2 g topically 4 (four) times daily. 3 Tube 1  . Flaxseed, Linseed, (FLAX SEED OIL) 1000 MG CAPS Take 2,600 mg by mouth daily.     Marland Kitchen gabapentin (NEURONTIN) 100 MG  capsule TAKE 2 CAPSULES BY MOUTH 3  TIMES DAILY 540 capsule 1  . Multiple Vitamin (MULTIVITAMIN WITH MINERALS) TABS tablet Take 1 tablet by mouth. Takes 3 times per week. M,W,F    . Omega-3 Fatty Acids (FISH OIL) 1000 MG CAPS Take 3,000 mg by mouth daily.    . Probiotic Product (PROBIOTIC DAILY PO) Take by mouth daily.     No current facility-administered medications on file prior to visit.     Current Problems (verified) Patient Active Problem List   Diagnosis Date Noted  . Encounter for Medicare annual wellness exam 03/15/2016  . Hypertension 11/16/2015  . Vitamin D deficiency 11/16/2015  . Medication management 11/16/2015  . Lower back pain 01/10/2015  . Prediabetes 08/22/2014  . Mixed hyperlipidemia 10/05/2013  . Rosacea   . Anxiety   . 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 Ischemic colitis ([place])     Screening Tests Immunization History  Administered Date(s) Administered  . Influenza Split 05/17/2013, 05/17/2014  . Influenza, High Dose Seasonal PF 05/09/2015, 06/18/2016, 05/30/2017  . PPD Test 05/04/2014  . Pneumococcal Conjugate-13 05/09/2015  . Pneumococcal Polysaccharide-23 10/04/2013  . Td 08/30/2002  . Tdap 04/07/2012  . Varicella Zoster Immune Globulin 03/15/2016   Preventative care: Last colonoscopy: 2013 has OV in Dec Last mammogram: 07/2016 Last pap smear/pelvic exam: 2014 DEXA:2016 CXR 2013 MRI lumbar spine 2007 MRI right shoulder 02/2017  Prior vaccinations: TD or Tdap: 2013  Influenza: TODAY Pneumococcal: 09/2013 Prevnar13: 2016 Shingles/Zostavax: 2017   Names of Other Physician/Practitioners you currently use: 1. [facility]- here for primary care 2. Dr. Joya San, eye doctor, last visit 2017, glasses 3. Dr. Eula Listen, dentist, last visit May 2018 4. Laurin Coder, OB/GYN Patient Care Team: Unk Pinto, MD as PCP - General (Internal Medicine) Richmond Campbell, MD as Consulting Physician  (Gastroenterology) Druscilla Brownie, MD as Consulting Physician (Dermatology) Newman Pies, MD as Consulting Physician (Neurosurgery)  Allergies Allergies  Allergen Reactions  . Doxycycline   . Erythromycin   . Flagyl [Metronidazole]   . Penicillins   . Sulfa Antibiotics     SURGICAL HISTORY She  has a past surgical history that includes Abdominal hysterectomy; Tubal ligation; Lumbar laminectomy (2000); Carpal tunnel release (Right, 2004); Appendectomy; and Microdiscectomy lumbar. FAMILY HISTORY Her family history includes Alcohol abuse in her father; Cancer in her father; Diabetes in her father and sister; Heart disease in her mother; Hypertension in her mother; Stroke in her mother. SOCIAL HISTORY She  reports that she quit smoking about 8 years ago. Her smoking use included Cigarettes. She smoked 1.00 pack per day. She has never used smokeless tobacco. She reports that she does not drink alcohol or use drugs.  MEDICARE WELLNESS OBJECTIVES: Tobacco use: She does not smoke.  Patient is a former smoker. Alcohol Current alcohol use: none Diet: in general, a "healthy" diet   Physical activity: walking Depression/mood screen:   Depression screen PHQ 2/9 02/17/2017  Decreased Interest 0  Down, Depressed, Hopeless 0  PHQ - 2 Score 0   Hearing: normal Visual acuity: normal,  does not perform annual eye exam  ADLs:  In  your present state of health, do you have any difficulty performing the following activities: 02/17/2017  Hearing? N  Vision? N  Difficulty concentrating or making decisions? N  Walking or climbing stairs? N  Dressing or bathing? N  Doing errands, shopping? N  Some recent data might be hidden    Fall risk: Low Risk EOL planning: Does Patient Have a Medical Advance Directive?: No Would patient like information on creating a medical advance directive?: No - Patient declined   Review of Systems  Constitutional: Negative.   HENT: Negative.   Eyes: Negative.    Respiratory: Negative.   Cardiovascular: Negative.   Gastrointestinal: Negative.   Genitourinary: Negative.   Musculoskeletal: Negative.   Skin: Negative.   Neurological: Negative.   Endo/Heme/Allergies: Negative.   Psychiatric/Behavioral: Negative.      Objective:   Today's Vitals   05/30/17 1110  BP: 124/68  Pulse: 65  Resp: 14  Temp: (!) 97.2 F (36.2 C)  SpO2: 99%  Weight: 144 lb (65.3 kg)  Height: 5\' 4"  (1.626 m)  PainSc: 3     General appearance: alert, no distress, WD/WN,  female HEENT: normocephalic, sclerae anicteric, TMs pearly, nares patent, no discharge or erythema, pharynx normal Oral cavity: MMM, no lesions Neck: supple, no lymphadenopathy, no thyromegaly, no masses Heart: RRR, normal S1, S2, no murmurs Lungs: CTA bilaterally, no wheezes, rhonchi, or rales Abdomen: +bs, soft, non tender, non distended, no masses, no hepatomegaly, no splenomegaly Musculoskeletal: nontender, no swelling, no obvious deformity Extremities: no edema, no cyanosis, no clubbing Pulses: 2+ symmetric, upper and lower extremities, normal cap refill Neurological: alert, oriented x 3, CN2-12 intact, strength normal upper extremities and lower extremities, sensation normal throughout, DTRs 2+ throughout, no cerebellar signs, gait normal Psychiatric: normal affect, behavior normal, pleasant  Breast: defer Gyn: defer Rectal: defer   Medicare Attestation I have personally reviewed: The patient's medical and social history Their use of alcohol, tobacco or illicit drugs Their current medications and supplements The patient's functional ability including ADLs,fall risks, home safety risks, cognitive, and hearing and visual impairment Diet and physical activities Evidence for depression or mood disorders  The patient's weight, height, BMI, and visual acuity have been recorded in the chart.  I have made referrals, counseling, and provided education to the patient based on review of the  above and I have provided the patient with a written personalized care plan for preventive services.     Vicie Mutters, PA-C   05/30/2017

## 2017-05-31 LAB — HEMOGLOBIN A1C
HEMOGLOBIN A1C: 5.5 %{Hb} (ref <5.7)
MEAN PLASMA GLUCOSE: 111 (calc)
eAG (mmol/L): 6.2 (calc)

## 2017-05-31 LAB — HEPATIC FUNCTION PANEL
AG Ratio: 2.1 (calc) (ref 1.0–2.5)
ALKALINE PHOSPHATASE (APISO): 52 U/L (ref 33–130)
ALT: 23 U/L (ref 6–29)
AST: 21 U/L (ref 10–35)
Albumin: 4.8 g/dL (ref 3.6–5.1)
BILIRUBIN INDIRECT: 0.8 mg/dL (ref 0.2–1.2)
Bilirubin, Direct: 0.1 mg/dL (ref 0.0–0.2)
Globulin: 2.3 g/dL (calc) (ref 1.9–3.7)
TOTAL PROTEIN: 7.1 g/dL (ref 6.1–8.1)
Total Bilirubin: 0.9 mg/dL (ref 0.2–1.2)

## 2017-05-31 LAB — LIPID PANEL
CHOL/HDL RATIO: 3.6 (calc) (ref <5.0)
CHOLESTEROL: 190 mg/dL (ref <200)
HDL: 53 mg/dL (ref >50)
LDL CHOLESTEROL (CALC): 108 mg/dL — AB
NON-HDL CHOLESTEROL (CALC): 137 mg/dL — AB (ref <130)
Triglycerides: 171 mg/dL — ABNORMAL HIGH (ref <150)

## 2017-05-31 LAB — CBC WITH DIFFERENTIAL/PLATELET
BASOS ABS: 28 {cells}/uL (ref 0–200)
Basophils Relative: 0.5 %
EOS PCT: 3.7 %
Eosinophils Absolute: 207 cells/uL (ref 15–500)
HCT: 36.5 % (ref 35.0–45.0)
Hemoglobin: 12.5 g/dL (ref 11.7–15.5)
Lymphs Abs: 2660 cells/uL (ref 850–3900)
MCH: 30.6 pg (ref 27.0–33.0)
MCHC: 34.2 g/dL (ref 32.0–36.0)
MCV: 89.5 fL (ref 80.0–100.0)
MONOS PCT: 6.8 %
MPV: 9.4 fL (ref 7.5–12.5)
NEUTROS PCT: 41.5 %
Neutro Abs: 2324 cells/uL (ref 1500–7800)
Platelets: 274 10*3/uL (ref 140–400)
RBC: 4.08 10*6/uL (ref 3.80–5.10)
RDW: 12.2 % (ref 11.0–15.0)
TOTAL LYMPHOCYTE: 47.5 %
WBC mixed population: 381 cells/uL (ref 200–950)
WBC: 5.6 10*3/uL (ref 3.8–10.8)

## 2017-05-31 LAB — BASIC METABOLIC PANEL WITH GFR
BUN: 12 mg/dL (ref 7–25)
CHLORIDE: 104 mmol/L (ref 98–110)
CO2: 29 mmol/L (ref 20–32)
Calcium: 10 mg/dL (ref 8.6–10.4)
Creat: 0.78 mg/dL (ref 0.50–0.99)
GFR, Est African American: 91 mL/min/{1.73_m2} (ref >=60)
GFR, Est Non African American: 79 mL/min/{1.73_m2} (ref >=60)
GLUCOSE: 89 mg/dL (ref 65–99)
Potassium: 5.1 mmol/L (ref 3.5–5.3)
SODIUM: 141 mmol/L (ref 135–146)

## 2017-05-31 LAB — TSH: TSH: 2.6 m[IU]/L (ref 0.40–4.50)

## 2017-05-31 LAB — MAGNESIUM: MAGNESIUM: 2.2 mg/dL (ref 1.5–2.5)

## 2017-06-04 DIAGNOSIS — L720 Epidermal cyst: Secondary | ICD-10-CM | POA: Diagnosis not present

## 2017-06-04 DIAGNOSIS — L718 Other rosacea: Secondary | ICD-10-CM | POA: Diagnosis not present

## 2017-06-04 DIAGNOSIS — X32XXXD Exposure to sunlight, subsequent encounter: Secondary | ICD-10-CM | POA: Diagnosis not present

## 2017-06-04 DIAGNOSIS — L570 Actinic keratosis: Secondary | ICD-10-CM | POA: Diagnosis not present

## 2017-06-05 ENCOUNTER — Ambulatory Visit: Payer: Medicare Other | Admitting: Physical Therapy

## 2017-06-05 DIAGNOSIS — M25511 Pain in right shoulder: Principal | ICD-10-CM

## 2017-06-05 DIAGNOSIS — G8929 Other chronic pain: Secondary | ICD-10-CM | POA: Diagnosis not present

## 2017-06-05 DIAGNOSIS — M25611 Stiffness of right shoulder, not elsewhere classified: Secondary | ICD-10-CM

## 2017-06-05 DIAGNOSIS — M6281 Muscle weakness (generalized): Secondary | ICD-10-CM

## 2017-06-06 NOTE — Therapy (Signed)
[place], [place], [address] Phone: [phone]   Fax:  [phone]  Physical Therapy Treatment/ Discharge   Patient Details  Name: Kathryn Lamb MRN: [id number] Date of Birth: [birth year] Referring Provider: Dr Joni Fears   Encounter Date: 06/05/2017      PT End of Session - 06/06/17 0751    Visit Number 6   Number of Visits 16   Date for PT Re-Evaluation 05/27/17   Authorization Type [facility] MCR    PT Start Time 1147   PT Stop Time 1230   PT Time Calculation (min) 43 min   Activity Tolerance Patient tolerated treatment well   Behavior During Therapy [facility] for tasks assessed/performed      Past Medical History:  Diagnosis Date  . Anxiety   . Depression   . Diverticulitis   . Ischemic colitis ([place])   . Mixed hyperlipidemia 10/05/2013  . Rosacea     Past Surgical History:  Procedure Laterality Date  . ABDOMINAL HYSTERECTOMY    . APPENDECTOMY    . CARPAL TUNNEL RELEASE Right 2004  . LUMBAR LAMINECTOMY  2000  . MICRODISCECTOMY LUMBAR    . TUBAL LIGATION      There were no vitals filed for this visit.      Subjective Assessment - 06/06/17 0748    Subjective Patient continues to report minor pain at end range when reaching overhead. She feels better whenshe does her exercises. she feels pre-pared for D/C.    Limitations House hold activities;Lifting   Diagnostic tests R shoulder MRI: difuse right rotator cuff tendinosis with small interstitial tears. No full thickness tears; Moderate AC joint degeneration    Patient Stated Goals to have less pain   Currently in Pain? No/denies            [facility] PT Assessment - 06/06/17 0001      PROM   Overall PROM Comments full passive ROm without pain      Strength   Overall Strength Comments 5/5 gross shoulder strength                      [facility] Adult PT Treatment/Exercise - 06/06/17 0001      Shoulder Exercises: Supine   Other Supine Exercises wand flexion 2x5 with arms straight and from shoulders , stopped due to catching    Other Supine Exercises reviewed wand ER for HEP      Shoulder Exercises: Sidelying   Other Sidelying Exercises ER 2x10 1 lb      Shoulder Exercises: Standing   Other Standing Exercises shoulder extension 2x10 green ; scpaular retraction 2x10 green; and ER bilaterally with green band     Manual Therapy   Manual therapy comments Grade II and III PA; AP and inferior glides to reduce impingement. STW to pectorals                PT Education - 06/06/17 0751    Education provided Yes   Education Details reviewed entire HEP    Person(s) Educated Patient   Methods Explanation;Demonstration;Tactile cues;Verbal cues   Comprehension Verbalized understanding;Verbal cues required;Returned demonstration;Tactile cues required          PT Short Term Goals - 06/06/17 0754      PT SHORT TERM GOAL #1   Title Patient will demsotrate full pain free ight shoulder flexion    Baseline minor pain at end range at times    Status Partially Met  PT SHORT TERM GOAL #2   Title Patient will demsotrate 5/5 gross right shoulder strength    Baseline 5/5    Time 4   Period Weeks   Status Achieved     PT SHORT TERM GOAL #3   Title Patient will be independent with basic HEP    Time 4   Period Weeks   Status Achieved           PT Long Term Goals - 06/06/17 0754      PT LONG TERM GOAL #1   Title Patient will reach into cabinet over head and grab a 2lb item without pain    Baseline can put 2lb item in and out of a cabinet    Time 8   Period Weeks   Status On-going     PT LONG TERM GOAL #2   Title Patient will vaccum at home without pain    Baseline perfroming without pain    Time 8   Period Weeks   Status Achieved     PT LONG TERM GOAL #3   Title Patient will demsotrate a 36 % limitation on FOTO    Baseline 38% limitation in less visits    Time 8   Period Weeks   Status  Achieved               Plan - 06/06/17 6073    Clinical Impression Statement Reviewed the patients whole HEP. She feels comfortabel with her HEP. she was advised to continue with exercises consitently. She has reached all goals for therapy. D/C to HEP.    Clinical Presentation Stable   Clinical Decision Making Low   Rehab Potential Good   PT Frequency 2x / week   PT Duration 8 weeks   PT Treatment/Interventions ADLs/Self Care Home Management;Cryotherapy;Electrical Stimulation;Iontophoresis 7m/ml Dexamethasone;Gait training;Stair training;Moist Heat;Ultrasound;Therapeutic activities;Therapeutic exercise;Neuromuscular re-education;Patient/family education;Passive range of motion;Manual techniques;Dry needling;Energy conservation;Taping   PT Next Visit Plan add standing ER and IR with t-band; consider supine flexion and d2 flexion; continue with joint mobilization to decrease crepitus and improve range of motion.    PT Home Exercise Plan wand flexion; side lying ER, shoulder extension; scpaular retraction    Consulted and Agree with Plan of Care Patient      Patient will benefit from skilled therapeutic intervention in order to improve the following deficits and impairments:  Decreased strength, Postural dysfunction, Pain, Impaired UE functional use, Decreased range of motion, Decreased activity tolerance, Decreased endurance  Visit Diagnosis: Chronic right shoulder pain  Stiffness of right shoulder, not elsewhere classified  Muscle weakness (generalized)  PHYSICAL THERAPY DISCHARGE SUMMARY  Visits from Start of Care: 6  Current functional level related to goals / functional outcomes: performing most functional tasks     Remaining deficits: Minor pain at end range  ' Education / Equipment: HEP  Plan: Patient agrees to discharge.  Patient goals were not met. Patient is being discharged due to meeting the stated rehab goals.  ?????       Problem List Patient Active  Problem List   Diagnosis Date Noted  . Encounter for Medicare annual wellness exam 03/15/2016  . Hypertension 11/16/2015  . Vitamin D deficiency 11/16/2015  . Medication management 11/16/2015  . Lower back pain 01/10/2015  . Prediabetes 08/22/2014  . Mixed hyperlipidemia 10/05/2013  . Rosacea   . Anxiety   . 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[place]   . Ischemic colitis ([place]     DCarney LivingPT DPT  06/06/2017, 7:58 AM  [place], [place], [address] Phone: [phone]   Fax:  [phone]  Name: Kathryn Lamb MRN: [id number] Date of Birth: 10-20-1950

## 2017-07-31 ENCOUNTER — Encounter: Payer: Self-pay | Admitting: Physician Assistant

## 2017-08-29 NOTE — Progress Notes (Signed)
CPE AND FOLLOW UP  Assessment:   Mixed hyperlipidemia -continue medications, check lipids, decrease fatty foods, increase activity.  - Lipid panel   Prediabetes Discussed general issues about diabetes pathophysiology and management., Educational material distributed., Suggested low cholesterol diet., Encouraged aerobic exercise., Discussed foot care., Reminded to get yearly retinal exam.  Depression, remission Depression- continue medications, stress management techniques discussed, increase water, good sleep hygiene discussed, increase exercise, and increase veggies.   Anxiety controlled   Rosacea Follows with Derm  Ischemic colitis Controlled, avoid triggers, continue follow up GI  Vitamin D deficiency Continue supplement  Medication management - Magnesium  Bilateral low back pain with sciatica, sciatica laterality unspecified Controlled, continue gabapentin PRN  Elevated blood pressure reading without diagnosis of hypertension - CBC with Differential/Platelet - BASIC METABOLIC PANEL WITH GFR - Hepatic function panel - TSH  Over 40 minutes of exam, counseling, chart review, and critical decision making was performed Future Appointments  Date Time Provider [place]  09/09/2018  2:00 PM Vicie Mutters, PA-C [id number] None      Subjective:   Kathryn Lamb is a 67 y.o. female who presents for CPE and 3 month follow up on hypertension, prediabetes, hyperlipidemia, vitamin D def.   Her blood pressure has been controlled at home, today their BP is BP: 126/72 She does workout. She denies chest pain, shortness of breath, dizziness.  She is on cholesterol medication, she is on lipitor 40mg  daily, and denies myalgias. Her cholesterol is at goal. The cholesterol last visit was:   Lab Results  Component Value Date   CHOL 190 05/30/2017   HDL 53 05/30/2017   LDLCALC 147 (H) 02/18/2017   TRIG 171 (H) 05/30/2017   CHOLHDL 3.6 05/30/2017   She has been working  on diet and exercise for prediabetes, and denies paresthesia of the feet, polydipsia, polyuria and visual disturbances. Last A1C in the office was:  Lab Results  Component Value Date   HGBA1C 5.5 05/30/2017   Patient is on Vitamin D supplement. Lab Results  Component Value Date   VD25OH 67 02/18/2017     She is on wellbutrin XL 300mg  for depression and anxiety which helps, she states that her memory is unchanged.  She is on zyrtec for allergies.  She has lower back pain with occ pain in her legs and is on gabapentin for this which helps.  BMI is Body mass index is 25.93 kg/m., she is working on diet and exercise. Wt Readings from Last 3 Encounters:  09/02/17 146 lb 6.4 oz (66.4 kg)  05/30/17 144 lb (65.3 kg)  03/13/17 145 lb (65.8 kg)    Medication Review Current Outpatient Medications on File Prior to Visit  Medication Sig Dispense Refill  . atorvastatin (LIPITOR) 40 MG tablet Take 1 tablet (40 mg total) by mouth daily. 90 tablet 0  . buPROPion (WELLBUTRIN XL) 300 MG 24 hr tablet TAKE 1 TABLET BY MOUTH  DAILY 90 tablet 1  . cetirizine (ZYRTEC) 5 MG tablet Take 5 mg by mouth daily.    . Cholecalciferol (VITAMIN D) 2000 UNITS tablet Take 2,000 Units by mouth daily.    . diclofenac sodium (VOLTAREN) 1 % GEL Apply 2 g topically 4 (four) times daily. 3 Tube 1  . Flaxseed, Linseed, (FLAX SEED OIL) 1000 MG CAPS Take 2,600 mg by mouth daily.     Marland Kitchen gabapentin (NEURONTIN) 100 MG capsule TAKE 2 CAPSULES BY MOUTH 3  TIMES DAILY 540 capsule 1  . Multiple Vitamin (MULTIVITAMIN WITH  MINERALS) TABS tablet Take 1 tablet by mouth. Takes 3 times per week. M,W,F    . Omega-3 Fatty Acids (FISH OIL) 1000 MG CAPS Take 3,000 mg by mouth daily.    . Probiotic Product (PROBIOTIC DAILY PO) Take by mouth daily.     No current facility-administered medications on file prior to visit.     Current Problems (verified) Patient Active Problem List   Diagnosis Date Noted  . Encounter for Medicare annual  wellness exam 03/15/2016  . Hypertension 11/16/2015  . Vitamin D deficiency 11/16/2015  . Medication management 11/16/2015  . Lower back pain 01/10/2015  . Prediabetes 08/22/2014  . Mixed hyperlipidemia 10/05/2013  . Rosacea   . Anxiety   . 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 History of ischemic colitis     Screening Tests Immunization History  Administered Date(s) Administered  . Influenza Split 05/17/2013, 05/17/2014  . Influenza, High Dose Seasonal PF 05/09/2015, 06/18/2016, 05/30/2017  . PPD Test 05/04/2014  . Pneumococcal Conjugate-13 05/09/2015  . Pneumococcal Polysaccharide-23 10/04/2013  . Td 08/30/2002  . Tdap 04/07/2012  . Varicella Zoster Immune Globulin 03/15/2016   Preventative care: Last colonoscopy: 2013 going to have in the spring Last mammogram: 07/2016 Last pap smear/pelvic exam: 2014 has had + HPV, will need one more, will get here but declines this visit.  DEXA: 2016 normal T -1.0 CXR 2013 MRI lumbar spine 2007 MRI right shoulder 02/2017  Prior vaccinations: TD or Tdap: 2013  Influenza: 2018 Pneumococcal: 09/2013 Prevnar13: 2016 Shingles/Zostavax: 2017   Names of Other Physician/Practitioners you currently use: 1. [facility]- here for primary care 2. Dr. Joya San, eye doctor, last visit 2017, glasses 3. Dr. Eula Listen, dentist, last visit May 2018 Patient Care Team: Unk Pinto, MD as PCP - General (Internal Medicine) Richmond Campbell, MD as Consulting Physician (Gastroenterology) Newman Pies, MD as Consulting Physician (Neurosurgery) Allyn Kenner, MD as Consulting Physician (Dermatology)  Allergies Allergies  Allergen Reactions  . Doxycycline   . Erythromycin   . Flagyl [Metronidazole]   . Penicillins   . Sulfa Antibiotics     SURGICAL HISTORY She  has a past surgical history that includes Abdominal hysterectomy; Tubal ligation; Lumbar laminectomy (2000); Carpal tunnel release  (Right, 2004); Appendectomy; and Microdiscectomy lumbar. FAMILY HISTORY Her family history includes Alcohol abuse in her father; Cancer in her father; Diabetes in her father and sister; Heart disease in her mother; Hypertension in her mother; Stroke in her mother. SOCIAL HISTORY She  reports that she quit smoking about 8 years ago. Her smoking use included cigarettes. She smoked 1.00 pack per day. she has never used smokeless tobacco. She reports that she does not drink alcohol or use drugs.  Review of Systems  Constitutional: Negative.   HENT: Negative.   Eyes: Negative.   Respiratory: Negative.   Cardiovascular: Negative.   Gastrointestinal: Negative.   Genitourinary: Negative.   Musculoskeletal: Negative.   Skin: Negative.   Neurological: Negative.   Endo/Heme/Allergies: Negative.   Psychiatric/Behavioral: Negative.      Objective:   Today's Vitals   09/02/17 1359  BP: 126/72  Pulse: 68  Resp: 16  Temp: 97.6 F (36.4 C)  SpO2: 97%  Weight: 146 lb 6.4 oz (66.4 kg)  Height: 5\' 3"  (1.6 m)    General appearance: alert, no distress, WD/WN,  female HEENT: normocephalic, sclerae anicteric, TMs pearly, nares patent, no discharge or erythema, pharynx normal Oral cavity: MMM, no lesions Neck: supple, no lymphadenopathy, no  thyromegaly, no masses Heart: RRR, normal S1, S2, no murmurs Lungs: CTA bilaterally, no wheezes, rhonchi, or rales Abdomen: +bs, soft, non tender, non distended, no masses, no hepatomegaly, no splenomegaly Musculoskeletal: nontender, no swelling, no obvious deformity Extremities: no edema, no cyanosis, no clubbing Pulses: 2+ symmetric, upper and lower extremities, normal cap refill Neurological: alert, oriented x 3, CN2-12 intact, strength normal upper extremities and lower extremities, sensation normal throughout, DTRs 2+ throughout, no cerebellar signs, gait normal Psychiatric: normal affect, behavior normal, pleasant  Breast: defer Gyn: defer Rectal:  defer  EKG: WNL, no ST changes  Vicie Mutters, PA-C   09/02/2017

## 2017-09-02 ENCOUNTER — Encounter: Payer: Self-pay | Admitting: Physician Assistant

## 2017-09-02 ENCOUNTER — Ambulatory Visit (INDEPENDENT_AMBULATORY_CARE_PROVIDER_SITE_OTHER): Payer: Medicare Other | Admitting: Physician Assistant

## 2017-09-02 VITALS — BP 126/72 | HR 68 | Temp 97.6°F | Resp 16 | Ht 63.0 in | Wt 146.4 lb

## 2017-09-02 DIAGNOSIS — Z79899 Other long term (current) drug therapy: Secondary | ICD-10-CM

## 2017-09-02 DIAGNOSIS — Z0000 Encounter for general adult medical examination without abnormal findings: Secondary | ICD-10-CM | POA: Diagnosis not present

## 2017-09-02 DIAGNOSIS — Z6825 Body mass index (BMI) 25.0-25.9, adult: Secondary | ICD-10-CM

## 2017-09-02 DIAGNOSIS — E782 Mixed hyperlipidemia: Secondary | ICD-10-CM | POA: Diagnosis not present

## 2017-09-02 DIAGNOSIS — I10 Essential (primary) hypertension: Secondary | ICD-10-CM | POA: Diagnosis not present

## 2017-09-02 DIAGNOSIS — F419 Anxiety disorder, unspecified: Secondary | ICD-10-CM

## 2017-09-02 DIAGNOSIS — E559 Vitamin D deficiency, unspecified: Secondary | ICD-10-CM

## 2017-09-02 DIAGNOSIS — M5440 Lumbago with sciatica, unspecified side: Secondary | ICD-10-CM

## 2017-09-02 DIAGNOSIS — R7303 Prediabetes: Secondary | ICD-10-CM

## 2017-09-02 DIAGNOSIS — F3342 Major depressive disorder, recurrent, in full remission: Secondary | ICD-10-CM

## 2017-09-02 DIAGNOSIS — Z136 Encounter for screening for cardiovascular disorders: Secondary | ICD-10-CM

## 2017-09-02 DIAGNOSIS — Z8719 Personal history of other diseases of the digestive system: Secondary | ICD-10-CM

## 2017-09-02 DIAGNOSIS — L719 Rosacea, unspecified: Secondary | ICD-10-CM

## 2017-09-02 DIAGNOSIS — Z0001 Encounter for general adult medical examination with abnormal findings: Secondary | ICD-10-CM

## 2017-09-02 NOTE — Patient Instructions (Addendum)
Cologuard is an easy to use noninvasive colon cancer screening test based on the latest advances in stool DNA science.   Colon cancer is 3rd most diagnosed cancer and 2nd leading cause of death in both men and women 68 years of age and older despite being one of the most preventable and treatable cancers if found early.  4 of out 5 people diagnosed with colon cancer have NO prior family history.  When caught EARLY 90% of colon cancer is curable.   You have agreed to do a Cologuard screening and have declined a colonoscopy in spite of being explained the risks and benefits of the colonoscopy in detail, including cancer and death. Please understand that this is test not as sensitive or specific as a colonoscopy and you are still recommended to get a colonoscopy.   If you are NOT medicare please call your insurance company and give them these items to see if they will cover it: 1) CPT code, [phone] 2) Provider is Probation officer 3) [employer] NPI [phone] 4) [place] Tax ID [phone]  Out-of-pocket cost for Cologuard can range from $0 - $649 so please call  You will receive a short call from [place] support center at [employer], when you receive a call they will say they are from [place],  to confirm your mailing address and give you more information.  When they calll you, it will appear on the caller ID as "[employer]" or in some cases only this number will appear, [phone].   [employer] will ship your collection kit directly to you. You will collect a single stool sample in the privacy of your own home, no special preparation required. You will return the kit via [place] pre-paid shipping or pick-up, in the same box it arrived in. Then I will contact you to discuss your results after I receive them from the laboratory.   If you have any questions or concerns, Cologuard Customer Support Specialist are available 24 hours a  day, 7 days a week at [phone] or go to [email or web address].     8 Critical Weight-Loss Tips That Aren't Diet and Exercise  1. STARVE THE DISTRACTIONS  All too often when we eat, we're also multitasking: watching TV, answering emails, scrolling through social media. These habits are detrimental to having a strong, clear, healthy relationship with food, and they can hinder our ability to make dietary changes.  In order to truly focus on what you're eating, how much you're eating, why you're eating those specific foods and, most importantly, how those foods make you feel, you need to starve the distractions. That means when you eat, just eat. Focus on your food, the process it went through to end up on your plate, where it came from and how it nourishes you. With this technique, you're more likely to finish a meal feeling satiated.  2.  CONSIDER WHAT YOU'RE NOT WILLING TO DO  This might sound counterintuitive, but it can help provide a "why" when motivation is waning. Declare, in writing, what you are unwilling to do, for example "I am unwilling to be the old dad who cannot play sports with my children".  So consider what you're not willing to accept, write it down, and keep it at the ready.  3.  STOP LABELING FOOD "GOOD" AND "BAD"  You've probably heard someone say they ate something "bad." Maybe you've even said it yourself.  The trouble with 'bad' foods isn't that they'll  send you to the grave after a bite or two. The trouble comes when we eat excessive portions of really calorie-dense foods meal after meal, day after day.  Instead of labeling foods as good or bad, think about which foods you can eat a lot of, and which ones you should just eat a little of. Then, plan ways to eat the foods you really like in portions that fit with your overall goals. A good example of this would be having a slice of pizza alongside a club salad with chicken breast, avocado and a bit of dressing. This  is vastly different than 3 slices of pizza, 4 breadsticks with cheese sauce and half of a liter of regular soda.  4.  BRUSH YOUR TEETH AFTER YOU EAT  Getting your mindset in order is important, but sometimes small habits can make a big difference. After eating, you still have the taste of food in their mouth, which often causes people to eat more even if they are full or engage in a nibble or two of dessert.  Brushing your teeth will remove the taste of food from your mouth, and the clean, minty freshness will serve as a cue that mealtime is over.  5.  FOCUS ON CROWDING NOT CUTTING  The most common first step during 'dieting' is to cut. We cut our portion sizes down, we cut out 'bad' foods, we cut out entire food groups. This act of cutting puts [place] and our minds into scarcity mode.  When something is off-limits, even if you're able to avoid it for a while, you could end up bingeing on it later because you've gone so long without it. So, instead of cutting, focus on crowding. If you crowd your plate and fill it up with more foods like veggies and protein, it simply allows less room for the other stuff. In other words, shift your focus away from what you can't eat, and celebrate the foods that will help you reach your goals.  6.  TAKE TRACKING A STEP FURTHER  Track what you eat, when you ate it, how much you ate and how that food made you feel. Being completely honest with yourself and writing down every single thing that passes through your lips will help you start to notice that maybe you actually do snack, possibly take in more sugar than you thought, eat when you're bored rather than just hungry or maybe that you have a habit of snacking before bed while watching TV.  The difference from simply tracking your food intake is you're taking into account how food makes you feel, as well as what you're doing while you're eating. This is about becoming more mindful of what, when and why you eat.  7.   PRIORITIZE GOOD SLEEP  One of the strongest risk factors for being overweight is poor sleep. When you're feeling tired, you're more likely to choose unhealthy comfort foods and to skip your workout. Additionally, sleep deprivation may slow down your metabolism. [employer]! Therefore, sleeping 7-8 hours per night can help with weight loss without having to change your diet or increase your physical activity. And if you feel you snore and still wake up tired, talk with me about sleep apnea.  8.  SET ASIDE TIME TO DISCONNECT  Just get out there. Disconnect from the electronics and connect to the elements. Not only will this help reduce stress (a major factor in weight gain) by giving your mind a break from the constant stimulation we've  all become so accustomed to, but it may also reprogram your brain to connect with yourself and what you're feeling.  Intermittent fasting is more about strategy than starvation. It's meant to reset your body in different ways, hopefully with fitness and nutrition changes as a result.  Like any big switchover, though, results may vary when it comes down to the individual level. What works for your friends may not work for you, or vice versa. That's why it's helpful to play around with variations on intermittent fasting and healthy habits and find what works best for you.  WHAT IS INTERMITTENT FASTING AND WHY DO IT?  Intermittent fasting doesn't involve specific foods, but rather, a strict schedule regarding when you eat. Also called "time-restricted eating," the tactic has been praised for its contribution to weight loss, improved body composition, and decreased cravings. Preliminary research also suggests it may be beneficial for glucose tolerance, hormone regulation, better muscle mass and lower body fat.  Part of its appeal is the simplicity of the effort. Unlike some other trends, there's no calculations to intermittent fasting.  You simply eat within a certain block of  time, usually a window of 8-10 hours. In the other big block of time - about 14-16 hours, including when you're asleep - you don't eat anything, not even snacks. You can drink water, coffee, tea or any other beverage that doesn't have calories.  For example, if you like having a late dinner, you might skip breakfast and have your first meal at noon and your last meal of the day at 8 p.m., and then not eat until noon again the next day.  IDEAS FOR GETTING STARTED  If you're new to the strategy, it may be helpful to eat within the typical circadian rhythm and keep eating within daylight hours. This can be especially beneficial if you're looking at intermittent fasting for weight-loss goals.  So first try only eating between 12pm to 8pm.  Outside of this time you may have water, black coffee, and hot tea. You may not eat it drink anything that has carbs, sugars, OR artificial sugars like diet soda.   Like any major eating and fitness shift, it can take time to find the perfect fit, so don't be afraid to experiment with different options - including ditching intermittent fasting altogether if it's simply not for you. But if it is, you may be surprised by some of the benefits that come along with the strategy.

## 2017-09-03 ENCOUNTER — Other Ambulatory Visit: Payer: Self-pay | Admitting: Internal Medicine

## 2017-09-03 ENCOUNTER — Encounter: Payer: Self-pay | Admitting: Physician Assistant

## 2017-09-03 DIAGNOSIS — Z1231 Encounter for screening mammogram for malignant neoplasm of breast: Secondary | ICD-10-CM

## 2017-09-03 LAB — BASIC METABOLIC PANEL WITH GFR
BUN: 14 mg/dL (ref 7–25)
CHLORIDE: 106 mmol/L (ref 98–110)
CO2: 29 mmol/L (ref 20–32)
Calcium: 9.9 mg/dL (ref 8.6–10.4)
Creat: 0.85 mg/dL (ref 0.50–0.99)
GFR, Est African American: 82 mL/min/{1.73_m2} (ref >=60)
GFR, Est Non African American: 71 mL/min/{1.73_m2} (ref >=60)
Glucose, Bld: 94 mg/dL (ref 65–99)
Potassium: 4.5 mmol/L (ref 3.5–5.3)
Sodium: 143 mmol/L (ref 135–146)

## 2017-09-03 LAB — CBC WITH DIFFERENTIAL/PLATELET
Basophils Absolute: 32 cells/uL (ref 0–200)
Basophils Relative: 0.6 %
EOS PCT: 3 %
Eosinophils Absolute: 162 cells/uL (ref 15–500)
HEMATOCRIT: 35.8 % (ref 35.0–45.0)
Hemoglobin: 12.2 g/dL (ref 11.7–15.5)
LYMPHS ABS: 2668 {cells}/uL (ref 850–3900)
MCH: 29.9 pg (ref 27.0–33.0)
MCHC: 34.1 g/dL (ref 32.0–36.0)
MCV: 87.7 fL (ref 80.0–100.0)
MPV: 9.7 fL (ref 7.5–12.5)
Monocytes Relative: 6.5 %
NEUTROS PCT: 40.5 %
Neutro Abs: 2187 cells/uL (ref 1500–7800)
PLATELETS: 264 10*3/uL (ref 140–400)
RBC: 4.08 10*6/uL (ref 3.80–5.10)
RDW: 12.3 % (ref 11.0–15.0)
TOTAL LYMPHOCYTE: 49.4 %
WBC mixed population: 351 cells/uL (ref 200–950)
WBC: 5.4 10*3/uL (ref 3.8–10.8)

## 2017-09-03 LAB — URINALYSIS, ROUTINE W REFLEX MICROSCOPIC
Bilirubin Urine: NEGATIVE
Glucose, UA: NEGATIVE
HGB URINE DIPSTICK: NEGATIVE
KETONES UR: NEGATIVE
Leukocytes, UA: NEGATIVE
NITRITE: NEGATIVE
PH: 6 (ref 5.0–8.0)
Protein, ur: NEGATIVE
Specific Gravity, Urine: 1.012 (ref 1.001–1.03)

## 2017-09-03 LAB — HEPATIC FUNCTION PANEL
AG RATIO: 1.9 (calc) (ref 1.0–2.5)
ALBUMIN MSPROF: 4.6 g/dL (ref 3.6–5.1)
ALT: 20 U/L (ref 6–29)
AST: 16 U/L (ref 10–35)
Alkaline phosphatase (APISO): 58 U/L (ref 33–130)
BILIRUBIN DIRECT: 0.1 mg/dL (ref 0.0–0.2)
BILIRUBIN INDIRECT: 0.5 mg/dL (ref 0.2–1.2)
GLOBULIN: 2.4 g/dL (ref 1.9–3.7)
Total Bilirubin: 0.6 mg/dL (ref 0.2–1.2)
Total Protein: 7 g/dL (ref 6.1–8.1)

## 2017-09-03 LAB — TSH: TSH: 2.55 mIU/L (ref 0.40–4.50)

## 2017-09-03 LAB — MICROALBUMIN / CREATININE URINE RATIO
Creatinine, Urine: 51 mg/dL (ref 20–275)
MICROALB UR: 0.3 mg/dL
Microalb Creat Ratio: 6 mcg/mg creat (ref <30)

## 2017-09-03 LAB — LIPID PANEL
CHOL/HDL RATIO: 4.2 (calc) (ref <5.0)
Cholesterol: 197 mg/dL (ref <200)
HDL: 47 mg/dL — ABNORMAL LOW (ref >50)
LDL CHOLESTEROL (CALC): 118 mg/dL — AB
NON-HDL CHOLESTEROL (CALC): 150 mg/dL — AB (ref <130)
TRIGLYCERIDES: 202 mg/dL — AB (ref <150)

## 2017-09-03 LAB — MAGNESIUM: MAGNESIUM: 2 mg/dL (ref 1.5–2.5)

## 2017-09-03 LAB — VITAMIN D 25 HYDROXY (VIT D DEFICIENCY, FRACTURES): VIT D 25 HYDROXY: 43 ng/mL (ref 30–100)

## 2017-09-24 ENCOUNTER — Ambulatory Visit
Admission: RE | Admit: 2017-09-24 | Discharge: 2017-09-24 | Disposition: A | Discharge status: Home / Self Care | Payer: Medicare Other | Source: Ambulatory Visit | Attending: Internal Medicine | Admitting: Internal Medicine

## 2017-09-24 DIAGNOSIS — Z1231 Encounter for screening mammogram for malignant neoplasm of breast: Secondary | ICD-10-CM | POA: Diagnosis not present

## 2017-09-26 ENCOUNTER — Other Ambulatory Visit: Payer: Self-pay | Admitting: Internal Medicine

## 2017-12-15 DIAGNOSIS — K5730 Diverticulosis of large intestine without perforation or abscess without bleeding: Secondary | ICD-10-CM | POA: Diagnosis not present

## 2017-12-15 DIAGNOSIS — Z1211 Encounter for screening for malignant neoplasm of colon: Secondary | ICD-10-CM | POA: Diagnosis not present

## 2017-12-15 LAB — HM COLONOSCOPY

## 2017-12-23 ENCOUNTER — Encounter: Payer: Self-pay | Admitting: Internal Medicine

## 2018-03-02 ENCOUNTER — Ambulatory Visit: Payer: Self-pay | Admitting: Internal Medicine

## 2018-03-09 ENCOUNTER — Ambulatory Visit (INDEPENDENT_AMBULATORY_CARE_PROVIDER_SITE_OTHER): Payer: Medicare Other | Admitting: Internal Medicine

## 2018-03-09 ENCOUNTER — Encounter: Payer: Self-pay | Admitting: Internal Medicine

## 2018-03-09 VITALS — BP 126/78 | HR 80 | Temp 97.3°F | Resp 16 | Ht 64.0 in | Wt 146.8 lb

## 2018-03-09 DIAGNOSIS — R7303 Prediabetes: Secondary | ICD-10-CM | POA: Diagnosis not present

## 2018-03-09 DIAGNOSIS — E559 Vitamin D deficiency, unspecified: Secondary | ICD-10-CM | POA: Diagnosis not present

## 2018-03-09 DIAGNOSIS — Z79899 Other long term (current) drug therapy: Secondary | ICD-10-CM

## 2018-03-09 DIAGNOSIS — E782 Mixed hyperlipidemia: Secondary | ICD-10-CM

## 2018-03-09 DIAGNOSIS — K589 Irritable bowel syndrome without diarrhea: Secondary | ICD-10-CM

## 2018-03-09 DIAGNOSIS — R030 Elevated blood-pressure reading, without diagnosis of hypertension: Secondary | ICD-10-CM

## 2018-03-09 DIAGNOSIS — R7309 Other abnormal glucose: Secondary | ICD-10-CM | POA: Diagnosis not present

## 2018-03-09 MED ORDER — DICYCLOMINE HCL 20 MG PO TABS: ORAL_TABLET | ORAL | 0 refills | Status: DC

## 2018-03-09 NOTE — Patient Instructions (Signed)

## 2018-03-09 NOTE — Progress Notes (Signed)
This very nice 67 y.o. MWF presents for 3 month follow up with HTN, HLD, Pre-Diabetes and Vitamin D Deficiency. She had recent Colonoscopy 12/15/2017 by Dr Earlean Shawl finding moderate to severe Diverticulosis of the Descending Colon. She does endorse intermittent LLQ cramping & bloating and does have hx/o IBS.  She apparently was recommended high fiber diet and given Rx Hyoscyamine which she never filled .     Patient is followed expectantly for labile HTN & BP has been controlled and today's BP is at goal - 126/78. Patient has had no complaints of any cardiac type chest pain, palpitations, dyspnea / orthopnea / PND, dizziness, claudication, or dependent edema.     Hyperlipidemia is controlled with diet & meds. Patient denies myalgias or other med SE's. Last Lipids were not at goal:   Lab Results  Component Value Date   CHOL 197 09/02/2017   HDL 47 (L) 09/02/2017   LDLCALC 118 (H) 09/02/2017   TRIG 202 (H) 09/02/2017   CHOLHDL 4.2 09/02/2017      Also, the patient has history of PreDiabetes (A1c 5.7%/Sept 2016) and has had no symptoms of reactive hypoglycemia, diabetic polys, paresthesias or visual blurring.  Last A1c was Normal & at goal: Lab Results  Component Value Date   HGBA1C 5.5 05/30/2017      Further, the patient also has history of Vitamin D Deficiency and supplements vitamin D without any suspected side-effects. Last vitamin D was still low (goal 70-100):  Lab Results  Component Value Date   VD25OH 43 09/02/2017   Current Outpatient Medications on File Prior to Visit  Medication Sig  . atorvastatin (LIPITOR) 80 MG tablet TAKE 1 TABLET BY MOUTH  DAILY  . buPROPion (WELLBUTRIN XL) 300 MG 24 hr tablet TAKE 1 TABLET BY MOUTH  DAILY  . cetirizine (ZYRTEC) 5 MG tablet Take 5 mg by mouth daily.  . Cholecalciferol (VITAMIN D) 2000 UNITS tablet Take 2,000 Units by mouth daily.  Marland Kitchen gabapentin (NEURONTIN) 100 MG capsule TAKE 2 CAPSULES BY MOUTH 3  TIMES DAILY  . Multiple Vitamin  (MULTIVITAMIN WITH MINERALS) TABS tablet Take 1 tablet by mouth. Takes 3 times per week. M,W,F  . Probiotic Product (PROBIOTIC DAILY PO) Take by mouth daily.  . Flaxseed, Linseed, (FLAX SEED OIL) 1000 MG CAPS Take 2,600 mg by mouth daily.   . Omega-3 Fatty Acids (FISH OIL) 1000 MG CAPS Take 3,000 mg by mouth daily.   No current facility-administered medications on file prior to visit.    Allergies  Allergen Reactions  . Doxycycline   . Erythromycin   . Flagyl [Metronidazole]   . Penicillins   . Sulfa Antibiotics    PMHx:   Past Medical History:  Diagnosis Date  . Anxiety   . Depression   . Diverticulitis   . Ischemic colitis ([place])   . Mixed hyperlipidemia 10/05/2013  . Rosacea    Immunization History  Administered Date(s) Administered  . Influenza Split 05/17/2013, 05/17/2014  . Influenza, High Dose Seasonal PF 05/09/2015, 06/18/2016, 05/30/2017  . PPD Test 05/04/2014  . Pneumococcal Conjugate-13 05/09/2015  . Pneumococcal Polysaccharide-23 10/04/2013  . Td 08/30/2002  . Tdap 04/07/2012  . Varicella Zoster Immune Globulin 03/15/2016   Past Surgical History:  Procedure Laterality Date  . ABDOMINAL HYSTERECTOMY    . APPENDECTOMY    . CARPAL TUNNEL RELEASE Right 2004  . LUMBAR LAMINECTOMY  2000  . MICRODISCECTOMY LUMBAR    . TUBAL LIGATION     FHx:  Reviewed / unchanged  SHx:    Reviewed / unchanged   Systems Review:  Constitutional: Denies fever, chills, wt changes, headaches, insomnia, fatigue, night sweats, change in appetite. Eyes: Denies redness, blurred vision, diplopia, discharge, itchy, watery eyes.  ENT: Denies discharge, congestion, post nasal drip, epistaxis, sore throat, earache, hearing loss, dental pain, tinnitus, vertigo, sinus pain, snoring.  CV: Denies chest pain, palpitations, irregular heartbeat, syncope, dyspnea, diaphoresis, orthopnea, PND, claudication or edema. Respiratory: denies cough, dyspnea, DOE, pleurisy, hoarseness, laryngitis,  wheezing.  Gastrointestinal: Denies dysphagia, odynophagia, heartburn, reflux, water brash, abdominal pain or cramps, nausea, vomiting, bloating, diarrhea, constipation, hematemesis, melena, hematochezia  or hemorrhoids. Genitourinary: Denies dysuria, frequency, urgency, nocturia, hesitancy, discharge, hematuria or flank pain. Musculoskeletal: Denies arthralgias, myalgias, stiffness, jt. swelling, pain, limping or strain/sprain.  Skin: Denies pruritus, rash, hives, warts, acne, eczema or change in skin lesion(s). Neuro: No weakness, tremor, incoordination, spasms, paresthesia or pain. Psychiatric: Denies confusion, memory loss or sensory loss. Endo: Denies change in weight, skin or hair change.  Heme/Lymph: No excessive bleeding, bruising or enlarged lymph nodes.  Physical Exam  BP 126/78   Pulse 80   Temp (!) 97.3 F (36.3 C)   Resp 16   Ht 5\' 4"  (1.626 m)   Wt 146 lb 12.8 oz (66.6 kg)   BMI 25.20 kg/m   Appears  well nourished, well groomed  and in no distress.  Eyes: PERRLA, EOMs, conjunctiva no swelling or erythema. Sinuses: No frontal/maxillary tenderness ENT/Mouth: EAC's clear, TM's nl w/o erythema, bulging. Nares clear w/o erythema, swelling, exudates. Oropharynx clear without erythema or exudates. Oral hygiene is good. Tongue normal, non obstructing. Hearing intact.  Neck: Supple. Thyroid not palpable. Car 2+/2+ without bruits, nodes or JVD. Chest: Respirations nl with BS clear & equal w/o rales, rhonchi, wheezing or stridor.  Cor: Heart sounds normal w/ regular rate and rhythm without sig. murmurs, gallops, clicks or rubs. Peripheral pulses normal and equal  without edema.  Abdomen: Soft & bowel sounds normal. Non-tender w/o guarding, rebound, hernias, masses or organomegaly.  Lymphatics: Unremarkable.  Musculoskeletal: Full ROM all peripheral extremities, joint stability, 5/5 strength and normal gait.  Skin: Warm, dry without exposed rashes, lesions or ecchymosis apparent.    Neuro: Cranial nerves intact, reflexes equal bilaterally. Sensory-motor testing grossly intact. Tendon reflexes grossly intact.  Pysch: Alert & oriented x 3.  Insight and judgement nl & appropriate. No ideations.  Assessment and Plan:  1. Elevated BP without diagnosis of hypertension  - Continue medication, monitor blood pressure at home.  - Continue DASH diet.  Reminder to go to the ER if any CP,  SOB, nausea, dizziness, severe HA, changes vision/speech.  - CBC with Differential/Platelet - COMPLETE METABOLIC PANEL WITH GFR - Magnesium - TSH  2. Hyperlipidemia, mixed  - Continue diet/meds, exercise,& lifestyle modifications.  - Continue monitor periodic cholesterol/liver & renal functions   - Lipid panel - TSH  3. Abnormal glucose  - Continue diet, exercise, lifestyle modifications.  - Monitor appropriate labs.  - Hemoglobin A1c - Insulin, random  4. Vitamin D deficiency  - Continue supplementation.  - VITAMIN D 25 Hydroxyl  5. Prediabetes  - Hemoglobin A1c - Insulin, random  6. Irritable bowel syndrome  - discussed high fiber diet - dicyclomine (BENTYL) 20 MG tablet; Take 1 tablet 2 to 3 x /day as needed for abdominal cramping or bloating  Dispense: 90 tablet; Refill: 0  7. Medication management  - CBC with Differential/Platelet - COMPLETE METABOLIC PANEL WITH GFR -  Magnesium - Lipid panel - TSH - Hemoglobin A1c - Insulin, random - VITAMIN D 25 Hydroxyl       Discussed  regular exercise, BP monitoring, weight control to achieve/maintain BMI less than 25 and discussed med and SE's. Recommended labs to assess and monitor clinical status with further disposition pending results of labs. Over 30 minutes of exam, counseling, chart review was performed.

## 2018-03-10 LAB — CBC WITH DIFFERENTIAL/PLATELET
BASOS ABS: 28 {cells}/uL (ref 0–200)
Basophils Relative: 0.5 %
EOS PCT: 3.8 %
Eosinophils Absolute: 213 cells/uL (ref 15–500)
HEMATOCRIT: 38.3 % (ref 35.0–45.0)
Hemoglobin: 13.1 g/dL (ref 11.7–15.5)
Lymphs Abs: 2598 cells/uL (ref 850–3900)
MCH: 30 pg (ref 27.0–33.0)
MCHC: 34.2 g/dL (ref 32.0–36.0)
MCV: 87.8 fL (ref 80.0–100.0)
MONOS PCT: 5.7 %
MPV: 9.5 fL (ref 7.5–12.5)
NEUTROS PCT: 43.6 %
Neutro Abs: 2442 cells/uL (ref 1500–7800)
PLATELETS: 293 10*3/uL (ref 140–400)
RBC: 4.36 10*6/uL (ref 3.80–5.10)
RDW: 12.6 % (ref 11.0–15.0)
TOTAL LYMPHOCYTE: 46.4 %
WBC mixed population: 319 cells/uL (ref 200–950)
WBC: 5.6 10*3/uL (ref 3.8–10.8)

## 2018-03-10 LAB — COMPLETE METABOLIC PANEL WITH GFR
AG RATIO: 2 (calc) (ref 1.0–2.5)
ALT: 22 U/L (ref 6–29)
AST: 18 U/L (ref 10–35)
Albumin: 4.8 g/dL (ref 3.6–5.1)
Alkaline phosphatase (APISO): 61 U/L (ref 33–130)
BILIRUBIN TOTAL: 0.6 mg/dL (ref 0.2–1.2)
BUN: 12 mg/dL (ref 7–25)
CHLORIDE: 104 mmol/L (ref 98–110)
CO2: 31 mmol/L (ref 20–32)
Calcium: 10.4 mg/dL (ref 8.6–10.4)
Creat: 0.83 mg/dL (ref 0.50–0.99)
GFR, EST AFRICAN AMERICAN: 84 mL/min/{1.73_m2} (ref >=60)
GFR, Est Non African American: 72 mL/min/{1.73_m2} (ref >=60)
GLOBULIN: 2.4 g/dL (ref 1.9–3.7)
Glucose, Bld: 87 mg/dL (ref 65–99)
Potassium: 4.8 mmol/L (ref 3.5–5.3)
SODIUM: 142 mmol/L (ref 135–146)
TOTAL PROTEIN: 7.2 g/dL (ref 6.1–8.1)

## 2018-03-10 LAB — LIPID PANEL
Cholesterol: 238 mg/dL — ABNORMAL HIGH (ref <200)
HDL: 46 mg/dL — AB (ref >50)
LDL Cholesterol (Calc): 145 mg/dL (calc) — ABNORMAL HIGH
NON-HDL CHOLESTEROL (CALC): 192 mg/dL — AB (ref <130)
Total CHOL/HDL Ratio: 5.2 (calc) — ABNORMAL HIGH (ref <5.0)
Triglycerides: 288 mg/dL — ABNORMAL HIGH (ref <150)

## 2018-03-10 LAB — HEMOGLOBIN A1C
Hgb A1c MFr Bld: 6 % of total Hgb — ABNORMAL HIGH (ref <5.7)
MEAN PLASMA GLUCOSE: 126 (calc)
eAG (mmol/L): 7 (calc)

## 2018-03-10 LAB — INSULIN, RANDOM: INSULIN: 13.4 u[IU]/mL (ref 2.0–19.6)

## 2018-03-10 LAB — TSH: TSH: 2.7 mIU/L (ref 0.40–4.50)

## 2018-03-10 LAB — MAGNESIUM: Magnesium: 2.3 mg/dL (ref 1.5–2.5)

## 2018-03-10 LAB — VITAMIN D 25 HYDROXY (VIT D DEFICIENCY, FRACTURES): Vit D, 25-Hydroxy: 41 ng/mL (ref 30–100)

## 2018-03-13 DIAGNOSIS — K5730 Diverticulosis of large intestine without perforation or abscess without bleeding: Secondary | ICD-10-CM | POA: Insufficient documentation

## 2018-03-13 DIAGNOSIS — K5900 Constipation, unspecified: Secondary | ICD-10-CM | POA: Diagnosis not present

## 2018-04-17 ENCOUNTER — Other Ambulatory Visit: Payer: Self-pay | Admitting: Internal Medicine

## 2018-09-07 NOTE — Progress Notes (Signed)
MEDICARE WELLNESS AND FOLLOW UP  Assessment:   Mixed hyperlipidemia -continue medications, check lipids, decrease fatty foods, increase activity.  - Lipid panel   Prediabetes Discussed general issues about diabetes pathophysiology and management., Educational material distributed., Suggested low cholesterol diet., Encouraged aerobic exercise., Discussed foot care., Reminded to get yearly retinal exam.  Depression, remission Depression- continue medications, stress management techniques discussed, increase water, good sleep hygiene discussed, increase exercise, and increase veggies.   Anxiety controlled   Rosacea Follows with Derm  Ischemic colitis Controlled, avoid triggers, continue follow up GI  Vitamin D deficiency Continue supplement  Medication management - Magnesium  Bilateral low back pain with sciatica, sciatica laterality unspecified Controlled, continue gabapentin PRN  Elevated blood pressure reading without diagnosis of hypertension - CBC with Differential/Platelet - BASIC METABOLIC PANEL WITH GFR - Hepatic function panel - TSH  Future Appointments  Date Time Provider [place]  09/15/2019  2:00 PM Vicie Mutters, PA-C [facility] None      Plan:   During the course of the visit the patient was educated and counseled about appropriate screening and preventive services including:    Pneumococcal vaccine   Prevnar 13  Influenza vaccine  Td vaccine  Screening electrocardiogram  Bone densitometry screening  Colorectal cancer screening  Diabetes screening  Glaucoma screening  Nutrition counseling   Advanced directives: requested   Over 30 minutes of exam, counseling, chart review, and critical decision making was performed Future Appointments  Date Time Provider [place]  09/09/2018  2:00 PM Vicie Mutters, PA-C [facility] None  09/15/2019  2:00 PM Vicie Mutters, PA-C [facility] None      Subjective:   Kathryn Lamb is a 68 y.o. female who presents for wellness and 3 month follow up on hypertension, prediabetes, hyperlipidemia, vitamin D def.   She states she has some toe nail fungus and has an issues with the bottom of her feet, asking for podiatry referral.   Her blood pressure has been controlled at home, today their BP is BP: 120/76 She does workout. She denies chest pain, shortness of breath, dizziness.  She is on cholesterol medication, she increased to 80mg  last visit, and denies myalgias. Her cholesterol is at goal. The cholesterol last visit was:   Lab Results  Component Value Date   CHOL 238 (H) 03/09/2018   HDL 46 (L) 03/09/2018   LDLCALC 145 (H) 03/09/2018   TRIG 288 (H) 03/09/2018   CHOLHDL 5.2 (H) 03/09/2018   She has been working on diet and exercise for prediabetes, and denies paresthesia of the feet, polydipsia, polyuria and visual disturbances. Last A1C in the office was:  Lab Results  Component Value Date   HGBA1C 6.0 (H) 03/09/2018   Patient is on Vitamin D supplement. Lab Results  Component Value Date   VD25OH 41 03/09/2018     She is on wellbutrin XL 300mg  for depression and anxiety which helps, she states that her memory is unchanged.  She is on zyrtec for allergies.  She has lower back pain with occ pain in her legs and is on gabapentin for this which helps.  BMI is Body mass index is 25.21 kg/m., she is working on diet and exercise. Wt Readings from Last 3 Encounters:  09/09/18 144 lb 9.6 oz (65.6 kg)  03/09/18 146 lb 12.8 oz (66.6 kg)  09/02/17 146 lb 6.4 oz (66.4 kg)    Medication Review Current Outpatient Medications on File Prior to Visit  Medication Sig Dispense Refill  . atorvastatin (  LIPITOR) 80 MG tablet TAKE 1 TABLET BY MOUTH  DAILY 90 tablet 1  . buPROPion (WELLBUTRIN XL) 300 MG 24 hr tablet TAKE 1 TABLET BY MOUTH  DAILY 90 tablet 1  . cetirizine (ZYRTEC) 5 MG tablet Take 5 mg by mouth daily.    . Cholecalciferol (VITAMIN D) 2000 UNITS tablet  Take 2,000 Units by mouth daily.    . Flaxseed, Linseed, (FLAX SEED OIL) 1000 MG CAPS Take 2,600 mg by mouth daily.     Marland Kitchen gabapentin (NEURONTIN) 100 MG capsule TAKE 2 CAPSULES BY MOUTH 3  TIMES DAILY 540 capsule 1  . Multiple Vitamin (MULTIVITAMIN WITH MINERALS) TABS tablet Take 1 tablet by mouth. Takes 3 times per week. M,W,F    . Omega-3 Fatty Acids (FISH OIL) 1000 MG CAPS Take 3,000 mg by mouth daily.    . Probiotic Product (PROBIOTIC DAILY PO) Take by mouth daily.     No current facility-administered medications on file prior to visit.     Current Problems (verified) Patient Active Problem List   Diagnosis Date Noted  . Encounter for Medicare annual wellness exam 03/15/2016  . Hypertension 11/16/2015  . Vitamin D deficiency 11/16/2015  . Medication management 11/16/2015  . Lower back pain 01/10/2015  . Prediabetes 08/22/2014  . Mixed hyperlipidemia 10/05/2013  . Rosacea   . Anxiety   . 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 History of ischemic colitis     Screening Tests Immunization History  Administered Date(s) Administered  . Influenza Split 05/17/2013, 05/17/2014  . Influenza, High Dose Seasonal PF 05/09/2015, 06/18/2016, 05/30/2017, 06/01/2018  . Influenza-Unspecified 06/03/2018  . PPD Test 05/04/2014  . Pneumococcal Conjugate-13 05/09/2015  . Pneumococcal Polysaccharide-23 10/04/2013  . Td 08/30/2002  . Tdap 04/07/2012  . Varicella Zoster Immune Globulin 03/15/2016   Preventative care: Last colonoscopy: 12/2017- GI states for her to not have another- she needed peds scope and had a very difficult exam- will discuss other options in 10 years.  Last mammogram: 09/2017 Last pap smear/pelvic exam: 2014 has had + HPV, will need one more DEXA: 2016 normal T -1.0 CXR 2013 MRI lumbar spine 2007 MRI right shoulder 02/2017  Prior vaccinations: TD or Tdap: 2013  Influenza: 2018 Pneumococcal: 09/2013 Prevnar13: 2016 Shingles/Zostavax: 2017   Names of Other  Physician/Practitioners you currently use: 1. [facility]- here for primary care 2. Dr. Joya San, eye doctor, last visit 2019, glasses 3. Dr. Leonides Sake, dentist, last visit Jan 2020 Patient Care Team: Unk Pinto, MD as PCP - General (Internal Medicine) Richmond Campbell, MD as Consulting Physician (Gastroenterology) Newman Pies, MD as Consulting Physician (Neurosurgery) Allyn Kenner, MD as Consulting Physician (Dermatology)  Allergies Allergies  Allergen Reactions  . Doxycycline   . Erythromycin   . Flagyl [Metronidazole]   . Penicillins   . Sulfa Antibiotics     SURGICAL HISTORY She  has a past surgical history that includes Abdominal hysterectomy; Tubal ligation; Lumbar laminectomy (2000); Carpal tunnel release (Right, 2004); Appendectomy; and Microdiscectomy lumbar.   FAMILY HISTORY Her family history includes Alcohol abuse in her father; Cancer in her father; Diabetes in her father and sister; Heart disease in her mother; Hypertension in her mother; Stroke in her mother.   SOCIAL HISTORY She  reports that she quit smoking about 9 years ago. Her smoking use included cigarettes. She smoked 1.00 pack per day. She has never used smokeless tobacco. She reports that she does not drink alcohol or use drugs.  MEDICARE WELLNESS  OBJECTIVES: Physical activity:   Cardiac risk factors:   Depression/mood screen:   Depression screen [facility] 2/9 02/17/2017  Decreased Interest 0  Down, Depressed, Hopeless 0  PHQ - 2 Score 0    ADLs:  No flowsheet data found.   Cognitive Testing  Alert? Yes  Normal Appearance?Yes  Oriented to person? Yes  Place? Yes   Time? Yes  Recall of three objects?  Yes  Can perform simple calculations? Yes  Displays appropriate judgment?Yes  Can read the correct time from a watch face?Yes  EOL planning: Does Patient Have a Medical Advance Directive?: No Does patient want to make changes to medical advance directive?: Yes  (MAU/Ambulatory/Procedural Areas - Information given)    Review of Systems  Constitutional: Negative.   HENT: Negative.   Eyes: Negative.   Respiratory: Negative.   Cardiovascular: Negative.   Gastrointestinal: Negative.   Genitourinary: Negative.   Musculoskeletal: Negative.   Skin: Negative.   Neurological: Negative.   Endo/Heme/Allergies: Negative.   Psychiatric/Behavioral: Negative.      Objective:   Today's Vitals   09/09/18 1348  BP: 120/76  Pulse: 86  Temp: (!) 97.1 F (36.2 C)  SpO2: 96%  Weight: 144 lb 9.6 oz (65.6 kg)  Height: 5' 3.5" (1.613 m)  PainSc: 0-No pain    General appearance: alert, no distress, WD/WN,  female HEENT: normocephalic, sclerae anicteric, TMs pearly, nares patent, no discharge or erythema, pharynx normal Oral cavity: MMM, no lesions Neck: supple, no lymphadenopathy, no thyromegaly, no masses Heart: RRR, normal S1, S2, no murmurs Lungs: CTA bilaterally, no wheezes, rhonchi, or rales Abdomen: +bs, soft, non tender, non distended, no masses, no hepatomegaly, no splenomegaly Musculoskeletal: nontender, no swelling, no obvious deformity Extremities: no edema, no cyanosis, no clubbing Pulses: 2+ symmetric, upper and lower extremities, normal cap refill Neurological: alert, oriented x 3, CN2-12 intact, strength normal upper extremities and lower extremities, sensation normal throughout, DTRs 2+ throughout, no cerebellar signs, gait normal Psychiatric: normal affect, behavior normal, pleasant  Breast: defer Gyn: defer Rectal: defer   Medicare Attestation I have personally reviewed: The patient's medical and social history Their use of alcohol, tobacco or illicit drugs Their current medications and supplements The patient's functional ability including ADLs,fall risks, home safety risks, cognitive, and hearing and visual impairment Diet and physical activities Evidence for depression or mood disorders  The patient's weight, height,  BMI, and visual acuity have been recorded in the chart.  I have made referrals, counseling, and provided education to the patient based on review of the above and I have provided the patient with a written personalized care plan for preventive services.     Vicie Mutters, PA-C   09/09/2018

## 2018-09-09 ENCOUNTER — Encounter: Payer: Self-pay | Admitting: Physician Assistant

## 2018-09-09 ENCOUNTER — Ambulatory Visit (INDEPENDENT_AMBULATORY_CARE_PROVIDER_SITE_OTHER): Payer: Medicare Other | Admitting: Physician Assistant

## 2018-09-09 VITALS — BP 120/76 | HR 86 | Temp 97.1°F | Ht 63.5 in | Wt 144.6 lb

## 2018-09-09 DIAGNOSIS — F419 Anxiety disorder, unspecified: Secondary | ICD-10-CM

## 2018-09-09 DIAGNOSIS — Z0001 Encounter for general adult medical examination with abnormal findings: Secondary | ICD-10-CM

## 2018-09-09 DIAGNOSIS — I10 Essential (primary) hypertension: Secondary | ICD-10-CM

## 2018-09-09 DIAGNOSIS — M5440 Lumbago with sciatica, unspecified side: Secondary | ICD-10-CM

## 2018-09-09 DIAGNOSIS — Z0000 Encounter for general adult medical examination without abnormal findings: Secondary | ICD-10-CM

## 2018-09-09 DIAGNOSIS — E559 Vitamin D deficiency, unspecified: Secondary | ICD-10-CM | POA: Diagnosis not present

## 2018-09-09 DIAGNOSIS — M8580 Other specified disorders of bone density and structure, unspecified site: Secondary | ICD-10-CM

## 2018-09-09 DIAGNOSIS — F3342 Major depressive disorder, recurrent, in full remission: Secondary | ICD-10-CM | POA: Diagnosis not present

## 2018-09-09 DIAGNOSIS — E782 Mixed hyperlipidemia: Secondary | ICD-10-CM | POA: Diagnosis not present

## 2018-09-09 DIAGNOSIS — R7303 Prediabetes: Secondary | ICD-10-CM

## 2018-09-09 DIAGNOSIS — Z8719 Personal history of other diseases of the digestive system: Secondary | ICD-10-CM

## 2018-09-09 DIAGNOSIS — L719 Rosacea, unspecified: Secondary | ICD-10-CM

## 2018-09-09 DIAGNOSIS — Z79899 Other long term (current) drug therapy: Secondary | ICD-10-CM

## 2018-09-09 DIAGNOSIS — R6889 Other general symptoms and signs: Secondary | ICD-10-CM | POA: Diagnosis not present

## 2018-09-09 NOTE — Patient Instructions (Signed)
Can do citracel or benefiber or add on fiber supplement to prevent flare of diverticulitis If you do have a flare such as left lower quadrant pain, diarrhea avoid fiber, do bowel rest which involves liquid diet at first with broth and jello and then slowly advance your diet to bland foods such as potatoes, noodles, etc and then add back in fiber.    Diverticulitis Diverticulitis is inflammation or infection of small pouches in your colon that form when you have a condition called diverticulosis. The pouches in your colon are called diverticula. Your colon, or large intestine, is where water is absorbed and stool is formed. Complications of diverticulitis can include:  Bleeding.  Severe infection.  Severe pain.  Perforation of your colon.  Obstruction of your colon.  What are the causes? Diverticulitis is caused by bacteria. Diverticulitis happens when stool becomes trapped in diverticula. This allows bacteria to grow in the diverticula, which can lead to inflammation and infection. What increases the risk? People with diverticulosis are at risk for diverticulitis. Eating a diet that does not include enough fiber from fruits and vegetables may make diverticulitis more likely to develop. What are the signs or symptoms? Symptoms of diverticulitis may include:  Abdominal pain and tenderness. The pain is normally located on the left side of the abdomen, but may occur in other areas.  Fever and chills.  Bloating.  Cramping.  Nausea.  Vomiting.  Constipation.  Diarrhea.  Blood in your stool.  How is this diagnosed? Your health care provider will ask you about your medical history and do a physical exam. You may need to have tests done because many medical conditions can cause the same symptoms as diverticulitis. Tests may include:  Blood tests.  Urine tests.  Imaging tests of the abdomen, including X-rays and CT scans.  When your condition is under control, your health  care provider may recommend that you have a colonoscopy. A colonoscopy can show how severe your diverticula are and whether something else is causing your symptoms. How is this treated? Most cases of diverticulitis are mild and can be treated at home. Treatment may include:  Taking over-the-counter pain medicines.  Following a clear liquid diet.  Taking antibiotic medicines by mouth for 7-10 days.  More severe cases may be treated at a hospital. Treatment may include:  Not eating or drinking.  Taking prescription pain medicine.  Receiving antibiotic medicines through an IV tube.  Receiving fluids and nutrition through an IV tube.  Surgery.  Follow these instructions at home:  Follow your health care provider's instructions carefully.  Follow a full liquid diet or other diet as directed by your health care provider. After your symptoms improve, your health care provider may tell you to change your diet. He or she may recommend you eat a high-fiber diet. Fruits and vegetables are good sources of fiber. Fiber makes it easier to pass stool.  Take fiber supplements or probiotics as directed by your health care provider.  Only take medicines as directed by your health care provider.  Keep all your follow-up appointments. Contact a health care provider if:  Your pain does not improve.  You have a hard time eating food.  Your bowel movements do not return to normal. Get help right away if:  Your pain becomes worse.  Your symptoms do not get better.  Your symptoms suddenly get worse.  You have a fever.  You have repeated vomiting.  You have bloody or black, tarry stools. This information  is not intended to replace advice given to you by your health care provider. Make sure you discuss any questions you have with your health care provider. Document Released: 05/08/2005 Document Revised: 01/04/2016 Document Reviewed: 06/23/2013 Elsevier Interactive Patient Education  2017  [place] mindful eating and here are some tips and tricks below.   Rate your hunger before you eat on a scale of 1-10, try to eat closer to a 6 or higher. And if you are at below that, why are you eating? Slow down and listen to your body.

## 2018-09-10 LAB — COMPLETE METABOLIC PANEL WITH GFR
AG Ratio: 2 (calc) (ref 1.0–2.5)
ALT: 24 U/L (ref 6–29)
AST: 18 U/L (ref 10–35)
Albumin: 4.8 g/dL (ref 3.6–5.1)
Alkaline phosphatase (APISO): 68 U/L (ref 33–130)
BUN: 12 mg/dL (ref 7–25)
CO2: 27 mmol/L (ref 20–32)
Calcium: 10.2 mg/dL (ref 8.6–10.4)
Chloride: 106 mmol/L (ref 98–110)
Creat: 0.76 mg/dL (ref 0.50–0.99)
GFR, Est African American: 93 mL/min/{1.73_m2} (ref >=60)
GFR, Est Non African American: 81 mL/min/{1.73_m2} (ref >=60)
Globulin: 2.4 g/dL (calc) (ref 1.9–3.7)
Glucose, Bld: 93 mg/dL (ref 65–99)
Potassium: 4.3 mmol/L (ref 3.5–5.3)
Sodium: 143 mmol/L (ref 135–146)
Total Bilirubin: 0.7 mg/dL (ref 0.2–1.2)
Total Protein: 7.2 g/dL (ref 6.1–8.1)

## 2018-09-10 LAB — CBC WITH DIFFERENTIAL/PLATELET
Absolute Monocytes: 301 cells/uL (ref 200–950)
Basophils Absolute: 31 cells/uL (ref 0–200)
Basophils Relative: 0.6 %
Eosinophils Absolute: 168 cells/uL (ref 15–500)
Eosinophils Relative: 3.3 %
HEMATOCRIT: 39.5 % (ref 35.0–45.0)
Hemoglobin: 13.1 g/dL (ref 11.7–15.5)
Lymphs Abs: 2468 cells/uL (ref 850–3900)
MCH: 29.6 pg (ref 27.0–33.0)
MCHC: 33.2 g/dL (ref 32.0–36.0)
MCV: 89.4 fL (ref 80.0–100.0)
MPV: 9.8 fL (ref 7.5–12.5)
Monocytes Relative: 5.9 %
NEUTROS PCT: 41.8 %
Neutro Abs: 2132 cells/uL (ref 1500–7800)
Platelets: 315 10*3/uL (ref 140–400)
RBC: 4.42 10*6/uL (ref 3.80–5.10)
RDW: 12.5 % (ref 11.0–15.0)
Total Lymphocyte: 48.4 %
WBC: 5.1 10*3/uL (ref 3.8–10.8)

## 2018-09-10 LAB — LIPID PANEL
CHOLESTEROL: 189 mg/dL (ref <200)
HDL: 50 mg/dL — ABNORMAL LOW (ref >50)
LDL Cholesterol (Calc): 113 mg/dL (calc) — ABNORMAL HIGH
Non-HDL Cholesterol (Calc): 139 mg/dL (calc) — ABNORMAL HIGH (ref <130)
Total CHOL/HDL Ratio: 3.8 (calc) (ref <5.0)
Triglycerides: 144 mg/dL (ref <150)

## 2018-09-10 LAB — HEMOGLOBIN A1C
HEMOGLOBIN A1C: 5.7 %{Hb} — AB (ref <5.7)
Mean Plasma Glucose: 117 (calc)
eAG (mmol/L): 6.5 (calc)

## 2018-09-10 LAB — TSH: TSH: 2.24 mIU/L (ref 0.40–4.50)

## 2018-09-15 ENCOUNTER — Other Ambulatory Visit: Payer: Self-pay | Admitting: Internal Medicine

## 2018-09-15 DIAGNOSIS — Z1231 Encounter for screening mammogram for malignant neoplasm of breast: Secondary | ICD-10-CM

## 2018-09-25 ENCOUNTER — Encounter: Payer: Self-pay | Admitting: Adult Health Nurse Practitioner

## 2018-09-25 ENCOUNTER — Ambulatory Visit (INDEPENDENT_AMBULATORY_CARE_PROVIDER_SITE_OTHER): Payer: Medicare Other | Admitting: Adult Health Nurse Practitioner

## 2018-09-25 ENCOUNTER — Ambulatory Visit: Payer: Self-pay | Admitting: Physician Assistant

## 2018-09-25 VITALS — BP 130/78 | HR 88 | Temp 97.5°F | Ht 63.5 in | Wt 146.0 lb

## 2018-09-25 DIAGNOSIS — R062 Wheezing: Secondary | ICD-10-CM

## 2018-09-25 DIAGNOSIS — R0989 Other specified symptoms and signs involving the circulatory and respiratory systems: Secondary | ICD-10-CM

## 2018-09-25 DIAGNOSIS — R0982 Postnasal drip: Secondary | ICD-10-CM | POA: Diagnosis not present

## 2018-09-25 DIAGNOSIS — R059 Cough, unspecified: Secondary | ICD-10-CM

## 2018-09-25 DIAGNOSIS — R05 Cough: Secondary | ICD-10-CM

## 2018-09-25 DIAGNOSIS — J40 Bronchitis, not specified as acute or chronic: Secondary | ICD-10-CM

## 2018-09-25 MED ORDER — PREDNISONE 10 MG PO TABS: ORAL_TABLET | ORAL | 0 refills | Status: DC

## 2018-09-25 NOTE — Patient Instructions (Addendum)
Today you are being treated for:   Upper Respiratory Infection    Treat the symptoms!  Please take these medications:    We have sent in Prednisone taper for you to take.  Take this with food.  Allergy Symptoms / Runny Nose:  Zyrtec / Cetirizine Take 10mg  by mouth May cause drowsiness, take nightly Be sure to drink plenty of water If this is not effective, try Xyzal or Allegra  Xyzal / Levocetirazine  Take 5mg  by mouth May cause drowsiness, take nightly Be sure to drink plenty of water If this is not effective try Allegra or Zyrtec  Allegra / fexofenadine Take 180mg  by mouth daily If this is not effective try Zyrtec or Xyzal      Cough:  Mucinex  DM Take one tablet by mouth every 12 hours with plenty of water while you have symptoms. This is an expectorant and will help to clear the congestion in your lungs.  Delsym Cough Syrup: 34ml every 4 hours as needed for cough.   This is a cough suppressant.   You may take this WITH the plain mucinex (blue)    Chest Congestion:  Cool-mist humidifier / vaporizer Or warm steamy shower  Menthol Chest rubs You can get at any pharmacy Use as directed on packaging     Fevers and or pain  Tylenol Extra Strength 500mg  Take 1-2 tablets every 8 hours as needed for fever or pain Do not take more than 4,000mg  to tylenol in 24 hours  May rotate between Tylenol and ibuprofen.  They are different.   Ibuprofen / Advil / Motrin - Will also help with inflammation  600mg  every six hours OR 800mg  every 8 hours If you have this at home each tablet is 200mg .   Antibiotic: Take as directed on your prescription   Sore Throat: Should this return Cepacol lozenges as needed You can get these at any pharmacy  Throat Spray: Use as directed on package as needed You may get this at any pharmacy  Warm / Cold drinks: This will help to sooth your throat  Gargle warm salt water: Gargle then spit water out, do not  swallow    General Care:  Drink plenty of clear fluids  Get plenty of rest  Wash hands frequently and sanitize shared surfaces, kitchens, bathrooms.  Call or return with new or worsening symptoms

## 2018-09-25 NOTE — Progress Notes (Signed)
Assessment and Plan:  Kathryn Lamb was seen today for acute visit.  Diagnoses and all orders for this visit:  Bronchitis Patient would like to treat symptoms at this time  Wheezing -     predniSONE (DELTASONE) 10 MG tablet; Prednisone 10mg , take 4 tablets on day one, 3 tablets for one day, 2 tablets for one day, then one tablet two days.  Cough and Chest congestion Discussed OTC Mucinex DM Q12 while symptomatic Increase water intake, at least 80oz or more daily OTC menthol chest rubs Cool mist humidifier or warm shower  Post-nasal drainage Choose an antihistamine to help dry up nasal drainage.  Zyrtec / Cetirizine Take 10mg  by mouth May cause drowsiness, take nightly Be sure to drink plenty of water If this is not effective, try Xyzal OR Allegra  OR  Xyzal / Levocetirazine  Take 5mg  by mouth May cause drowsiness, take nightly Be sure to drink plenty of water If this is not effective try Allegra OR Zyrtec  OR  Allegra / fexofenadine Take 180mg  by mouth If this is not effective try Zyrtec OR Xyzal   *If you battle with chronic allergies you may need to change the antihistamine you currently use to find most effective.  For discomfort of fever you may take: buprofen / Advil / Motrin, will help with inflammation/pain/fever  600mg  every six hours OR 800mg  every 8 hours If you have this at home each tablet is 200mg .   AND / OR  Tylenol Extra Strength 500mg  will help with pain or fever Take 1-2 tablets every 8 hours as needed for fever or pain Do not take more than 4,000mg  to tylenol in 24 hours  General Care: Drink plenty of clear fluids Get plenty of rest Wash hands frequently and sanitize shared surfaces, kitchens, bathrooms.  Discussed monitoring symptoms if no improvement consider bacteria involvement?  Call or return with new or worsening symptoms as discussed in appointment.  May contact via office phone [phone] or via [place].  Further disposition  pending results of labs. Discussed med's effects and SE's.   Over 30 minutes of exam, counseling, chart review, and critical decision making was performed.   Future Appointments  Date Time Provider [place]  11/10/2018  1:00 PM [facility] MM 2 [facility]  11/10/2018  1:30 PM [facility] DX DEXA 1 [facility]  01/11/2019 11:30 AM Vicie Mutters, PA-C [facility] None  04/29/2019  2:00 PM Unk Pinto, MD [facility] None  09/15/2019  2:00 PM Vicie Mutters, PA-C [facility] None    ------------------------------------------------------------------------------------------------------------------   HPI 68 y.o.female presents for URI symptoms that began ten days ago.  She increased her water intake and has been taking Mucinex every12 hours.  She reports she has a cough that is non-productive and her chest feels tight and discomfort with coughing.  Reports she had such coughing fits that is was difficult to sleep  She tried taking cough drops for this.  She denies fever or chills.  She denies any sore throat but when this first started it was sore.  She is having drainage down the back of her throat  She reports that both of her ears feel stopped up.  She has been taking ibuprofen 400mg  at night. She is not sure if she it treating her symptoms appropriately and reports she does not want to take antibiotics.  Past Medical History:  Diagnosis Date  . Anxiety   . Depression   . Diverticulitis   . Ischemic colitis ([place])   .  Mixed hyperlipidemia 10/05/2013  . Rosacea      Allergies  Allergen Reactions  . Doxycycline   . Erythromycin   . Flagyl [Metronidazole]   . Penicillins   . Sulfa Antibiotics     Current Outpatient Medications on File Prior to Visit  Medication Sig  . atorvastatin (LIPITOR) 80 MG tablet TAKE 1 TABLET BY MOUTH  DAILY  . buPROPion (WELLBUTRIN XL) 300 MG 24 hr tablet TAKE 1 TABLET BY MOUTH  DAILY  . cetirizine (ZYRTEC) 5 MG tablet Take 5 mg by mouth  daily.  . Cholecalciferol (VITAMIN D) 2000 UNITS tablet Take 2,000 Units by mouth daily.  . Flaxseed, Linseed, (FLAX SEED OIL) 1000 MG CAPS Take 2,600 mg by mouth daily.   Marland Kitchen gabapentin (NEURONTIN) 100 MG capsule TAKE 2 CAPSULES BY MOUTH 3  TIMES DAILY  . Multiple Vitamin (MULTIVITAMIN WITH MINERALS) TABS tablet Take 1 tablet by mouth. Takes 3 times per week. M,W,F  . Omega-3 Fatty Acids (FISH OIL) 1000 MG CAPS Take 3,000 mg by mouth daily.  . Probiotic Product (PROBIOTIC DAILY PO) Take by mouth daily.   No current facility-administered medications on file prior to visit.     ZLD:JTTSVX of Systems  Constitutional: Negative for chills, diaphoresis, fever, malaise/fatigue and weight loss.  HENT: Positive for ear pain. Negative for congestion, ear discharge, hearing loss, nosebleeds, sinus pain, sore throat and tinnitus.        Ear fullness  Eyes: Negative for blurred vision, double vision, photophobia, pain, discharge and redness.  Respiratory: Positive for cough and wheezing. Negative for hemoptysis, sputum production, shortness of breath and stridor.   Cardiovascular: Negative for chest pain, palpitations, orthopnea, claudication, leg swelling and PND.  Gastrointestinal: Negative for abdominal pain, blood in stool, constipation, diarrhea, heartburn, melena, nausea and vomiting.  Musculoskeletal: Negative for back pain, falls, joint pain, myalgias and neck pain.  Skin: Negative for itching and rash.    Physical Exam:  BP 130/78   Pulse 88   Temp (!) 97.5 F (36.4 C)   Ht 5' 3.5" (1.613 m)   Wt 146 lb (66.2 kg)   SpO2 99%   BMI 25.46 kg/m   General Appearance: Well nourished, in no apparent distress. Eyes: PERRLA, EOMs, conjunctiva no swelling or erythema Sinuses: No Frontal/maxillary tenderness ENT/Mouth: Ext aud canals clear, TMs without erythema, bulging.  Serous noted behind bilateral TM's. No erythema, swelling, or exudate on post pharynx. Mild clear drainage noted post  pharynx.  Tonsils not swollen or erythematous. Hearing normal.  Neck: Supple, thyroid normal.  Respiratory: Respiratory effort normal, BS equal bilaterally without rales,or stridor. Bilateral wheezing to upper lobes. Course Rhonchi noted bilaterally. Cardio: RRR with no MRGs. Brisk peripheral pulses without edema.  Abdomen: Soft, + BS.  Non tender, no guarding, rebound, hernias, masses. Lymphatics: Non tender without lymphadenopathy.  Musculoskeletal: Full ROM, 5/5 strength, normal gait.  Skin: Warm, dry without rashes, lesions, ecchymosis.  Neuro: Cranial nerves intact. Normal muscle tone, no cerebellar symptoms. Sensation intact.  Psych: Awake and oriented X 3, normal affect, Insight and Judgment appropriate.     Garnet Sierras, NP 9:21 AM [facility]

## 2018-10-05 ENCOUNTER — Telehealth: Payer: Self-pay

## 2018-10-05 NOTE — Telephone Encounter (Signed)
Patient is requesting Prenisone, feels that this will work. Does not want to try the antibiotic right now.

## 2018-10-05 NOTE — Telephone Encounter (Signed)
Patient was seen in our office on 09/25/18. Patient still has tightness in her chest. Has finished the Prednisone a week ago. Requesting another prescription.

## 2018-10-06 ENCOUNTER — Other Ambulatory Visit: Payer: Self-pay | Admitting: Adult Health Nurse Practitioner

## 2018-10-06 DIAGNOSIS — J40 Bronchitis, not specified as acute or chronic: Secondary | ICD-10-CM

## 2018-10-06 MED ORDER — PREDNISONE 10 MG (21) PO TBPK: ORAL_TABLET | Freq: Every day | ORAL | 0 refills | Status: DC

## 2018-10-12 ENCOUNTER — Other Ambulatory Visit: Payer: Self-pay | Admitting: Internal Medicine

## 2018-11-10 ENCOUNTER — Other Ambulatory Visit: Payer: Medicare Other

## 2018-11-10 ENCOUNTER — Ambulatory Visit: Payer: Medicare Other

## 2019-01-11 ENCOUNTER — Ambulatory Visit: Payer: Medicare Other

## 2019-01-11 ENCOUNTER — Ambulatory Visit: Payer: Self-pay | Admitting: Physician Assistant

## 2019-01-11 ENCOUNTER — Other Ambulatory Visit: Payer: Medicare Other

## 2019-03-24 ENCOUNTER — Other Ambulatory Visit: Payer: Self-pay

## 2019-03-24 ENCOUNTER — Ambulatory Visit
Admission: RE | Admit: 2019-03-24 | Discharge: 2019-03-24 | Disposition: A | Discharge status: Home / Self Care | Payer: Medicare Other | Source: Ambulatory Visit | Attending: Physician Assistant | Admitting: Physician Assistant

## 2019-03-24 ENCOUNTER — Ambulatory Visit
Admission: RE | Admit: 2019-03-24 | Discharge: 2019-03-24 | Disposition: A | Discharge status: Home / Self Care | Payer: Medicare Other | Source: Ambulatory Visit | Attending: Internal Medicine | Admitting: Internal Medicine

## 2019-03-24 DIAGNOSIS — Z1231 Encounter for screening mammogram for malignant neoplasm of breast: Secondary | ICD-10-CM | POA: Diagnosis not present

## 2019-03-24 DIAGNOSIS — M8580 Other specified disorders of bone density and structure, unspecified site: Secondary | ICD-10-CM

## 2019-03-24 DIAGNOSIS — Z780 Asymptomatic menopausal state: Secondary | ICD-10-CM | POA: Diagnosis not present

## 2019-03-24 DIAGNOSIS — M85852 Other specified disorders of bone density and structure, left thigh: Secondary | ICD-10-CM | POA: Diagnosis not present

## 2019-04-14 ENCOUNTER — Other Ambulatory Visit: Payer: Self-pay | Admitting: Physician Assistant

## 2019-04-28 ENCOUNTER — Encounter: Payer: Self-pay | Admitting: Internal Medicine

## 2019-04-28 NOTE — Patient Instructions (Signed)

## 2019-04-28 NOTE — Progress Notes (Signed)
Annual Screening/Preventative Visit & Comprehensive Evaluation &  Examination     This very nice 68 y.o. MWF presents for a Screening /Preventative Visit & comprehensive evaluation and management of multiple medical co-morbidities.  Patient has been followed for HTN, HLD, Prediabetes  and Vitamin D Deficiency. Patient is also followed by Dr Earlean Shawl for IBS.     Patient has hx/o labile HTN  Being followed expectantly off meds.  Patient's BP has been controlled at home and patient denies any cardiac symptoms as chest pain, palpitations, shortness of breath, dizziness or ankle swelling. Today's BP is at goal - 116/72.      Patient's hyperlipidemia is not controlled with diet. Last lipids were not at goal:  Lab Results  Component Value Date   CHOL 189 09/09/2018   HDL 50 (L) 09/09/2018   LDLCALC 113 (H) 09/09/2018   TRIG 144 09/09/2018   CHOLHDL 3.8 09/09/2018      Patient has hx/o prediabetes (A1c 5.7% / 2016)  and patient denies reactive hypoglycemic symptoms, visual blurring, diabetic polys or paresthesias. Last A1c was not at goal: Lab Results  Component Value Date   HGBA1C 5.7 (H) 09/09/2018      Finally, patient has history of Vitamin D Deficiency and last Vitamin D was still low: Lab Results  Component Value Date   VD25OH 41 03/09/2018   Current Outpatient Medications on File Prior to Visit  Medication Sig  . atorvastatin (LIPITOR) 80 MG tablet Take 1 tablet Daily for Cholesterol  . buPROPion (WELLBUTRIN XL) 300 MG 24 hr tablet Take 1 tablet every Morning for Mood, Focus & Concentration  . cetirizine (ZYRTEC) 5 MG tablet Take 5 mg by mouth daily.  . Flaxseed, Linseed, (FLAX SEED OIL) 1000 MG CAPS Take 2,600 mg by mouth daily.   Marland Kitchen gabapentin (NEURONTIN) 100 MG capsule Take 2 capsules 3 x /day for Pain  . Multiple Vitamin (MULTIVITAMIN WITH MINERALS) TABS tablet Take 1 tablet by mouth. Takes 3 times per week. M,W,F  . Omega-3 Fatty Acids (FISH OIL) 1000 MG CAPS Take 3,000 mg by  mouth daily.  . Probiotic Product (PROBIOTIC DAILY PO) Take by mouth daily.  Marland Kitchen VITAMIN D PO Take 5,000 Units by mouth 2 (two) times daily.   No current facility-administered medications on file prior to visit.    Allergies  Allergen Reactions  . Doxycycline   . Erythromycin   . Flagyl [Metronidazole]   . Penicillins   . Sulfa Antibiotics    Past Medical History:  Diagnosis Date  . Anxiety   . Depression   . Diverticulitis   . Ischemic colitis ([place])   . Mixed hyperlipidemia 10/05/2013  . Rosacea    Health Maintenance  Topic Date Due  . INFLUENZA VACCINE  03/13/2019  . MAMMOGRAM  03/23/2020  . TETANUS/TDAP  04/07/2022  . COLONOSCOPY  12/16/2027  . DEXA SCAN  Completed  . Hepatitis C Screening  Completed  . PNA vac Low Risk Adult  Discontinued   Immunization History  Administered Date(s) Administered  . Influenza Split 05/17/2013, 05/17/2014  . Influenza, High Dose Seasonal PF 05/09/2015, 06/18/2016, 05/30/2017, 06/01/2018  . Influenza-Unspecified 06/03/2018  . PPD Test 05/04/2014  . Pneumococcal Conjugate-13 05/09/2015  . Pneumococcal Polysaccharide-23 10/04/2013  . Td 08/30/2002  . Tdap 04/07/2012  . Varicella Zoster Immune Globulin 03/15/2016   Last Colon - 12/15/2017 - Dr Earlean Shawl - Mod / severe Diverticulosis- Recc 10 yr f/u due May 2029  Last MGM  & BMD - 03/24/2019  Past  Surgical History:  Procedure Laterality Date  . ABDOMINAL HYSTERECTOMY    . APPENDECTOMY    . CARPAL TUNNEL RELEASE Right 2004  . LUMBAR LAMINECTOMY  2000  . MICRODISCECTOMY LUMBAR    . TUBAL LIGATION     Family History  Problem Relation Age of Onset  . Diabetes Father   . Alcohol abuse Father   . Cancer Father        prostate, lung  . Hypertension Mother   . Stroke Mother   . Heart disease Mother   . Diabetes Sister    Social History   Tobacco Use  . Smoking status: Former Smoker    Packs/day: 1.00    Types: Cigarettes    Quit date: 10/04/2008    Years since quitting: 10.5   . Smokeless tobacco: Never Used  Substance Use Topics  . Alcohol use: No  . Drug use: No    ROS Constitutional: Denies fever, chills, weight loss/gain, headaches, insomnia,  night sweats, and change in appetite. Does c/o fatigue. Eyes: Denies redness, blurred vision, diplopia, discharge, itchy, watery eyes.  ENT: Denies discharge, congestion, post nasal drip, epistaxis, sore throat, earache, hearing loss, dental pain, Tinnitus, Vertigo, Sinus pain, snoring.  Cardio: Denies chest pain, palpitations, irregular heartbeat, syncope, dyspnea, diaphoresis, orthopnea, PND, claudication, edema Respiratory: denies cough, dyspnea, DOE, pleurisy, hoarseness, laryngitis, wheezing.  Gastrointestinal: Denies dysphagia, heartburn, reflux, water brash, pain, cramps, nausea, vomiting, bloating, diarrhea, constipation, hematemesis, melena, hematochezia, jaundice, hemorrhoids Genitourinary: Denies dysuria, frequency, urgency, nocturia, hesitancy, discharge, hematuria, flank pain Breast: Breast lumps, nipple discharge, bleeding.  Musculoskeletal: Denies arthralgia, myalgia, stiffness, Jt. Swelling, pain, limp, and strain/sprain. Denies falls. Skin: Denies puritis, rash, hives, warts, acne, eczema, changing in skin lesion Neuro: No weakness, tremor, incoordination, spasms, paresthesia, pain Psychiatric: Denies confusion, memory loss, sensory loss. Denies Depression. Endocrine: Denies change in weight, skin, hair change, nocturia, and paresthesia, diabetic polys, visual blurring, hyper / hypo glycemic episodes.  Heme/Lymph: No excessive bleeding, bruising, enlarged lymph nodes.  Physical Exam  BP 116/72   Pulse 72   Temp (!) 97.1 F (36.2 C)   Resp 16   Ht 5\' 3"  (1.6 m)   Wt 147 lb (66.7 kg)   BMI 26.04 kg/m   General Appearance: Well nourished, well groomed and in no apparent distress.  Eyes: PERRLA, EOMs, conjunctiva no swelling or erythema, normal fundi and vessels. Sinuses: No frontal/maxillary  tenderness ENT/Mouth: EACs patent / TMs  nl. Nares clear without erythema, swelling, mucoid exudates. Oral hygiene is good. No erythema, swelling, or exudate. Tongue normal, non-obstructing. Tonsils not swollen or erythematous. Hearing normal.  Neck: Supple, thyroid not palpable. No bruits, nodes or JVD. Respiratory: Respiratory effort normal.  BS equal and clear bilateral without rales, rhonci, wheezing or stridor. Cardio: Heart sounds are normal with regular rate and rhythm and no murmurs, rubs or gallops. Peripheral pulses are normal and equal bilaterally without edema. No aortic or femoral bruits. Chest: symmetric with normal excursions and percussion. Breasts: Symmetric, without lumps, nipple discharge, retractions, or fibrocystic changes.  Abdomen: Flat, soft with bowel sounds active. Nontender, no guarding, rebound, hernias, masses, or organomegaly.  Lymphatics: Non tender without lymphadenopathy.  Genitourinary:  Musculoskeletal: Full ROM all peripheral extremities, joint stability, 5/5 strength, and normal gait. Skin: Warm and dry without rashes, lesions, cyanosis, clubbing or  ecchymosis.  Neuro: Cranial nerves intact, reflexes equal bilaterally. Normal muscle tone, no cerebellar symptoms. Sensation intact.  Pysch: Alert and oriented X 3, normal affect, Insight and Judgment appropriate.  Assessment and Plan  1. Annual Preventative Screening Examination  2. Labile hypertension  - EKG 12-Lead - Urinalysis, Routine w reflex microscopic - Microalbumin / creatinine urine ratio - CBC with Differential/Platelet - COMPLETE METABOLIC PANEL WITH GFR - Magnesium - TSH  3. Hyperlipidemia, mixed  - EKG 12-Lead - Lipid panel - TSH  4. Abnormal glucose  - EKG 12-Lead - Hemoglobin A1c - Insulin, random  5. Vitamin D deficiency  - VITAMIN D 25 Hydroxyl  6. Prediabetes  - EKG 12-Lead - Hemoglobin A1c - Insulin, random  7. Irritable bowel syndrome   8. Screening for  colorectal cancer  - POC Hemoccult Bld/Stl  9. Screening for ischemic heart disease  - EKG 12-Lead  10. FHx: heart disease  - EKG 12-Lead  11. Medication management  - Urinalysis, Routine w reflex microscopic - Microalbumin / creatinine urine ratio - CBC with Differential/Platelet - COMPLETE METABOLIC PANEL WITH GFR - Magnesium - Lipid panel - TSH - Hemoglobin A1c - Insulin, random - VITAMIN D 25 Hydroxyl         Patient was counseled in prudent diet to achieve/maintain BMI less than 25 for weight control, BP monitoring, regular exercise and medications. Discussed med's effects and SE's. Screening labs and tests as requested with regular follow-up as recommended. Over 40 minutes of exam, counseling, chart review and high complex critical decision making was performed.   Kirtland Bouchard, MD

## 2019-04-29 ENCOUNTER — Other Ambulatory Visit: Payer: Self-pay

## 2019-04-29 ENCOUNTER — Ambulatory Visit (INDEPENDENT_AMBULATORY_CARE_PROVIDER_SITE_OTHER): Payer: Medicare Other | Admitting: Internal Medicine

## 2019-04-29 VITALS — BP 116/72 | HR 72 | Temp 97.1°F | Resp 16 | Ht 63.0 in | Wt 147.0 lb

## 2019-04-29 DIAGNOSIS — I10 Essential (primary) hypertension: Secondary | ICD-10-CM

## 2019-04-29 DIAGNOSIS — R7303 Prediabetes: Secondary | ICD-10-CM | POA: Diagnosis not present

## 2019-04-29 DIAGNOSIS — Z1212 Encounter for screening for malignant neoplasm of rectum: Secondary | ICD-10-CM

## 2019-04-29 DIAGNOSIS — R0989 Other specified symptoms and signs involving the circulatory and respiratory systems: Secondary | ICD-10-CM | POA: Diagnosis not present

## 2019-04-29 DIAGNOSIS — Z79899 Other long term (current) drug therapy: Secondary | ICD-10-CM

## 2019-04-29 DIAGNOSIS — E559 Vitamin D deficiency, unspecified: Secondary | ICD-10-CM | POA: Diagnosis not present

## 2019-04-29 DIAGNOSIS — K589 Irritable bowel syndrome without diarrhea: Secondary | ICD-10-CM

## 2019-04-29 DIAGNOSIS — R7309 Other abnormal glucose: Secondary | ICD-10-CM | POA: Diagnosis not present

## 2019-04-29 DIAGNOSIS — E782 Mixed hyperlipidemia: Secondary | ICD-10-CM | POA: Diagnosis not present

## 2019-04-29 DIAGNOSIS — Z0000 Encounter for general adult medical examination without abnormal findings: Secondary | ICD-10-CM | POA: Diagnosis not present

## 2019-04-29 DIAGNOSIS — Z1211 Encounter for screening for malignant neoplasm of colon: Secondary | ICD-10-CM

## 2019-04-29 DIAGNOSIS — Z8249 Family history of ischemic heart disease and other diseases of the circulatory system: Secondary | ICD-10-CM | POA: Diagnosis not present

## 2019-04-29 DIAGNOSIS — Z136 Encounter for screening for cardiovascular disorders: Secondary | ICD-10-CM

## 2019-04-29 DIAGNOSIS — Z0001 Encounter for general adult medical examination with abnormal findings: Secondary | ICD-10-CM

## 2019-04-30 LAB — COMPLETE METABOLIC PANEL WITH GFR
AG Ratio: 2.3 (calc) (ref 1.0–2.5)
ALT: 27 U/L (ref 6–29)
AST: 18 U/L (ref 10–35)
Albumin: 4.8 g/dL (ref 3.6–5.1)
Alkaline phosphatase (APISO): 52 U/L (ref 37–153)
BUN: 15 mg/dL (ref 7–25)
CO2: 30 mmol/L (ref 20–32)
Calcium: 9.9 mg/dL (ref 8.6–10.4)
Chloride: 104 mmol/L (ref 98–110)
Creat: 0.7 mg/dL (ref 0.50–0.99)
GFR, Est African American: 102 mL/min/{1.73_m2} (ref >=60)
GFR, Est Non African American: 88 mL/min/{1.73_m2} (ref >=60)
Globulin: 2.1 g/dL (calc) (ref 1.9–3.7)
Glucose, Bld: 81 mg/dL (ref 65–99)
Potassium: 4.1 mmol/L (ref 3.5–5.3)
Sodium: 141 mmol/L (ref 135–146)
Total Bilirubin: 0.7 mg/dL (ref 0.2–1.2)
Total Protein: 6.9 g/dL (ref 6.1–8.1)

## 2019-04-30 LAB — CBC WITH DIFFERENTIAL/PLATELET
Absolute Monocytes: 369 cells/uL (ref 200–950)
Basophils Absolute: 39 cells/uL (ref 0–200)
Basophils Relative: 0.7 %
Eosinophils Absolute: 171 cells/uL (ref 15–500)
Eosinophils Relative: 3.1 %
HCT: 37.8 % (ref 35.0–45.0)
Hemoglobin: 12.8 g/dL (ref 11.7–15.5)
Lymphs Abs: 2404 cells/uL (ref 850–3900)
MCH: 30.8 pg (ref 27.0–33.0)
MCHC: 33.9 g/dL (ref 32.0–36.0)
MCV: 91.1 fL (ref 80.0–100.0)
MPV: 9.7 fL (ref 7.5–12.5)
Monocytes Relative: 6.7 %
Neutro Abs: 2519 cells/uL (ref 1500–7800)
Neutrophils Relative %: 45.8 %
Platelets: 273 10*3/uL (ref 140–400)
RBC: 4.15 10*6/uL (ref 3.80–5.10)
RDW: 12.7 % (ref 11.0–15.0)
Total Lymphocyte: 43.7 %
WBC: 5.5 10*3/uL (ref 3.8–10.8)

## 2019-04-30 LAB — HEMOGLOBIN A1C
Hgb A1c MFr Bld: 5.8 % of total Hgb — ABNORMAL HIGH (ref <5.7)
Mean Plasma Glucose: 120 (calc)
eAG (mmol/L): 6.6 (calc)

## 2019-04-30 LAB — URINALYSIS, ROUTINE W REFLEX MICROSCOPIC
Bilirubin Urine: NEGATIVE
Glucose, UA: NEGATIVE
Hgb urine dipstick: NEGATIVE
Ketones, ur: NEGATIVE
Leukocytes,Ua: NEGATIVE
Nitrite: NEGATIVE
Protein, ur: NEGATIVE
Specific Gravity, Urine: 1.007 (ref 1.001–1.03)
pH: 7 (ref 5.0–8.0)

## 2019-04-30 LAB — MICROALBUMIN / CREATININE URINE RATIO
Creatinine, Urine: 27 mg/dL (ref 20–275)
Microalb Creat Ratio: 7 mcg/mg creat (ref <30)
Microalb, Ur: 0.2 mg/dL

## 2019-04-30 LAB — INSULIN, RANDOM: Insulin: 19.5 u[IU]/mL

## 2019-04-30 LAB — MAGNESIUM: Magnesium: 2.1 mg/dL (ref 1.5–2.5)

## 2019-04-30 LAB — LIPID PANEL
Cholesterol: 181 mg/dL (ref <200)
HDL: 47 mg/dL — ABNORMAL LOW (ref >=50)
LDL Cholesterol (Calc): 103 mg/dL (calc) — ABNORMAL HIGH
Non-HDL Cholesterol (Calc): 134 mg/dL (calc) — ABNORMAL HIGH (ref <130)
Total CHOL/HDL Ratio: 3.9 (calc) (ref <5.0)
Triglycerides: 194 mg/dL — ABNORMAL HIGH (ref <150)

## 2019-04-30 LAB — TSH: TSH: 2.28 mIU/L (ref 0.40–4.50)

## 2019-04-30 LAB — VITAMIN D 25 HYDROXY (VIT D DEFICIENCY, FRACTURES): Vit D, 25-Hydroxy: 82 ng/mL (ref 30–100)

## 2019-05-01 ENCOUNTER — Encounter: Payer: Self-pay | Admitting: Internal Medicine

## 2019-06-17 ENCOUNTER — Ambulatory Visit: Payer: Medicare Other

## 2019-07-30 NOTE — Progress Notes (Signed)
Assessment and Plan:  Hypertension, unspecified type -     CBC with Diff -     COMPLETE METABOLIC PANEL WITH GFR -     TSH - continue medications, DASH diet, exercise and monitor at home. Call if greater than 130/80.   History of ischemic colitis Monitor, continue follow up GI  Recurrent major depressive disorder, in full remission ([place]) - continue medications, stress management techniques discussed, increase water, good sleep hygiene discussed, increase exercise, and increase veggies.   Mixed hyperlipidemia -     Lipid Profile check lipids decrease fatty foods increase activity.   Medication management -     Magnesium  Vitamin D deficiency -     Vitamin D (25 hydroxy)  Abnormal glucose -     Hemoglobin A1c ([place]) Discussed disease progression and risks Discussed diet/exercise, weight management and risk modification  Other orders -     Flu vaccine HIGH DOSE PF (Fluzone High dose)    Continue diet and meds as discussed. Further disposition pending results of labs.  HPI 68 y.o. female  presents for 3 month follow up with hypertension, hyperlipidemia, prediabetes and vitamin D.  She is staying with her mom every 2 nights, switching with her sister Butch Penny, she states her husbands hours changed and she feels she is never alone, she is on wellbutrin and doing well with this.   Her blood pressure has been controlled at home, she was in the dentist for 2 hours before this and now will likely have to go to oral surgeon, today their BP is BP: 140/76.  BP Readings from Last 3 Encounters:  08/02/19 140/76  04/29/19 116/72  09/25/18 130/78   She does workout. She denies chest pain, shortness of breath, dizziness.  She is on cholesterol medication, lipitor 80mg , and denies myalgias. Her cholesterol is at goal. The cholesterol last visit was:   Lab Results  Component Value Date   CHOL 181 04/29/2019   HDL 47 (L) 04/29/2019   LDLCALC 103 (H) 04/29/2019   TRIG 194 (H)  04/29/2019   CHOLHDL 3.9 04/29/2019   She has been working on diet and exercise for prediabetes, and denies paresthesia of the feet, polydipsia and polyuria. Last A1C in the office was:  Lab Results  Component Value Date   HGBA1C 5.8 (H) 04/29/2019   Patient is on Vitamin D supplement.   Lab Results  Component Value Date   VD25OH 82 04/29/2019    Current Medications:    Current Outpatient Medications (Cardiovascular):  .  atorvastatin (LIPITOR) 80 MG tablet, Take 1 tablet Daily for Cholesterol  Current Outpatient Medications (Respiratory):  .  cetirizine (ZYRTEC) 5 MG tablet, Take 5 mg by mouth daily.    Current Outpatient Medications (Other):  Marland Kitchen  buPROPion (WELLBUTRIN XL) 300 MG 24 hr tablet, Take 1 tablet every Morning for Mood, Focus & Concentration .  Flaxseed, Linseed, (FLAX SEED OIL) 1000 MG CAPS, Take 2,600 mg by mouth daily.  Marland Kitchen  gabapentin (NEURONTIN) 100 MG capsule, Take 2 capsules 3 x /day for Pain .  Multiple Vitamin (MULTIVITAMIN WITH MINERALS) TABS tablet, Take 1 tablet by mouth. Takes 3 times per week. M,W,F .  Omega-3 Fatty Acids (FISH OIL) 1000 MG CAPS, Take 3,000 mg by mouth daily. .  Probiotic Product (PROBIOTIC DAILY PO), Take by mouth daily. Marland Kitchen  VITAMIN D PO, Take 5,000 Units by mouth 2 (two) times daily.  Medical History:  Past Medical History:  Diagnosis Date  . Anxiety   .  Depression   . Diverticulitis   . Ischemic colitis ([place])   . Mixed hyperlipidemia 10/05/2013  . Rosacea    Allergies:  Allergies  Allergen Reactions  . Doxycycline   . Erythromycin   . Flagyl [Metronidazole]   . Penicillins   . Sulfa Antibiotics     Review of Systems:  Review of Systems  Constitutional: Negative.   HENT: Negative.   Eyes: Negative.   Respiratory: Negative.   Cardiovascular: Negative.   Gastrointestinal: Negative for abdominal pain, blood in stool, constipation, diarrhea, heartburn, melena, nausea and vomiting.  Genitourinary: Negative.    Musculoskeletal: Positive for back pain. Negative for falls, joint pain, myalgias and neck pain.  Skin: Negative.   Neurological: Negative.   Psychiatric/Behavioral: Negative.     Family history- Review and unchanged Social history- Review and unchanged Physical Exam: BP 140/76   Pulse 87   Temp (!) 97.3 F (36.3 C)   Wt 144 lb (65.3 kg)   SpO2 96%   BMI 25.51 kg/m  Wt Readings from Last 3 Encounters:  08/02/19 144 lb (65.3 kg)  04/29/19 147 lb (66.7 kg)  09/25/18 146 lb (66.2 kg)   General Appearance: Well nourished, in no apparent distress. Eyes: PERRLA, EOMs, conjunctiva no swelling or erythema Sinuses: No Frontal/maxillary tenderness ENT/Mouth: Ext aud canals clear, TMs without erythema, bulging. No erythema, swelling, or exudate on post pharynx.  Tonsils not swollen or erythematous. Hearing normal.  Neck: Supple, thyroid normal.  Respiratory: Respiratory effort normal, BS equal bilaterally without rales, rhonchi, wheezing or stridor.  Cardio: RRR with no MRGs. Brisk peripheral pulses without edema.  Abdomen: Soft, + BS.  Non tender, no guarding, rebound, hernias, masses. Lymphatics: Non tender without lymphadenopathy.  Musculoskeletal: Full ROM, 5/5 strength, normal gait.  Skin: Warm, dry without rashes, lesions, ecchymosis.  Neuro: Cranial nerves intact. Normal muscle tone, no cerebellar symptoms. Sensation intact.  Psych: Awake and oriented X 3, normal affect, Insight and Judgment appropriate.    Vicie Mutters, PA-C 3:04 PM [facility]

## 2019-08-02 ENCOUNTER — Encounter: Payer: Self-pay | Admitting: Physician Assistant

## 2019-08-02 ENCOUNTER — Other Ambulatory Visit: Payer: Self-pay

## 2019-08-02 ENCOUNTER — Ambulatory Visit (INDEPENDENT_AMBULATORY_CARE_PROVIDER_SITE_OTHER): Payer: Medicare Other | Admitting: Physician Assistant

## 2019-08-02 VITALS — BP 140/76 | HR 87 | Temp 97.3°F | Wt 144.0 lb

## 2019-08-02 DIAGNOSIS — Z79899 Other long term (current) drug therapy: Secondary | ICD-10-CM

## 2019-08-02 DIAGNOSIS — E782 Mixed hyperlipidemia: Secondary | ICD-10-CM | POA: Diagnosis not present

## 2019-08-02 DIAGNOSIS — R7309 Other abnormal glucose: Secondary | ICD-10-CM

## 2019-08-02 DIAGNOSIS — Z23 Encounter for immunization: Secondary | ICD-10-CM | POA: Diagnosis not present

## 2019-08-02 DIAGNOSIS — E559 Vitamin D deficiency, unspecified: Secondary | ICD-10-CM | POA: Diagnosis not present

## 2019-08-02 DIAGNOSIS — I10 Essential (primary) hypertension: Secondary | ICD-10-CM

## 2019-08-02 DIAGNOSIS — F3342 Major depressive disorder, recurrent, in full remission: Secondary | ICD-10-CM | POA: Diagnosis not present

## 2019-08-02 DIAGNOSIS — Z8719 Personal history of other diseases of the digestive system: Secondary | ICD-10-CM | POA: Diagnosis not present

## 2019-08-02 NOTE — Patient Instructions (Addendum)
There are some great apps too Check out [place], give thanks app.  Meditations apps are great like headspace.    HYPERTENSION INFORMATION  Monitor your blood pressure at home, please keep a record and bring that in with you to your next office visit.   Go to the ER if any CP, SOB, nausea, dizziness, severe HA, changes vision/speech  Omron BP cuffs is the brand I like  Testing/Procedures: HOW TO TAKE YOUR BLOOD PRESSURE:  Rest 5 minutes before taking your blood pressure.  Don't smoke or drink caffeinated beverages for at least 30 minutes before.  Take your blood pressure before (not after) you eat.  Sit comfortably with your back supported and both feet on the floor (don't cross your legs).  Elevate your arm to heart level on a table or a desk.  Use the proper sized cuff. It should fit smoothly and snugly around your bare upper arm. There should be enough room to slip a fingertip under the cuff. The bottom edge of the cuff should be 1 inch above the crease of the elbow.  Due to a recent study, SPRINT, we have changed our goal for the systolic or top blood pressure number. Ideally we want your top number at 120.  In the [facility] Trial, 5000 people were randomized to a goal BP of 120 and 5000 people were randomized to a goal BP of less than 140. The patients with the goal BP at 120 had LESS DEMENTIA, LESS HEART ATTACKS, AND LESS STROKES, AS WELL AS OVERALL DECREASED MORTALITY OR DEATH RATE.   There was another study that showed taking your blood pressure medications at night decrease cardiovascular events.  However if you are on a fluid pill, please take this in the morning.   If you are willing, our goal BP is the top number of 120.  Your most recent BP: BP: 140/76   Take your medications faithfully as instructed. Maintain a healthy weight. Get at least 150 minutes of aerobic exercise per week. Minimize salt intake. Minimize alcohol intake  DASH Eating Plan DASH stands for  "Dietary Approaches to Stop Hypertension." The DASH eating plan is a healthy eating plan that has been shown to reduce high blood pressure (hypertension). Additional health benefits may include reducing the risk of type 2 diabetes mellitus, heart disease, and stroke. The DASH eating plan may also help with weight loss. WHAT DO I NEED TO KNOW ABOUT THE DASH EATING PLAN? For the DASH eating plan, you will follow these general guidelines:  Choose foods with a percent daily value for sodium of less than 5% (as listed on the food label).  Use salt-free seasonings or herbs instead of table salt or sea salt.  Check with your health care provider or pharmacist before using salt substitutes.  Eat lower-sodium products, often labeled as "lower sodium" or "no salt added."  Eat fresh foods.  Eat more vegetables, fruits, and low-fat dairy products.  Choose whole grains. Look for the word "whole" as the first word in the ingredient list.  Choose fish and skinless chicken or [place] more often than red meat. Limit fish, poultry, and meat to 6 oz (170 g) each day.  Limit sweets, desserts, sugars, and sugary drinks.  Choose heart-healthy fats.  Limit cheese to 1 oz (28 g) per day.  Eat more home-cooked food and less restaurant, buffet, and fast food.  Limit fried foods.  Cook foods using methods other than frying.  Limit canned vegetables. If you do  use them, rinse them well to decrease the sodium.  When eating at a restaurant, ask that your food be prepared with less salt, or no salt if possible. WHAT FOODS CAN I EAT? Seek help from a dietitian for individual calorie needs. Grains Whole grain or whole wheat bread. Brown rice. Whole grain or whole wheat pasta. Quinoa, bulgur, and whole grain cereals. Low-sodium cereals. Corn or whole wheat flour tortillas. Whole grain cornbread. Whole grain crackers. Low-sodium crackers. Vegetables Fresh or frozen vegetables (raw, steamed, roasted, or grilled).  Low-sodium or reduced-sodium tomato and vegetable juices. Low-sodium or reduced-sodium tomato sauce and paste. Low-sodium or reduced-sodium canned vegetables.  Fruits All fresh, canned (in natural juice), or frozen fruits. Meat and Other Protein Products Ground beef (85% or leaner), grass-fed beef, or beef trimmed of fat. Skinless chicken or [place]. Ground chicken or [place]. Pork trimmed of fat. All fish and seafood. Eggs. Dried beans, peas, or lentils. Unsalted nuts and seeds. Unsalted canned beans. Dairy Low-fat dairy products, such as skim or 1% milk, 2% or reduced-fat cheeses, low-fat ricotta or cottage cheese, or plain low-fat yogurt. Low-sodium or reduced-sodium cheeses. Fats and Oils Tub margarines without trans fats. Light or reduced-fat mayonnaise and salad dressings (reduced sodium). Avocado. Safflower, olive, or canola oils. Natural peanut or almond butter. Other Unsalted popcorn and pretzels. The items listed above may not be a complete list of recommended foods or beverages. Contact your dietitian for more options. WHAT FOODS ARE NOT RECOMMENDED? Grains White bread. White pasta. White rice. Refined cornbread. Bagels and croissants. Crackers that contain trans fat. Vegetables Creamed or fried vegetables. Vegetables in a cheese sauce. Regular canned vegetables. Regular canned tomato sauce and paste. Regular tomato and vegetable juices. Fruits Dried fruits. Canned fruit in light or heavy syrup. Fruit juice. Meat and Other Protein Products Fatty cuts of meat. Ribs, chicken wings, bacon, sausage, bologna, salami, chitterlings, fatback, hot dogs, bratwurst, and packaged luncheon meats. Salted nuts and seeds. Canned beans with salt. Dairy Whole or 2% milk, cream, half-and-half, and cream cheese. Whole-fat or sweetened yogurt. Full-fat cheeses or blue cheese. Nondairy creamers and whipped toppings. Processed cheese, cheese spreads, or cheese curds. Condiments Onion and garlic salt,  seasoned salt, table salt, and sea salt. Canned and packaged gravies. Worcestershire sauce. Tartar sauce. Barbecue sauce. Teriyaki sauce. Soy sauce, including reduced sodium. Steak sauce. Fish sauce. Oyster sauce. Cocktail sauce. Horseradish. Ketchup and mustard. Meat flavorings and tenderizers. Bouillon cubes. Hot sauce. Tabasco sauce. Marinades. Taco seasonings. Relishes. Fats and Oils Butter, stick margarine, lard, shortening, ghee, and bacon fat. Coconut, palm kernel, or palm oils. Regular salad dressings. Other Pickles and olives. Salted popcorn and pretzels. The items listed above may not be a complete list of foods and beverages to avoid. Contact your dietitian for more information. WHERE CAN I FIND MORE INFORMATION? National Heart, Lung, and Blood Institute: travelstabloid.com Document Released: 07/18/2011 Document Revised: 12/13/2013 Document Reviewed: 06/02/2013 [facility] Patient Information 2015 [place], [place]. This information is not intended to replace advice given to you by your health care provider. Make sure you discuss any questions you have with your health care provider.

## 2019-08-03 LAB — COMPLETE METABOLIC PANEL WITH GFR
AG Ratio: 2 (calc) (ref 1.0–2.5)
ALT: 27 U/L (ref 6–29)
AST: 19 U/L (ref 10–35)
Albumin: 4.5 g/dL (ref 3.6–5.1)
Alkaline phosphatase (APISO): 49 U/L (ref 37–153)
BUN: 12 mg/dL (ref 7–25)
CO2: 32 mmol/L (ref 20–32)
Calcium: 9.7 mg/dL (ref 8.6–10.4)
Chloride: 105 mmol/L (ref 98–110)
Creat: 0.82 mg/dL (ref 0.50–0.99)
GFR, Est African American: 85 mL/min/{1.73_m2} (ref >=60)
GFR, Est Non African American: 73 mL/min/{1.73_m2} (ref >=60)
Globulin: 2.3 g/dL (calc) (ref 1.9–3.7)
Glucose, Bld: 110 mg/dL — ABNORMAL HIGH (ref 65–99)
Potassium: 4.1 mmol/L (ref 3.5–5.3)
Sodium: 143 mmol/L (ref 135–146)
Total Bilirubin: 0.7 mg/dL (ref 0.2–1.2)
Total Protein: 6.8 g/dL (ref 6.1–8.1)

## 2019-08-03 LAB — CBC WITH DIFFERENTIAL/PLATELET
Absolute Monocytes: 360 cells/uL (ref 200–950)
Basophils Absolute: 32 cells/uL (ref 0–200)
Basophils Relative: 0.6 %
Eosinophils Absolute: 207 cells/uL (ref 15–500)
Eosinophils Relative: 3.9 %
HCT: 38.6 % (ref 35.0–45.0)
Hemoglobin: 13 g/dL (ref 11.7–15.5)
Lymphs Abs: 2422 cells/uL (ref 850–3900)
MCH: 30.8 pg (ref 27.0–33.0)
MCHC: 33.7 g/dL (ref 32.0–36.0)
MCV: 91.5 fL (ref 80.0–100.0)
MPV: 9.6 fL (ref 7.5–12.5)
Monocytes Relative: 6.8 %
Neutro Abs: 2279 cells/uL (ref 1500–7800)
Neutrophils Relative %: 43 %
Platelets: 274 10*3/uL (ref 140–400)
RBC: 4.22 10*6/uL (ref 3.80–5.10)
RDW: 12.6 % (ref 11.0–15.0)
Total Lymphocyte: 45.7 %
WBC: 5.3 10*3/uL (ref 3.8–10.8)

## 2019-08-03 LAB — LIPID PANEL
Cholesterol: 176 mg/dL (ref <200)
HDL: 47 mg/dL — ABNORMAL LOW (ref >=50)
LDL Cholesterol (Calc): 99 mg/dL (calc)
Non-HDL Cholesterol (Calc): 129 mg/dL (calc) (ref <130)
Total CHOL/HDL Ratio: 3.7 (calc) (ref <5.0)
Triglycerides: 199 mg/dL — ABNORMAL HIGH (ref <150)

## 2019-08-03 LAB — TSH: TSH: 3.08 mIU/L (ref 0.40–4.50)

## 2019-08-03 LAB — VITAMIN D 25 HYDROXY (VIT D DEFICIENCY, FRACTURES): Vit D, 25-Hydroxy: 86 ng/mL (ref 30–100)

## 2019-08-16 ENCOUNTER — Telehealth: Payer: Self-pay | Admitting: *Deleted

## 2019-08-16 ENCOUNTER — Other Ambulatory Visit: Payer: Self-pay | Admitting: Internal Medicine

## 2019-08-16 MED ORDER — PROMETHAZINE-DM 6.25-15 MG/5ML PO SYRP: ORAL_SOLUTION | ORAL | 1 refills | Status: DC

## 2019-08-16 MED ORDER — DEXAMETHASONE 4 MG PO TABS: ORAL_TABLET | ORAL | 0 refills | Status: DC

## 2019-08-16 MED ORDER — AZITHROMYCIN 250 MG PO TABS: ORAL_TABLET | ORAL | 0 refills | Status: DC

## 2019-08-16 NOTE — Telephone Encounter (Signed)
Patient called and reported a positive Covid 19 test, performed at [facility]. She states she is having a dry cough, tightness in her chest with some shortness of breath. Dr Melford Aase, sent in RX's for a z-pak and Dexamethazone to her pharmacy.  She will continue to take Mucinex.

## 2019-08-16 NOTE — Telephone Encounter (Signed)
Patient called and reported, due to her history of colitis, she is unable to take any antibiotics.  She will not be taking the Z-pak Dr Melford Aase sent to her pharmacy.  Dr Melford Aase is aware.

## 2019-08-17 ENCOUNTER — Telehealth: Payer: Self-pay | Admitting: *Deleted

## 2019-08-17 DIAGNOSIS — U071 COVID-19: Secondary | ICD-10-CM | POA: Diagnosis not present

## 2019-08-17 DIAGNOSIS — R05 Cough: Secondary | ICD-10-CM | POA: Diagnosis not present

## 2019-08-17 DIAGNOSIS — R0602 Shortness of breath: Secondary | ICD-10-CM | POA: Diagnosis not present

## 2019-08-17 NOTE — Telephone Encounter (Signed)
Returned a call to patient. She states her O2 level has been as low as 84% and the highest is 90%.  She is very short of breath when speaking.  Per Dr Melford Aase, the patient should call 911 and be transported to the ED.Marland Kitchen

## 2019-08-17 NOTE — Telephone Encounter (Signed)
Patient called and reported she had called the ED and was told the wait time is 5 to 7 hours.  Patient asked if she can have oxygen delivered to her home.  Per Dr Melford Aase, the patient was advised she definitely needs to go to the ED, due to her low oxygen saturations. The patient reported she has called EMS and will agree to be transported.

## 2019-09-13 NOTE — Progress Notes (Signed)
Subjective:    Patient ID: Kathryn Lamb, female    DOB: [birth year], 69 y.o.   MRN: [id number]  HPI 69 y.o. WF that was positive COVID 01/05 presents with SOB.  She declined going to the ER, had decadron, zpak, mucinex and cough syrup.  She had temp x 2 night with COVID, rash, and was having a hard time breathing.  She states she has had thrush x 2 weeks, using magic mouth wash from her dentist.   She states now she feels she can not get a deep cough, feels like "something" in her chest. Her mom is in hospice at this time, she is having to help her transfer to and from the toilet, no chest pain or SOB during that time.   Blood pressure 126/80, pulse 95, temperature (!) 97.5 F (36.4 C), weight 140 lb (63.5 kg), SpO2 96 %.  Medications   Current Outpatient Medications (Cardiovascular):  .  atorvastatin (LIPITOR) 80 MG tablet, Take 1 tablet Daily for Cholesterol  Current Outpatient Medications (Respiratory):  .  cetirizine (ZYRTEC) 5 MG tablet, Take 5 mg by mouth daily.    Current Outpatient Medications (Other):  Marland Kitchen  buPROPion (WELLBUTRIN XL) 300 MG 24 hr tablet, Take 1 tablet every Morning for Mood, Focus & Concentration .  Flaxseed, Linseed, (FLAX SEED OIL) 1000 MG CAPS, Take 2,600 mg by mouth daily.  Marland Kitchen  gabapentin (NEURONTIN) 100 MG capsule, Take 2 capsules 3 x /day for Pain .  Multiple Vitamin (MULTIVITAMIN WITH MINERALS) TABS tablet, Take 1 tablet by mouth. Takes 3 times per week. M,W,F .  Omega-3 Fatty Acids (FISH OIL) 1000 MG CAPS, Take 3,000 mg by mouth daily. .  Probiotic Product (PROBIOTIC DAILY PO), Take by mouth daily. Marland Kitchen  VITAMIN D PO, Take 5,000 Units by mouth 2 (two) times daily.  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   Review of Systems  Constitutional: Positive for fatigue. Negative for chills and fever.  HENT: Positive for mouth sores (tongue pain). Negative for congestion, dental problem, ear discharge, ear pain, nosebleeds, rhinorrhea, sinus pressure, sore throat, trouble swallowing and voice change.   Respiratory: Positive for cough and chest tightness. Negative for shortness of breath and wheezing.   Cardiovascular: Negative.   Gastrointestinal: Negative.   Genitourinary: Negative.   Musculoskeletal: Negative.   Neurological: Negative.        Objective:   Physical Exam Constitutional:      Appearance: She is well-developed.  HENT:     Head: Normocephalic and atraumatic.     Right Ear: External ear normal.     Left Ear: External ear normal.     Mouth/Throat:     Comments: Slight depapillation of left tongue, smooth, slightly erythematous but no obvious oral lesions Eyes:     Conjunctiva/sclera: Conjunctivae normal.     Pupils: Pupils are equal, round, and reactive to light.  Neck:     Thyroid: No thyromegaly.  Cardiovascular:     Rate and Rhythm: Normal rate and regular rhythm.     Heart sounds: Normal heart sounds. No murmur. No friction rub. No gallop.   Pulmonary:     Effort: Pulmonary effort is normal. No respiratory distress.     Breath sounds: No stridor. Wheezing (anterior left chest) present. No rhonchi or  rales.  Abdominal:     General: Bowel sounds are normal. There is no distension.     Palpations: Abdomen is soft. There is no mass.     Tenderness: There is no abdominal tenderness. There is no guarding or rebound.  Musculoskeletal:        General: Normal range of motion.     Cervical back: Normal range of motion and neck supple.  Lymphadenopathy:     Cervical: No cervical adenopathy.  Skin:    General: Skin is warm and dry.  Neurological:     Mental Status: She is alert and oriented to person, place, and time.     Cranial Nerves: No cranial nerve  deficit.     Coordination: Coordination normal.     Deep Tendon Reflexes: Reflexes normal.           Assessment & Plan:   Dyspnea, unspecified type EKG WNL, no ST changes Check labs rule out anemia with glossitis, get on pepcid with recent steroid use, given sample of belresip -     CBC with Differential/Platelet -     COMPLETE METABOLIC PANEL WITH GFR -     TSH -     EKG 12-Lead -     DG Chest 2 View; Future  Glossitis Check for anemia, continue magic mouth wash, follow up with dentist.   Iron deficiency -     Iron,Total/Total Iron Binding Cap  History of COVID-19 -     SAR CoV2 Serology (COVID 19)AB(IGG)IA  Other orders -     mupirocin ointment (BACTROBAN) 2 %; Place 1 application into the nose 2 (two) times daily. -     famotidine (PEPCID) 40 MG tablet; Take 1 tablet (40 mg total) by mouth every evening.

## 2019-09-14 ENCOUNTER — Other Ambulatory Visit: Payer: Self-pay

## 2019-09-14 ENCOUNTER — Inpatient Hospital Stay: Payer: Medicare Other | Admitting: Adult Health Nurse Practitioner

## 2019-09-14 ENCOUNTER — Ambulatory Visit (INDEPENDENT_AMBULATORY_CARE_PROVIDER_SITE_OTHER): Payer: Medicare Other | Admitting: Physician Assistant

## 2019-09-14 ENCOUNTER — Encounter: Payer: Self-pay | Admitting: Physician Assistant

## 2019-09-14 VITALS — BP 126/80 | HR 95 | Temp 97.5°F | Wt 140.0 lb

## 2019-09-14 DIAGNOSIS — K140 Glossitis: Secondary | ICD-10-CM

## 2019-09-14 DIAGNOSIS — R0600 Dyspnea, unspecified: Secondary | ICD-10-CM

## 2019-09-14 DIAGNOSIS — Z8616 Personal history of COVID-19: Secondary | ICD-10-CM

## 2019-09-14 DIAGNOSIS — E611 Iron deficiency: Secondary | ICD-10-CM | POA: Diagnosis not present

## 2019-09-14 MED ORDER — MUPIROCIN 2 % EX OINT: 1.0000 "application " | TOPICAL_OINTMENT | Freq: Two times a day (BID) | CUTANEOUS | 0 refills | Status: DC

## 2019-09-14 MED ORDER — FAMOTIDINE 40 MG PO TABS: 40.0000 mg | ORAL_TABLET | Freq: Every evening | ORAL | 0 refills | Status: DC

## 2019-09-14 NOTE — Patient Instructions (Addendum)
INFORMATION ABOUT YOUR XRAY  Can walk into [address] for an Insurance account manager. They will have the order and take you back. You do not any paper work, I should get the result back today or tomorrow. This order is good for a year.  Can call [phone] to schedule an appointment if you wish.    INFORMATION ABOUT YOUR STEROID INHALER  Can do steroid inhaler, NEED TO DO DAILY, this is NOT a rescue inhaler so if you are acutely short of breath please use your albuterol or call 911.  Do 1 puff ONCE a day.  Do before you brush your teeth OR wash your mouth afterwards.  z IF YOU DO NOT [place] YOUR MOUTH OUT IT CAN CAUSE YEAST Can do 2 tsp vinegar with water and switch to help prevent yeast or help yeast in your mouth.   Go to the ER if any chest pain, shortness of breath, nausea, dizziness, severe HA, changes vision/speech   Go to the ER if any chest pain, shortness of breath, nausea, dizziness, severe HA, changes vision/speech  Take 2-4 weeks of pepcid at night for heart burn   Silent reflux: Not all heartburn burns...Marland KitchenMarland KitchenMarland Kitchen  What is LPR? Laryngopharyngeal reflux (LPR) or silent reflux is a condition in which acid that is made in the stomach travels up the esophagus (swallowing tube) and gets to the throat. Not everyone with reflux has a lot of heartburn or indigestion. In fact, many people with LPR never have heartburn. This is why LPR is called SILENT REFLUX, and the terms "Silent reflux" and "LPR" are often used interchangeably. Because LPR is silent, it is sometimes difficult to diagnose.  How can you tell if you have LPR?  Marland Kitchen Chronic hoarseness- Some people have hoarseness that comes and goes . throat clearing  . Cough . It can cause shortness of breath and cause asthma like symptoms. Marland Kitchen a feeling of a lump in the throat  . difficulty swallowing . a problem with too much nose and throat drainage.  . Some people will feel their esophagus spasm which feels like their heart beating hard  and fast, this will usually be after a meal, at rest, or lying down at night.    How do I treat this? Treatment for LPR should be individualized, and your doctor will suggest the best treatment for you. Generally there are several treatments for LPR: . changing habits and diet to reduce reflux,  . medications to reduce stomach acid, and  . surgery to prevent reflux. Most people with LPR need to modify how and when they eat, as well as take some medication, to get well. Sometimes, nonprescription liquid antacids, such as Maalox, Gelucil and Mylanta are recommended. When used, these antacids should be taken four times each day - one tablespoon one hour after each meal and before bedtime. Dietary and lifestyle changes alone are not often enough to control LPR - medications that reduce stomach acid are also usually needed. These must be prescribed by our doctor.   TIPS FOR REDUCING REFLUX AND LPR Control your LIFE-STYLE and your DIET! Marland Kitchen If you use tobacco, QUIT.  Marland Kitchen Smoking makes you reflux. After every cigarette you have some LPR.  . Don't wear clothing that is too tight, especially around the waist (trousers, corsets, belts).  . Do not lie down just after eating...in fact, do not eat within three hours of bedtime.  . You should be on a low-fat diet.  . Limit your intake of  red meat.  . Limit your intake of butter.  Marland Kitchen Avoid fried foods.  . Avoid chocolate  . Avoid cheese.  Marland Kitchen Avoid eggs. Marland Kitchen Specifically avoid caffeine (especially coffee and tea), soda pop (especially cola) and mints.  . Avoid alcoholic beverages, particularly in the evening.

## 2019-09-15 ENCOUNTER — Ambulatory Visit: Payer: Self-pay | Admitting: Physician Assistant

## 2019-09-15 ENCOUNTER — Encounter: Payer: Self-pay | Admitting: Physician Assistant

## 2019-09-15 LAB — COMPLETE METABOLIC PANEL WITH GFR
AG Ratio: 1.7 (calc) (ref 1.0–2.5)
ALT: 23 U/L (ref 6–29)
AST: 19 U/L (ref 10–35)
Albumin: 4.5 g/dL (ref 3.6–5.1)
Alkaline phosphatase (APISO): 55 U/L (ref 37–153)
BUN: 11 mg/dL (ref 7–25)
CO2: 30 mmol/L (ref 20–32)
Calcium: 10 mg/dL (ref 8.6–10.4)
Chloride: 104 mmol/L (ref 98–110)
Creat: 0.75 mg/dL (ref 0.60–0.93)
GFR, Est African American: 94 mL/min/{1.73_m2} (ref >=60)
GFR, Est Non African American: 81 mL/min/{1.73_m2} (ref >=60)
Globulin: 2.6 g/dL (calc) (ref 1.9–3.7)
Glucose, Bld: 86 mg/dL (ref 65–99)
Potassium: 4.5 mmol/L (ref 3.5–5.3)
Sodium: 143 mmol/L (ref 135–146)
Total Bilirubin: 0.5 mg/dL (ref 0.2–1.2)
Total Protein: 7.1 g/dL (ref 6.1–8.1)

## 2019-09-15 LAB — CBC WITH DIFFERENTIAL/PLATELET
Absolute Monocytes: 318 cells/uL (ref 200–950)
Basophils Absolute: 30 cells/uL (ref 0–200)
Basophils Relative: 0.7 %
Eosinophils Absolute: 172 cells/uL (ref 15–500)
Eosinophils Relative: 4 %
HCT: 36.7 % (ref 35.0–45.0)
Hemoglobin: 12.1 g/dL (ref 11.7–15.5)
Lymphs Abs: 1901 cells/uL (ref 850–3900)
MCH: 29.8 pg (ref 27.0–33.0)
MCHC: 33 g/dL (ref 32.0–36.0)
MCV: 90.4 fL (ref 80.0–100.0)
MPV: 9.2 fL (ref 7.5–12.5)
Monocytes Relative: 7.4 %
Neutro Abs: 1879 cells/uL (ref 1500–7800)
Neutrophils Relative %: 43.7 %
Platelets: 362 10*3/uL (ref 140–400)
RBC: 4.06 10*6/uL (ref 3.80–5.10)
RDW: 13 % (ref 11.0–15.0)
Total Lymphocyte: 44.2 %
WBC: 4.3 10*3/uL (ref 3.8–10.8)

## 2019-09-15 LAB — IRON, TOTAL/TOTAL IRON BINDING CAP
%SAT: 21 % (calc) (ref 16–45)
Iron: 74 ug/dL (ref 45–160)
TIBC: 352 mcg/dL (calc) (ref 250–450)

## 2019-09-15 LAB — SAR COV2 SEROLOGY (COVID19)AB(IGG),IA: SARS CoV2 AB IGG: POSITIVE — AB

## 2019-09-15 LAB — TSH: TSH: 3.65 mIU/L (ref 0.40–4.50)

## 2019-10-08 ENCOUNTER — Other Ambulatory Visit: Payer: Self-pay | Admitting: Physician Assistant

## 2019-11-01 NOTE — Progress Notes (Signed)
MEDICARE WELLNESS AND FOLLOW UP  Assessment:   Encounter for Medicare annual wellness exam  1 year  Recurrent major depressive disorder, in full remission ([place]) Remission with wellbutrin, continue medication  Former smoker -     [place]; Future -lung cancer screening with low dose CT discussed as recommended by guidelines based on age, number of pack year history.  Discussed risks of screening including but not limited to false positives on xray, further testing or consultation with specialist, and possible false negative CT as well. Understanding expressed and wishes to proceed with CT testing. Order placed.   Mixed hyperlipidemia -continue medications, check lipids, decrease fatty foods, increase activity.  - Lipid panel  Abnormal glucose Discussed general issues about diabetes pathophysiology and management., Educational material distributed., Suggested low cholesterol diet., Encouraged aerobic exercise., Discussed foot care., Reminded to get yearly retinal exam.  Depression, remission Depression- continue medications, stress management techniques discussed, increase water, good sleep hygiene discussed, increase exercise, and increase veggies.   Anxiety controlled   Rosacea Follows with Derm  Ischemic colitis Controlled, avoid triggers, continue follow up GI  Vitamin D deficiency Continue supplement  Medication management - Magnesium  Bilateral low back pain with sciatica, sciatica laterality unspecified Controlled, continue gabapentin PRN  Elevated blood pressure reading without diagnosis of hypertension - CBC with Differential/Platelet - BASIC METABOLIC PANEL WITH GFR - Hepatic function panel - TSH  Future Appointments  Date Time Provider [place]  05/29/2020 11:00 AM Unk Pinto, MD [facility] None      Plan:   During the course of the visit the patient was educated and counseled about appropriate  screening and preventive services including:    Pneumococcal vaccine   Prevnar 13  Influenza vaccine  Td vaccine  Screening electrocardiogram  Bone densitometry screening  Colorectal cancer screening  Diabetes screening  Glaucoma screening  Nutrition counseling   Advanced directives: requested   Over 30 minutes of exam, counseling, chart review, and critical decision making was performed Future Appointments  Date Time Provider [place]  05/29/2020 11:00 AM Unk Pinto, MD [facility] None      Subjective:   Kathryn Lamb is a 69 y.o. female who presents for wellness and 3 month follow up on hypertension, prediabetes, hyperlipidemia, vitamin D def.   She states she has some toe nail fungus and has an issues with the bottom of her feet, asking for podiatry referral.   Her mom is in hospice, her and her sister take turns with her.   She had COVID 01/05- she states she is not having any SOB, no CP, no cough.  She does not smoke, she quit 2010, 30 pack/year smoking history, last CXR was 2013.   Her blood pressure has been controlled at home, today their BP is BP: 136/80 She does workout. She denies chest pain, shortness of breath, dizziness.  She is on cholesterol medication, she increased to 80mg  last visit, and denies myalgias. Her cholesterol is at goal. The cholesterol last visit was:   Lab Results  Component Value Date   CHOL 176 08/02/2019   HDL 47 (L) 08/02/2019   LDLCALC 99 08/02/2019   TRIG 199 (H) 08/02/2019   CHOLHDL 3.7 08/02/2019   She has been working on diet and exercise for prediabetes, and denies paresthesia of the feet, polydipsia, polyuria and visual disturbances. Last A1C in the office was:  Lab Results  Component Value Date   HGBA1C 5.8 (H) 04/29/2019  Patient is on Vitamin D supplement. Lab Results  Component Value Date   VD25OH 93 08/02/2019     She is on wellbutrin XL 300mg  for depression and anxiety which helps, she  states that her memory is unchanged.  She is on zyrtec for allergies.  She has lower back pain with occ pain in her legs and is on gabapentin for this which helps.  BMI is Body mass index is 25.05 kg/m., she is working on diet and exercise. Wt Readings from Last 3 Encounters:  11/03/19 141 lb 6.4 oz (64.1 kg)  09/14/19 140 lb (63.5 kg)  08/02/19 144 lb (65.3 kg)    Medication Review   Current Outpatient Medications (Cardiovascular):  .  atorvastatin (LIPITOR) 80 MG tablet, Take 1 tablet Daily for Cholesterol  Current Outpatient Medications (Respiratory):  .  cetirizine (ZYRTEC) 5 MG tablet, Take 5 mg by mouth daily.    Current Outpatient Medications (Other):  Marland Kitchen  buPROPion (WELLBUTRIN XL) 300 MG 24 hr tablet, Take 1 tablet every Morning for Mood, Focus & Concentration .  famotidine (PEPCID) 40 MG tablet, Take 1 tablet daily for acid reflux and indigestion. .  gabapentin (NEURONTIN) 100 MG capsule, Take 2 capsules 3 x /day for Pain .  Multiple Vitamin (MULTIVITAMIN WITH MINERALS) TABS tablet, Take 1 tablet by mouth. Takes 3 times per week. M,W,F .  mupirocin ointment (BACTROBAN) 2 %, Place 1 application into the nose 2 (two) times daily. .  Probiotic Product (PROBIOTIC DAILY PO), Take by mouth daily. Marland Kitchen  VITAMIN D PO, Take 5,000 Units by mouth 2 (two) times daily.  Current Problems (verified) Patient Active Problem List   Diagnosis Date Noted  . COVID-19 08/17/2019  . Encounter for Medicare annual wellness exam 03/15/2016  . Hypertension 11/16/2015  . Vitamin D deficiency 11/16/2015  . Medication management 11/16/2015  . Lower back pain 01/10/2015  . Abnormal glucose 08/22/2014  . Mixed hyperlipidemia 10/05/2013  . Rosacea   . Anxiety   . 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[place])   . History of ischemic colitis     Screening Tests Immunization History  Administered Date(s) Administered  . Influenza Split 05/17/2013, 05/17/2014  . Influenza, High Dose Seasonal PF  05/09/2015, 06/18/2016, 05/30/2017, 06/01/2018, 08/02/2019  . Influenza-Unspecified 06/03/2018  . PPD Test 05/04/2014  . Pneumococcal Conjugate-13 05/09/2015  . Pneumococcal Polysaccharide-23 10/04/2013  . Td 08/30/2002  . Tdap 04/07/2012  . Varicella Zoster Immune Globulin 03/15/2016   Preventative care: Last colonoscopy: 12/2017- GI states for her to not have another- she needed peds scope and had a very difficult exam- will discuss other options in 10 years.  Last mammogram: 03/2019 Last pap smear/pelvic exam: 2014 has had + HPV, will need one more DEXA: 2020 normal T -1.0 CXR 2013 MRI lumbar spine 2007 MRI right shoulder 02/2017  Prior vaccinations: TD or Tdap: 2013  Influenza: 2018 Pneumococcal: 09/2013 Prevnar13: 2016 Shingles/Zostavax: 2017 Discussed shingrix, will consider  Names of Other Physician/Practitioners you currently use: 1. [facility]- here for primary care 2. Dr. Joya San, eye doctor, last visit 2020, glasses 3. Dr. Leonides Sake, dentist, last visit Jan 2020 Patient Care Team: Unk Pinto, MD as PCP - General (Internal Medicine) Richmond Campbell, MD as Consulting Physician (Gastroenterology) Newman Pies, MD as Consulting Physician (Neurosurgery) Allyn Kenner, MD as Consulting Physician (Dermatology)  Allergies Allergies  Allergen Reactions  . Doxycycline   . Erythromycin   . Flagyl [Metronidazole]   . Penicillins   . Sulfa Antibiotics  SURGICAL HISTORY She  has a past surgical history that includes Abdominal hysterectomy; Tubal ligation; Lumbar laminectomy (2000); Carpal tunnel release (Right, 2004); Appendectomy; and Microdiscectomy lumbar.   FAMILY HISTORY Her family history includes Alcohol abuse in her father; Cancer in her father; Diabetes in her father and sister; Heart disease in her mother; Hypertension in her mother; Stroke in her mother.   SOCIAL HISTORY She  reports that she quit smoking about  11 years ago. Her smoking use included cigarettes. She smoked 1.00 pack per day. She has never used smokeless tobacco. She reports that she does not drink alcohol or use drugs.  MEDICARE WELLNESS OBJECTIVES: Physical activity:   Cardiac risk factors:   Depression/mood screen:   Depression screen [facility] 2/9 04/28/2019  Decreased Interest 0  Down, Depressed, Hopeless 0  PHQ - 2 Score 0    ADLs:  In your present state of health, do you have any difficulty performing the following activities: 04/28/2019  Hearing? N  Vision? N  Difficulty concentrating or making decisions? N  Walking or climbing stairs? N  Dressing or bathing? N  Doing errands, shopping? N  Some recent data might be hidden     Cognitive Testing  Alert? Yes  Normal Appearance?Yes  Oriented to person? Yes  Place? Yes   Time? Yes  Recall of three objects?  Yes  Can perform simple calculations? Yes  Displays appropriate judgment?Yes  Can read the correct time from a watch face?Yes  EOL planning: Does Patient Have a Medical Advance Directive?: No Would patient like information on creating a medical advance directive?: Yes (MAU/Ambulatory/Procedural Areas - Information given)    Review of Systems  Constitutional: Negative.   HENT: Negative.   Eyes: Negative.   Respiratory: Negative.   Cardiovascular: Negative.   Gastrointestinal: Negative.   Genitourinary: Negative.   Musculoskeletal: Negative.   Skin: Negative.   Neurological: Negative.   Endo/Heme/Allergies: Negative.   Psychiatric/Behavioral: Negative.      Objective:   Today's Vitals   11/03/19 1337  BP: 136/80  Pulse: 71  Temp: (!) 97.5 F (36.4 C)  SpO2: 98%  Weight: 141 lb 6.4 oz (64.1 kg)  Height: 5\' 3"  (1.6 m)  PainSc: 3     General appearance: alert, no distress, WD/WN,  female HEENT: normocephalic, sclerae anicteric, TMs pearly, nares patent, no discharge or erythema, pharynx normal Oral cavity: MMM, no lesions Neck: supple, no  lymphadenopathy, no thyromegaly, no masses Heart: RRR, normal S1, S2, systolic murmur with radiation to carotids and best heard at RSB Lungs: CTA bilaterally, no wheezes, rhonchi, or rales Abdomen: +bs, soft, non tender, non distended, no masses, no hepatomegaly, no splenomegaly Musculoskeletal: nontender, no swelling, no obvious deformity Extremities: no edema, no cyanosis, no clubbing Pulses: 2+ symmetric, upper and lower extremities, normal cap refill Neurological: alert, oriented x 3, CN2-12 intact, strength normal upper extremities and lower extremities, sensation normal throughout, DTRs 2+ throughout, no cerebellar signs, gait normal Psychiatric: normal affect, behavior normal, pleasant  Breast: defer Gyn: defer Rectal: defer   Medicare Attestation I have personally reviewed: The patient's medical and social history Their use of alcohol, tobacco or illicit drugs Their current medications and supplements The patient's functional ability including ADLs,fall risks, home safety risks, cognitive, and hearing and visual impairment Diet and physical activities Evidence for depression or mood disorders  The patient's weight, height, BMI, and visual acuity have been recorded in the chart.  I have made referrals, counseling, and provided education to the patient based on  review of the above and I have provided the patient with a written personalized care plan for preventive services.     Vicie Mutters, PA-C   11/03/2019

## 2019-11-03 ENCOUNTER — Other Ambulatory Visit: Payer: Self-pay

## 2019-11-03 ENCOUNTER — Encounter: Payer: Self-pay | Admitting: Physician Assistant

## 2019-11-03 ENCOUNTER — Ambulatory Visit (INDEPENDENT_AMBULATORY_CARE_PROVIDER_SITE_OTHER): Payer: Medicare Other | Admitting: Physician Assistant

## 2019-11-03 VITALS — BP 136/80 | HR 71 | Temp 97.5°F | Ht 63.0 in | Wt 141.4 lb

## 2019-11-03 DIAGNOSIS — Z8719 Personal history of other diseases of the digestive system: Secondary | ICD-10-CM

## 2019-11-03 DIAGNOSIS — Z0001 Encounter for general adult medical examination with abnormal findings: Secondary | ICD-10-CM

## 2019-11-03 DIAGNOSIS — M5440 Lumbago with sciatica, unspecified side: Secondary | ICD-10-CM

## 2019-11-03 DIAGNOSIS — R7309 Other abnormal glucose: Secondary | ICD-10-CM | POA: Diagnosis not present

## 2019-11-03 DIAGNOSIS — E559 Vitamin D deficiency, unspecified: Secondary | ICD-10-CM

## 2019-11-03 DIAGNOSIS — R6889 Other general symptoms and signs: Secondary | ICD-10-CM

## 2019-11-03 DIAGNOSIS — F3342 Major depressive disorder, recurrent, in full remission: Secondary | ICD-10-CM

## 2019-11-03 DIAGNOSIS — I10 Essential (primary) hypertension: Secondary | ICD-10-CM | POA: Diagnosis not present

## 2019-11-03 DIAGNOSIS — Z87891 Personal history of nicotine dependence: Secondary | ICD-10-CM

## 2019-11-03 DIAGNOSIS — Z79899 Other long term (current) drug therapy: Secondary | ICD-10-CM

## 2019-11-03 DIAGNOSIS — M8580 Other specified disorders of bone density and structure, unspecified site: Secondary | ICD-10-CM

## 2019-11-03 DIAGNOSIS — L719 Rosacea, unspecified: Secondary | ICD-10-CM

## 2019-11-03 DIAGNOSIS — E782 Mixed hyperlipidemia: Secondary | ICD-10-CM

## 2019-11-03 DIAGNOSIS — F419 Anxiety disorder, unspecified: Secondary | ICD-10-CM | POA: Diagnosis not present

## 2019-11-03 DIAGNOSIS — Z0000 Encounter for general adult medical examination without abnormal findings: Secondary | ICD-10-CM

## 2019-11-03 NOTE — Patient Instructions (Addendum)
INFORMATION ABOUT YOUR XRAY  Can walk into [address] for an Insurance account manager. They will have the order and take you back. You do not any paper work, I should get the result back today or tomorrow. This order is good for a year.  Can call [phone] to schedule an appointment if you wish.   Aims to exercise AT least 10 mins a day, this helps circulate blood and in a study has decreased dementia risk.  Keep your mind engaged! Try meditation and relaxation techniques Do not smoke or drink alcohol If you have hearing loss consider getting hearing aids, studies have shown that wearing hearing aids can decrease dementia risk.   Ask insurance and pharmacy about shingrix - it is a 2 part shot that we will not be getting in the office.   Suggest getting AFTER covid vaccines, have to wait at least a month This shot can make you feel bad due to such good immune response it can trigger some inflammation so take tylenol or aleve day of or day after and plan on resting.   Can go to [email or web address] for more information  Shingrix Vaccination  Two vaccines are licensed and recommended to prevent shingles in the [place] Zoster vaccine live (ZVL, Zostavax) has been in use since 2006. Recombinant zoster vaccine (RZV, Shingrix), has been in use since 2017 and is recommended by ACIP as the preferred shingles vaccine.  What Everyone Should Know about Shingles Vaccine (Shingrix) One of the Recommended Vaccines by Disease Shingles vaccination is the only way to protect against shingles and postherpetic neuralgia (PHN), the most common complication from shingles. CDC recommends that healthy adults 50 years and older get two doses of the shingles vaccine called Shingrix (recombinant zoster vaccine), separated by 2 to 6 months, to prevent shingles and the complications from the disease. Your doctor or pharmacist can give you Shingrix as a shot in your upper  arm. Shingrix provides strong protection against shingles and PHN. Two doses of Shingrix is more than 90% effective at preventing shingles and PHN. Protection stays above 85% for at least the first four years after you get vaccinated. Shingrix is the preferred vaccine, over Zostavax (zoster vaccine live), a shingles vaccine in use since 2006. Zostavax may still be used to prevent shingles in healthy adults 60 years and older. For example, you could use Zostavax if a person is allergic to Shingrix, prefers Zostavax, or requests immediate vaccination and Shingrix is unavailable. Who Should Get Shingrix? Healthy adults 50 years and older should get two doses of Shingrix, separated by 2 to 6 months. You should get Shingrix even if in the past you . had shingles  . received Zostavax  . are not sure if you had chickenpox There is no maximum age for getting Shingrix. If you had shingles in the past, you can get Shingrix to help prevent future occurrences of the disease. There is no specific length of time that you need to wait after having shingles before you can receive Shingrix, but generally you should make sure the shingles rash has gone away before getting vaccinated. You can get Shingrix whether or not you remember having had chickenpox in the past. Studies show that more than 99% of Americans 40 years and older have had chickenpox, even if they don't remember having the disease. Chickenpox and shingles are related because they are caused by the same virus (varicella zoster virus). After a person recovers from chickenpox, the virus stays dormant (inactive) in the  body. It can reactivate years later and cause shingles. If you had Zostavax in the recent past, you should wait at least eight weeks before getting Shingrix. Talk to your healthcare provider to determine the best time to get Shingrix. Shingrix is available in [employer] and pharmacies. To find doctor's offices or pharmacies near you that  offer the vaccine, visit [employer] Vaccine FinderExternal. If you have questions about Shingrix, talk with your healthcare provider. Vaccine for Those 48 Years and Older  Shingrix reduces the risk of shingles and PHN by more than 90% in people 21 and older. CDC recommends the vaccine for healthy adults 38 and older.  Who Should Not Get Shingrix? You should not get Shingrix if you: . have ever had a severe allergic reaction to any component of the vaccine or after a dose of Shingrix  . tested negative for immunity to varicella zoster virus. If you test negative, you should get chickenpox vaccine.  . currently have shingles  . currently are pregnant or breastfeeding. Women who are pregnant or breastfeeding should wait to get Shingrix.  Marland Kitchen receive specific antiviral drugs (acyclovir, famciclovir, or valacyclovir) 24 hours before vaccination (avoid use of these antiviral drugs for 14 days after vaccination)- zoster vaccine live only If you have a minor acute (starts suddenly) illness, such as a cold, you may get Shingrix. But if you have a moderate or severe acute illness, you should usually wait until you recover before getting the vaccine. This includes anyone with a temperature of 101.84F or higher. The side effects of the Shingrix are temporary, and usually last 2 to 3 days. While you may experience pain for a few days after getting Shingrix, the pain will be less severe than having shingles and the complications from the disease. How Well Does Shingrix Work? Two doses of Shingrix provides strong protection against shingles and postherpetic neuralgia (PHN), the most common complication of shingles. . In adults 53 to 70 years old who got two doses, Shingrix was 97% effective in preventing shingles; among adults 70 years and older, Shingrix was 91% effective.  . In adults 71 to 70 years old who got two doses, Shingrix was 91% effective in preventing PHN; among adults 70 years and older, Shingrix was  89% effective. Shingrix protection remained high (more than 85%) in people 70 years and older throughout the four years following vaccination. Since your risk of shingles and PHN increases as you get older, it is important to have strong protection against shingles in your older years. Top of Page  What Are the Possible Side Effects of Shingrix? Studies show that Shingrix is safe. The vaccine helps your body create a strong defense against shingles. As a result, you are likely to have temporary side effects from getting the shots. The side effects may affect your ability to do normal daily activities for 2 to 3 days. Most people got a sore arm with mild or moderate pain after getting Shingrix, and some also had redness and swelling where they got the shot. Some people felt tired, had muscle pain, a headache, shivering, fever, stomach pain, or nausea. About 1 out of 6 people who got Shingrix experienced side effects that prevented them from doing regular activities. Symptoms went away on their own in about 2 to 3 days. Side effects were more common in younger people. You might have a reaction to the first or second dose of Shingrix, or both doses. If you experience side effects, you may choose to take  over-the-counter pain medicine such as ibuprofen or acetaminophen. If you experience side effects from Shingrix, you should report them to the Vaccine Adverse Event Reporting System (VAERS). Your doctor might file this report, or you can do it yourself through the VAERS websiteExternal, or by calling [phone]. If you have any questions about side effects from Shingrix, talk with your doctor. The shingles vaccine does not contain thimerosal (a preservative containing mercury). Top of Page  When Should I See a Doctor Because of the Side Effects I Experience From Shingrix? In clinical trials, Shingrix was not associated with serious adverse events. In fact, serious side effects from vaccines are extremely  rare. For example, for every 1 million doses of a vaccine given, only one or two people may have a severe allergic reaction. Signs of an allergic reaction happen within minutes or hours after vaccination and include hives, swelling of the face and throat, difficulty breathing, a fast heartbeat, dizziness, or weakness. If you experience these or any other life-threatening symptoms, see a doctor right away. Shingrix causes a strong response in your immune system, so it may produce short-term side effects more intense than you are used to from other vaccines. These side effects can be uncomfortable, but they are expected and usually go away on their own in 2 or 3 days. Top of Page  How Can I Pay For Shingrix? There are several ways shingles vaccine may be paid for: Medicare . Medicare Part D plans cover the shingles vaccine, but there may be a cost to you depending on your plan. There may be a copay for the vaccine, or you may need to pay in full then get reimbursed for a certain amount.  . Medicare Part B does not cover the shingles vaccine. Medicaid . Medicaid may or may not cover the vaccine. Contact your insurer to find out. Private health insurance . Many private health insurance plans will cover the vaccine, but there may be a cost to you depending on your plan. Contact your insurer to find out. Vaccine assistance programs . Some pharmaceutical companies provide vaccines to eligible adults who cannot afford them. You may want to check with the vaccine manufacturer, [employer], about Shingrix. If you do not currently have health insurance, learn more about affordable health coverage optionsExternal. To find doctor's offices or pharmacies near you that offer the vaccine, visit HealthMap Vaccine FinderExternal.

## 2019-11-04 LAB — CBC WITH DIFFERENTIAL/PLATELET
Absolute Monocytes: 400 cells/uL (ref 200–950)
Basophils Absolute: 31 cells/uL (ref 0–200)
Basophils Relative: 0.6 %
Eosinophils Absolute: 192 cells/uL (ref 15–500)
Eosinophils Relative: 3.7 %
HCT: 38.4 % (ref 35.0–45.0)
Hemoglobin: 12.7 g/dL (ref 11.7–15.5)
Lymphs Abs: 2491 cells/uL (ref 850–3900)
MCH: 30.1 pg (ref 27.0–33.0)
MCHC: 33.1 g/dL (ref 32.0–36.0)
MCV: 91 fL (ref 80.0–100.0)
MPV: 9.8 fL (ref 7.5–12.5)
Monocytes Relative: 7.7 %
Neutro Abs: 2085 cells/uL (ref 1500–7800)
Neutrophils Relative %: 40.1 %
Platelets: 278 10*3/uL (ref 140–400)
RBC: 4.22 10*6/uL (ref 3.80–5.10)
RDW: 12.9 % (ref 11.0–15.0)
Total Lymphocyte: 47.9 %
WBC: 5.2 10*3/uL (ref 3.8–10.8)

## 2019-11-04 LAB — COMPLETE METABOLIC PANEL WITH GFR
AG Ratio: 2 (calc) (ref 1.0–2.5)
ALT: 27 U/L (ref 6–29)
AST: 23 U/L (ref 10–35)
Albumin: 4.7 g/dL (ref 3.6–5.1)
Alkaline phosphatase (APISO): 55 U/L (ref 37–153)
BUN: 12 mg/dL (ref 7–25)
CO2: 30 mmol/L (ref 20–32)
Calcium: 10 mg/dL (ref 8.6–10.4)
Chloride: 104 mmol/L (ref 98–110)
Creat: 0.77 mg/dL (ref 0.60–0.93)
GFR, Est African American: 91 mL/min/{1.73_m2} (ref >=60)
GFR, Est Non African American: 78 mL/min/{1.73_m2} (ref >=60)
Globulin: 2.3 g/dL (calc) (ref 1.9–3.7)
Glucose, Bld: 86 mg/dL (ref 65–99)
Potassium: 4.5 mmol/L (ref 3.5–5.3)
Sodium: 141 mmol/L (ref 135–146)
Total Bilirubin: 0.8 mg/dL (ref 0.2–1.2)
Total Protein: 7 g/dL (ref 6.1–8.1)

## 2019-11-04 LAB — HEMOGLOBIN A1C
Hgb A1c MFr Bld: 5.7 % of total Hgb — ABNORMAL HIGH (ref <5.7)
Mean Plasma Glucose: 117 (calc)
eAG (mmol/L): 6.5 (calc)

## 2019-11-04 LAB — LIPID PANEL
Cholesterol: 187 mg/dL (ref <200)
HDL: 53 mg/dL (ref >=50)
LDL Cholesterol (Calc): 108 mg/dL (calc) — ABNORMAL HIGH
Non-HDL Cholesterol (Calc): 134 mg/dL (calc) — ABNORMAL HIGH (ref <130)
Total CHOL/HDL Ratio: 3.5 (calc) (ref <5.0)
Triglycerides: 149 mg/dL (ref <150)

## 2019-11-04 LAB — TSH: TSH: 3.02 mIU/L (ref 0.40–4.50)

## 2020-01-07 ENCOUNTER — Other Ambulatory Visit: Payer: Self-pay

## 2020-01-07 ENCOUNTER — Ambulatory Visit
Admission: RE | Admit: 2020-01-07 | Discharge: 2020-01-07 | Disposition: A | Discharge status: Home / Self Care | Payer: Medicare Other | Source: Ambulatory Visit | Attending: Physician Assistant | Admitting: Physician Assistant

## 2020-01-07 DIAGNOSIS — Z87891 Personal history of nicotine dependence: Secondary | ICD-10-CM | POA: Diagnosis not present

## 2020-01-11 ENCOUNTER — Encounter: Payer: Self-pay | Admitting: Physician Assistant

## 2020-01-11 DIAGNOSIS — I700 Atherosclerosis of aorta: Secondary | ICD-10-CM | POA: Insufficient documentation

## 2020-01-11 DIAGNOSIS — J439 Emphysema, unspecified: Secondary | ICD-10-CM | POA: Insufficient documentation

## 2020-01-18 ENCOUNTER — Other Ambulatory Visit: Payer: Self-pay

## 2020-01-18 ENCOUNTER — Encounter: Payer: Self-pay | Admitting: Orthopedic Surgery

## 2020-01-18 ENCOUNTER — Ambulatory Visit: Payer: Medicare Other | Admitting: Orthopedic Surgery

## 2020-01-18 ENCOUNTER — Ambulatory Visit: Payer: Self-pay

## 2020-01-18 VITALS — Ht 63.0 in | Wt 140.0 lb

## 2020-01-18 DIAGNOSIS — M25811 Other specified joint disorders, right shoulder: Secondary | ICD-10-CM

## 2020-01-18 DIAGNOSIS — G5602 Carpal tunnel syndrome, left upper limb: Secondary | ICD-10-CM | POA: Insufficient documentation

## 2020-01-18 DIAGNOSIS — M25511 Pain in right shoulder: Secondary | ICD-10-CM | POA: Diagnosis not present

## 2020-01-18 DIAGNOSIS — G8929 Other chronic pain: Secondary | ICD-10-CM

## 2020-01-18 DIAGNOSIS — M19011 Primary osteoarthritis, right shoulder: Secondary | ICD-10-CM

## 2020-01-18 DIAGNOSIS — M7541 Impingement syndrome of right shoulder: Secondary | ICD-10-CM

## 2020-01-18 MED ORDER — BUPIVACAINE HCL 0.5 % IJ SOLN
2.0000 mL | INTRAMUSCULAR | Status: AC | PRN
  Administered 2020-01-18: 2 mL via INTRA_ARTICULAR

## 2020-01-18 MED ORDER — LIDOCAINE HCL 1 % IJ SOLN
2.0000 mL | INTRAMUSCULAR | Status: AC | PRN
  Administered 2020-01-18: 2 mL

## 2020-01-18 MED ORDER — METHYLPREDNISOLONE ACETATE 40 MG/ML IJ SUSP
40.0000 mg | INTRAMUSCULAR | Status: AC | PRN
  Administered 2020-01-18: 40 mg via INTRA_ARTICULAR

## 2020-01-18 NOTE — Progress Notes (Signed)
Office Visit Note   Patient: Kathryn Lamb           Date of Birth: Nov 29, 1950           MRN: [id number] Visit Date: 01/18/2020              Requested by: Unk Pinto, [address] [place],  [place] [id number] PCP: Unk Pinto, MD   Assessment & Plan: Visit Diagnoses:  1. Chronic right shoulder pain   2. Impingement syndrome of right shoulder   3. Osteoarthritis of right AC (acromioclavicular) joint   4. Os acromiale of right shoulder   5. Carpal tunnel syndrome, left upper limb     Plan:  #1: The AC joint was injected and we waited a short period of time.  She still had pain relative to the shoulder but that certainly had improvement in the [facility] joint.  At that time I chose to do a subacromial injection to the right shoulder which actually did relieve her of most of her pain. #2: If she continues to have recurrence or continuing pain she will give [place] a call and we will schedule her for an MRI scan of the right shoulder.  This will be mainly to see if there has been degradation of the rotator cuff to that of a full-thickness tear.  Follow-Up Instructions: No follow-ups on file.   Face-to-face time spent with patient was greater than 30 minutes.  Greater than 50% of the time was spent in counseling and coordination of care.  Orders:  Orders Placed This Encounter  Procedures  . Large Joint Inj  . XR Shoulder Right   No orders of the defined types were placed in this encounter.     Procedures: Large Joint Inj: R subacromial bursa on 01/18/2020 2:23 PM Indications: pain and diagnostic evaluation Details: 25 G 1.5 in needle, anterolateral approach  Arthrogram: No  Medications: 2 mL bupivacaine 0.5 %; 2 mL lidocaine 1 %; 40 mg methylPREDNISolone acetate 40 MG/ML Consent was given by the patient. Immediately prior to procedure a time out was called to verify the correct patient, procedure, equipment, support staff and site/side marked as required.  Patient was prepped and draped in the usual sterile fashion.    AC Joint also injected    Clinical Data: No additional findings.   Subjective: Chief Complaint  Patient presents with  . Right Shoulder - Pain  Patient presents today for right shoulder pain. She was here last in 2018 and received a cortisone injection and had an MRI. She has had recurrent pain in her shoulder since taking care of her mother and lifting her in march and April. She states that it hurts all throughout her shoulder and radiates into her upper arm. No numbness, tingling, or weakness in her arm. She has tried the Voltaren Gel and taken Ibuprofen if needed. She is right hand dominant.  She is also having numbness in her left hand. She states that one day she had a "streak" of pain, but not since then. Her numbness is throughout her entire hand. She states that she cannot grip well. She had carpal tunnel release in the right hand 17years ago.   HPI  Review of Systems  Constitutional: Negative for fatigue.  HENT: Negative for ear pain.   Eyes: Negative for pain.  Respiratory: Negative for shortness of breath.   Cardiovascular: Negative for leg swelling.  Gastrointestinal: Negative for constipation and diarrhea.  Endocrine: Negative for cold intolerance  and heat intolerance.  Genitourinary: Negative for difficulty urinating.  Musculoskeletal: Negative for joint swelling.  Skin: Negative for rash.  Allergic/Immunologic: Negative for food allergies.  Neurological: Negative for weakness.  Hematological: Does not bruise/bleed easily.  Psychiatric/Behavioral: Negative for sleep disturbance.     Objective: Vital Signs: Ht 5\' 3"  (1.6 m)   Wt 140 lb (63.5 kg)   BMI 24.80 kg/m   Physical Exam Constitutional:      Appearance: She is well-developed.  Eyes:     Pupils: Pupils are equal, round, and reactive to light.  Pulmonary:     Effort: Pulmonary effort is normal.  Skin:    General: Skin is warm and  dry.  Neurological:     Mental Status: She is alert and oriented to person, place, and time.  Psychiatric:        Behavior: Behavior normal.     Ortho Exam  Exam today reveals a positive empty can test.  She has around 135 degrees of forward flexion abduction.  With the arm at 90 degrees of abduction she has around the 80 degrees of external rotation and with internal rotation can actually touch her thumb to her spine.  She has good strength in the right shoulder.  Mildly positive empty can test.  AC is tender to palpation and there appears to be 2 areas of that can be palpated.  She does have a positive Phalen's test on the left hand.  Specialty Comments:  No specialty comments available.  Imaging: No results found.   PMFS History: Current Outpatient Medications  Medication Sig Dispense Refill  . atorvastatin (LIPITOR) 80 MG tablet Take 1 tablet Daily for Cholesterol 90 tablet 3  . buPROPion (WELLBUTRIN XL) 300 MG 24 hr tablet Take 1 tablet every Morning for Mood, Focus & Concentration 90 tablet 3  . cetirizine (ZYRTEC) 5 MG tablet Take 5 mg by mouth daily.    . famotidine (PEPCID) 40 MG tablet Take 1 tablet daily for acid reflux and indigestion. 90 tablet 1  . gabapentin (NEURONTIN) 100 MG capsule Take 2 capsules 3 x /day for Pain 540 capsule 3  . Multiple Vitamin (MULTIVITAMIN WITH MINERALS) TABS tablet Take 1 tablet by mouth. Takes 3 times per week. M,W,F    . mupirocin ointment (BACTROBAN) 2 % Place 1 application into the nose 2 (two) times daily. 22 g 0  . Probiotic Product (PROBIOTIC DAILY PO) Take by mouth daily.    Marland Kitchen VITAMIN D PO Take 5,000 Units by mouth 2 (two) times daily.     No current facility-administered medications for this visit.    Patient Active Problem List   Diagnosis Date Noted  . Impingement syndrome of right shoulder 01/18/2020  . Osteoarthritis of right AC (acromioclavicular) joint 01/18/2020  . Os acromiale of right shoulder 01/18/2020  . Carpal  tunnel syndrome, left upper limb 01/18/2020  . Aortic atherosclerosis ([place]) 01/11/2020  . Emphysema lung ([place]) 01/11/2020  . COVID-19 08/17/2019  . Encounter for Medicare annual wellness exam 03/15/2016  . Hypertension 11/16/2015  . Vitamin D deficiency 11/16/2015  . Medication management 11/16/2015  . Lower back pain 01/10/2015  . Abnormal glucose 08/22/2014  . Mixed hyperlipidemia 10/05/2013  . Rosacea   . Anxiety   . 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[place])   . History of ischemic colitis    Past Medical History:  Diagnosis Date  . Anxiety   . Depression   . Diverticulitis   . Ischemic colitis ([place])   .  Mixed hyperlipidemia 10/05/2013  . Rosacea     Family History  Problem Relation Age of Onset  . Diabetes Father   . Alcohol abuse Father   . Cancer Father        prostate, lung  . Hypertension Mother   . Stroke Mother   . Heart disease Mother   . Diabetes Sister     Past Surgical History:  Procedure Laterality Date  . ABDOMINAL HYSTERECTOMY    . APPENDECTOMY    . CARPAL TUNNEL RELEASE Right 2004  . LUMBAR LAMINECTOMY  2000  . MICRODISCECTOMY LUMBAR    . TUBAL LIGATION     Social History   Occupational History  . Not on file  Tobacco Use  . Smoking status: Former Smoker    Packs/day: 1.00    Years: 30.00    Pack years: 30.00    Types: Cigarettes    Quit date: 10/04/2008    Years since quitting: 11.2  . Smokeless tobacco: Never Used  Substance and Sexual Activity  . Alcohol use: No  . Drug use: No  . Sexual activity: Not on file

## 2020-02-11 NOTE — Progress Notes (Signed)
FOLLOW UP  Assessment:   Recurrent major depressive disorder, in full remission ([place]) Remission with wellbutrin, continue medication  Mixed hyperlipidemia -continue medications, check lipids, decrease fatty foods, increase activity.  - Lipid panel  Abnormal glucose Discussed general issues about diabetes pathophysiology and management., Educational material distributed., Suggested low cholesterol diet., Encouraged aerobic exercise., Discussed foot care., Reminded to get yearly retinal exam.  Depression, remission Depression- continue medications, stress management techniques discussed, increase water, good sleep hygiene discussed, increase exercise, and increase veggies.   Elevated blood pressure reading without diagnosis of hypertension - CBC with Differential/Platelet - BASIC METABOLIC PANEL WITH GFR - Hepatic function panel - TSH  Pulmonary emphysema, unspecified emphysema type (HCC) No symptoms at this time Did discuss pulmonary rehab  Aortic atherosclerosis ([place]) See on CT scan Continue diet No symptoms- long discussion that there is plaque but the CT does not tell us how much or if it is affecting her function Dicussed pulmonary/cardio rehab referral versus stress test referral but without symptoms will just do aggressive medical management and lifestyle modification.   History of COVID-19 -     SAR CoV2 Serology (COVID 19)AB(IGG)IA - she is willing to get the vaccine but wants to wait  Over 30 minutes of exam, counseling, chart review, and critical decision making was performed Future Appointments  Date Time Provider [place]  05/29/2020 11:00 AM Unk Pinto, MD [facility] None      Subjective:   Kathryn Lamb is a 69 y.o. female who presents for 4  month follow up on hypertension, prediabetes, hyperlipidemia, vitamin D def.   Her mom passed away 12/17/2019, has been working on cleaning out her house with her sister.    She does not smoke, she  quit 2010, 30 pack/year smoking history, low dose CT scan 01/07/2020. She states she has no coughing, wheezing- some coughing with weather, no wheezing. No CP with exertion.   1. Lung-RADS 2, benign appearance or behavior. Continue annual screening with low-dose chest CT without contrast in 12 months. 2. Aortic atherosclerosis (ICD10-I70.0), coronary artery atherosclerosis and emphysema (ICD10-J43.9).  Her blood pressure has been controlled at home, today their BP is BP: 122/68 She does workout. She denies chest pain, shortness of breath, dizziness.  She is on cholesterol medication, she increased to 80mg  last visit, and denies myalgias. Her cholesterol is at goal. The cholesterol last visit was:   Lab Results  Component Value Date   CHOL 187 11/03/2019   HDL 53 11/03/2019   LDLCALC 108 (H) 11/03/2019   TRIG 149 11/03/2019   CHOLHDL 3.5 11/03/2019   She has been working on diet and exercise for prediabetes, and denies paresthesia of the feet, polydipsia, polyuria and visual disturbances. Last A1C in the office was:  Lab Results  Component Value Date   HGBA1C 5.7 (H) 11/03/2019   Patient is on Vitamin D supplement. Lab Results  Component Value Date   VD25OH 25 08/02/2019     She is on wellbutrin XL 300mg  for depression and anxiety which helps, she states that her memory is unchanged.  She is on zyrtec for allergies.  She has lower back pain with occ pain in her legs and is on gabapentin for this which helps.   Medication Review   Current Outpatient Medications (Cardiovascular):  .  atorvastatin (LIPITOR) 80 MG tablet, Take 1 tablet Daily for Cholesterol  Current Outpatient Medications (Respiratory):  .  cetirizine (ZYRTEC) 5 MG tablet, Take 5 mg by mouth daily.  Current Outpatient Medications (Other):  Marland Kitchen  buPROPion (WELLBUTRIN XL) 300 MG 24 hr tablet, Take 1 tablet every Morning for Mood, Focus & Concentration .  famotidine (PEPCID) 40 MG tablet, Take 1 tablet daily for  acid reflux and indigestion. .  Flaxseed, Linseed, (FLAXSEED OIL PO), Take 1,300 mg by mouth. .  gabapentin (NEURONTIN) 100 MG capsule, Take 2 capsules 3 x /day for Pain .  Multiple Vitamin (MULTIVITAMIN WITH MINERALS) TABS tablet, Take 1 tablet by mouth. Takes 3 times per week. M,W,F .  mupirocin ointment (BACTROBAN) 2 %, Place 1 application into the nose 2 (two) times daily. .  Omega-3 1000 MG CAPS, Take 1,200 mg by mouth. .  Probiotic Product (PROBIOTIC DAILY PO), Take by mouth daily. Marland Kitchen  VITAMIN D PO, Take 5,000 Units by mouth 2 (two) times daily.  Current Problems (verified) Patient Active Problem List   Diagnosis Date Noted  . Impingement syndrome of right shoulder 01/18/2020  . Osteoarthritis of right AC (acromioclavicular) joint 01/18/2020  . Os acromiale of right shoulder 01/18/2020  . Carpal tunnel syndrome, left upper limb 01/18/2020  . Aortic atherosclerosis ([place]) 01/11/2020  . Emphysema lung ([place]) 01/11/2020  . COVID-19 08/17/2019  . Encounter for Medicare annual wellness exam 03/15/2016  . Hypertension 11/16/2015  . Vitamin D deficiency 11/16/2015  . Medication management 11/16/2015  . Lower back pain 01/10/2015  . Abnormal glucose 08/22/2014  . Mixed hyperlipidemia 10/05/2013  . Rosacea   . Anxiety   . 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[place])   . History of ischemic colitis    Allergies Allergies  Allergen Reactions  . Doxycycline   . Erythromycin   . Flagyl [Metronidazole]   . Penicillins   . Sulfa Antibiotics     SURGICAL HISTORY She  has a past surgical history that includes Abdominal hysterectomy; Tubal ligation; Lumbar laminectomy (2000); Carpal tunnel release (Right, 2004); Appendectomy; and Microdiscectomy lumbar.   FAMILY HISTORY Her family history includes Alcohol abuse in her father; Cancer in her father; Diabetes in her father and sister; Heart disease in her mother; Hypertension in her mother; Stroke in her mother.   SOCIAL HISTORY She   reports that she quit smoking about 11 years ago. Her smoking use included cigarettes. She has a 30.00 pack-year smoking history. She has never used smokeless tobacco. She reports that she does not drink alcohol and does not use drugs.    Review of Systems  Constitutional: Negative.   HENT: Negative.   Eyes: Negative.   Respiratory: Negative.   Cardiovascular: Negative.   Gastrointestinal: Negative.   Genitourinary: Negative.   Musculoskeletal: Negative.   Skin: Negative.   Neurological: Negative.   Endo/Heme/Allergies: Negative.   Psychiatric/Behavioral: Negative.      Objective:   Today's Vitals   02/15/20 1110  BP: 122/68  Pulse: 77  Temp: (!) 97 F (36.1 C)  SpO2: 99%  Weight: 144 lb (65.3 kg)    General appearance: alert, no distress, WD/WN,  female HEENT: normocephalic, sclerae anicteric, TMs pearly, nares patent, no discharge or erythema, pharynx normal Oral cavity: MMM, no lesions Neck: supple, no lymphadenopathy, no thyromegaly, no masses Heart: RRR, normal S1, S2, systolic murmur with radiation to carotids and best heard at RSB Lungs: CTA bilaterally, no wheezes, rhonchi, or rales Abdomen: +bs, soft, non tender, non distended, no masses, no hepatomegaly, no splenomegaly Musculoskeletal: nontender, no swelling, no obvious deformity Extremities: no edema, no cyanosis, no clubbing Pulses: 2+ symmetric, upper and lower extremities, normal cap refill Neurological:  alert, oriented x 3, CN2-12 intact, strength normal upper extremities and lower extremities, sensation normal throughout, DTRs 2+ throughout, no cerebellar signs, gait normal Psychiatric: normal affect, behavior normal, pleasant   Vicie Mutters, PA-C   02/15/2020

## 2020-02-15 ENCOUNTER — Encounter: Payer: Self-pay | Admitting: Physician Assistant

## 2020-02-15 ENCOUNTER — Ambulatory Visit (INDEPENDENT_AMBULATORY_CARE_PROVIDER_SITE_OTHER): Payer: Medicare Other | Admitting: Physician Assistant

## 2020-02-15 ENCOUNTER — Other Ambulatory Visit: Payer: Self-pay

## 2020-02-15 VITALS — BP 122/68 | HR 77 | Temp 97.0°F | Wt 144.0 lb

## 2020-02-15 DIAGNOSIS — R7309 Other abnormal glucose: Secondary | ICD-10-CM | POA: Diagnosis not present

## 2020-02-15 DIAGNOSIS — I10 Essential (primary) hypertension: Secondary | ICD-10-CM | POA: Diagnosis not present

## 2020-02-15 DIAGNOSIS — F3342 Major depressive disorder, recurrent, in full remission: Secondary | ICD-10-CM

## 2020-02-15 DIAGNOSIS — Z8616 Personal history of COVID-19: Secondary | ICD-10-CM | POA: Diagnosis not present

## 2020-02-15 DIAGNOSIS — E782 Mixed hyperlipidemia: Secondary | ICD-10-CM

## 2020-02-15 DIAGNOSIS — I700 Atherosclerosis of aorta: Secondary | ICD-10-CM

## 2020-02-15 DIAGNOSIS — E559 Vitamin D deficiency, unspecified: Secondary | ICD-10-CM

## 2020-02-15 DIAGNOSIS — J439 Emphysema, unspecified: Secondary | ICD-10-CM | POA: Diagnosis not present

## 2020-02-15 NOTE — Patient Instructions (Addendum)
We can get a plain old stress test on you since you are not having symptoms Or we can refer to pulmonary cardiac rehab to do exercise program in controlled setting.    Go to the ER if any chest pain, shortness of breath, nausea, dizziness, severe HA, changes vision/speech   WOMEN AND HEART ATTACKS  Being a woman you may not have the typical symptoms of a heart attack.  You may not have any pain OR you may have atypical pain such as jaw pain, upper back pain, arm pain, "my bra feels to tight" and you will often have symptoms with it like below.  Symptoms for a heart attack will likely occur when you exert your self or exercise and include: Shortness of breath Sweating Nausea Dizziness Fast or irregular heart beats Fatigue   It makes me feel better if my patients get their heart rate up with exercise once or twice a week and pay close attention to your body. If there is ANY change in your exercise capacity or if you have symptoms above, please STOP and call 911 or call to come to the office.   Here is some information to help you keep your heart healthy: Move it! - Aim for 30 mins of activity every day. Take it slowly at first. Talk to [place] before starting any new exercise program.   Lose it.  -Body Mass Index (BMI) can indicate if you need to lose weight. A healthy range is 18.5-24.9. For a BMI calculator, go to [employer]  Waist Management -Excess abdominal fat is a risk factor for heart disease, diabetes, asthma, stroke and more. Ideal waist circumference is less than 35" for women and less than 40" for men.   Eat Right -focus on fruits, vegetables, whole grains, and meals you make yourself. Avoid foods with trans fat and high sugar/sodium content.   Snooze or Snore? - Loud snoring can be a sign of sleep apnea, a significant risk factor for high blood pressure, heart attach, stroke, and heart arrhythmias.  Kick the habit -Quit Smoking! Avoid second hand smoke. A single  cigarette raises your blood pressure for 20 mins and increases the risk of heart attack and stroke for the next 24 hours.   Are Aspirin and Supplements right for you? -Add ENTERIC COATED low dose 81 mg Aspirin daily OR can do every other day if you have easy bruising to protect your heart and head. As well as to reduce risk of Colon Cancer by 20 %, Skin Cancer by 26 % , Melanoma by 46% and Pancreatic cancer by 60%  Say "No to Stress -There may be little you can do about problems that cause stress. However, techniques such as long walks, meditation, and exercise can help you manage it.   Start Now! - Make changes one at a time and set reasonable goals to increase your likelihood of success.     Atherosclerosis  Atherosclerosis is narrowing and hardening of the arteries. Arteries are blood vessels that carry blood from the heart to all parts of the body. This blood contains oxygen. Arteries can become narrow or clogged with a buildup of fat, cholesterol, calcium, and other substances (plaque). Plaque decreases the amount of blood that can flow through the artery. Atherosclerosis can affect any artery in the body, including:  Heart arteries (coronary artery disease). This may cause a heart attack.  Brain arteries. This may cause a stroke (cerebrovascular accident).  Leg, arm, and pelvis arteries (peripheral artery disease). This  may cause pain and numbness.  Kidney arteries. This may cause kidney (renal) failure. Treatment may slow the disease and prevent further damage to the heart, brain, peripheral arteries, and kidneys. What are the causes? Atherosclerosis develops slowly over many years. The inner layers of your arteries become damaged and allow the gradual buildup of plaque. The exact cause of atherosclerosis is not fully understood. Symptoms of atherosclerosis do not occur until the artery becomes narrow or blocked. What increases the risk? The following factors may make you more  likely to develop this condition:  High blood pressure.  High cholesterol.  Being middle-aged or older.  Having a family history of atherosclerosis.  Having high blood fats (triglycerides).  Diabetes.  Being overweight.  Smoking tobacco.  Not exercising enough (sedentary lifestyle).  Having a substance in the blood called C-reactive protein (CRP). This is a sign of increased levels of inflammation in the body.  Sleep apnea.  Being stressed.  Drinking too much alcohol. What are the signs or symptoms? This condition may not cause any symptoms. If you have symptoms, they are caused by damage to an area of your body that is not getting enough blood.  Coronary artery disease may cause chest pain and shortness of breath.  Decreased blood supply to your brain may cause a stroke. Signs of a stroke may include sudden: ? Weakness on one side of the body. ? Confusion. ? Changes in vision. ? Inability to speak or understand speech. ? Loss of balance, coordination, or the ability to walk. ? Severe headache. ? Loss of consciousness.  Peripheral arterial disease may cause pain and numbness, often in the legs and hips.  Renal failure may cause fatigue, nausea, swelling, and itchy skin. How is this diagnosed? This condition is diagnosed based on your medical history and a physical exam. During the exam:  Your health care provider will: ? Check your pulse in different places. ? Listen for a "whooshing" sound over your arteries (bruit).  You may have tests, such as: ? Blood tests to check your levels of cholesterol, triglycerides, and CRP. ? Electrocardiogram (ECG) to check for heart damage. ? Chest X-ray to see if you have an enlarged heart, which is a sign of heart failure. ? Stress test to see how your heart reacts to exercise. ? Echocardiogram to get images of the inside of your heart. ? Ankle-brachial index to compare blood pressure in your arms to blood pressure in your  ankles. ? Ultrasound of your peripheral arteries to check blood flow. ? CT scan to check for damage to your heart or brain. ? X-rays of blood vessels after dye has been injected (angiogram) to check blood flow. How is this treated? Treatment starts with lifestyle changes, which may include:  Changing your diet.  Losing weight.  Reducing stress.  Exercising and being physically active more regularly.  Not smoking. You may also need medicine to:  Lower triglycerides and cholesterol.  Control blood pressure.  Prevent blood clots.  Lower inflammation in your body.  Control your blood sugar. Sometimes, surgery is needed to:  Remove plaque from an artery (endarterectomy).  Open or widen a narrowed heart artery (angioplasty).  Create a new path for your blood with one of these procedures: ? Heart (coronary) artery bypass graft surgery. ? Peripheral artery bypass graft surgery. Follow these instructions at home: Eating and drinking   Eat a heart-healthy diet. Talk with your health care provider or a diet and nutrition specialist (dietitian) if you need  help. A heart-healthy diet involves: ? Limiting unhealthy fats and increasing healthy fats. Some examples of healthy fats are olive oil and canola oil. ? Eating plant-based foods, such as fruits, vegetables, nuts, whole grains, and legumes (such as peas and lentils).  Limit alcohol intake to no more than 1 drink a day for nonpregnant women and 2 drinks a day for men. One drink equals 12 oz of beer, 5 oz of wine, or 1 oz of hard liquor. Lifestyle  Follow an exercise program as told by your health care provider.  Maintain a healthy weight. Lose weight if your health care provider says that you need to do that.  Rest when you are tired.  Learn to manage your stress.  Do not use any products that contain nicotine or tobacco, such as cigarettes and e-cigarettes. If you need help quitting, ask your health care provider.  Do  not abuse drugs. General instructions  Take over-the-counter and prescription medicines only as told by your health care provider.  Manage other health conditions as told by your health care provider.  Keep all follow-up visits as told by your health care provider. This is important. Contact a health care provider if:  You have chest pain or discomfort. This includes squeezing chest pain that may feel like indigestion (angina).  You have shortness of breath.  You have an irregular heartbeat.  You have unexplained fatigue.  You have unexplained pain or numbness in an arm, leg, or hip.  You have nausea, swelling of your hands or feet, and itchy skin. Get help right away if:  You have any symptoms of a heart attack, such as: ? Chest pain. ? Shortness of breath. ? Pain in your neck, jaw, arms, back, or stomach. ? Cold sweat. ? Nausea. ? Light-headedness.  You have any symptoms of a stroke. "BE FAST" is an easy way to remember the main warning signs of a stroke: ? B - Balance. Signs are dizziness, sudden trouble walking, or loss of balance. ? E - Eyes. Signs are trouble seeing or a sudden change in vision. ? F - Face. Signs are sudden weakness or numbness of the face, or the face or eyelid drooping on one side. ? A - Arms. Signs are weakness or numbness in an arm. This happens suddenly and usually on one side of the body. ? S - Speech. Signs are sudden trouble speaking, slurred speech, or trouble understanding what people say. ? T - Time. Time to call emergency services. Write down what time symptoms started.  You have other signs of a stroke, such as: ? A sudden, severe headache with no known cause. ? Nausea or vomiting. ? Seizure. These symptoms may represent a serious problem that is an emergency. Do not wait to see if the symptoms will go away. Get medical help right away. Call your local emergency services (911 in the [place] Do not drive yourself to the  hospital. Summary  Atherosclerosis is narrowing and hardening of the arteries.  Arteries can become narrow or clogged with a buildup of fat, cholesterol, calcium, and other substances (plaque).  This condition may not cause any symptoms. If you do have symptoms, they are caused by damage to an area of your body that is not getting enough blood.  Treatment may include lifestyle changes and medicines. In some cases, surgery is needed. This information is not intended to replace advice given to you by your health care provider. Make sure you discuss any questions you have  with your health care provider. Document Revised: 11/07/2017 Document Reviewed: 04/03/2017 Elsevier Patient Education  [place].

## 2020-02-16 LAB — COMPLETE METABOLIC PANEL WITH GFR
AG Ratio: 2.1 (calc) (ref 1.0–2.5)
ALT: 24 U/L (ref 6–29)
AST: 18 U/L (ref 10–35)
Albumin: 4.7 g/dL (ref 3.6–5.1)
Alkaline phosphatase (APISO): 60 U/L (ref 37–153)
BUN: 12 mg/dL (ref 7–25)
CO2: 30 mmol/L (ref 20–32)
Calcium: 9.9 mg/dL (ref 8.6–10.4)
Chloride: 106 mmol/L (ref 98–110)
Creat: 0.73 mg/dL (ref 0.60–0.93)
GFR, Est African American: 97 mL/min/{1.73_m2} (ref >=60)
GFR, Est Non African American: 83 mL/min/{1.73_m2} (ref >=60)
Globulin: 2.2 g/dL (calc) (ref 1.9–3.7)
Glucose, Bld: 100 mg/dL — ABNORMAL HIGH (ref 65–99)
Potassium: 4.8 mmol/L (ref 3.5–5.3)
Sodium: 141 mmol/L (ref 135–146)
Total Bilirubin: 0.8 mg/dL (ref 0.2–1.2)
Total Protein: 6.9 g/dL (ref 6.1–8.1)

## 2020-02-16 LAB — CBC WITH DIFFERENTIAL/PLATELET
Absolute Monocytes: 262 cells/uL (ref 200–950)
Basophils Absolute: 22 cells/uL (ref 0–200)
Basophils Relative: 0.5 %
Eosinophils Absolute: 159 cells/uL (ref 15–500)
Eosinophils Relative: 3.7 %
HCT: 39.8 % (ref 35.0–45.0)
Hemoglobin: 13.5 g/dL (ref 11.7–15.5)
Lymphs Abs: 1935 cells/uL (ref 850–3900)
MCH: 30.9 pg (ref 27.0–33.0)
MCHC: 33.9 g/dL (ref 32.0–36.0)
MCV: 91.1 fL (ref 80.0–100.0)
MPV: 9.5 fL (ref 7.5–12.5)
Monocytes Relative: 6.1 %
Neutro Abs: 1922 cells/uL (ref 1500–7800)
Neutrophils Relative %: 44.7 %
Platelets: 255 10*3/uL (ref 140–400)
RBC: 4.37 10*6/uL (ref 3.80–5.10)
RDW: 12.6 % (ref 11.0–15.0)
Total Lymphocyte: 45 %
WBC: 4.3 10*3/uL (ref 3.8–10.8)

## 2020-02-16 LAB — LIPID PANEL
Cholesterol: 198 mg/dL (ref <200)
HDL: 53 mg/dL (ref >=50)
LDL Cholesterol (Calc): 118 mg/dL (calc) — ABNORMAL HIGH
Non-HDL Cholesterol (Calc): 145 mg/dL (calc) — ABNORMAL HIGH (ref <130)
Total CHOL/HDL Ratio: 3.7 (calc) (ref <5.0)
Triglycerides: 152 mg/dL — ABNORMAL HIGH (ref <150)

## 2020-02-16 LAB — TSH: TSH: 3.21 mIU/L (ref 0.40–4.50)

## 2020-02-16 LAB — HEMOGLOBIN A1C
Hgb A1c MFr Bld: 5.8 % of total Hgb — ABNORMAL HIGH (ref <5.7)
Mean Plasma Glucose: 120 (calc)
eAG (mmol/L): 6.6 (calc)

## 2020-02-16 LAB — SARS-COV-2 ANTIBODY(IGG)SPIKE,SEMI-QUANTITATIVE: SARS COV1 AB(IGG)SPIKE,SEMI QN: 6.2 index — ABNORMAL HIGH (ref <1.00)

## 2020-03-31 ENCOUNTER — Other Ambulatory Visit: Payer: Self-pay | Admitting: Internal Medicine

## 2020-05-09 ENCOUNTER — Other Ambulatory Visit: Payer: Self-pay | Admitting: Internal Medicine

## 2020-05-09 DIAGNOSIS — Z1231 Encounter for screening mammogram for malignant neoplasm of breast: Secondary | ICD-10-CM

## 2020-05-26 ENCOUNTER — Encounter: Payer: Medicare Other | Admitting: Internal Medicine

## 2020-05-28 ENCOUNTER — Encounter: Payer: Self-pay | Admitting: Internal Medicine

## 2020-05-28 DIAGNOSIS — R0989 Other specified symptoms and signs involving the circulatory and respiratory systems: Secondary | ICD-10-CM | POA: Insufficient documentation

## 2020-05-28 NOTE — Progress Notes (Signed)
Annual Screening/Preventative Visit & Comprehensive Evaluation &  Examination     This very nice 69 y.o.   MWF presents for a Screening /Preventative Visit & comprehensive evaluation and management of multiple medical co-morbidities.  Patient has been followed for HTN, HLD, Prediabetes  and Vitamin D Deficiency. Patient has Aortic Atherosclerosis by CT scan in June 2021. Patient has GERD controlled on her meds. She also follows with Dr Earlean Shawl for IBS.       Patient has been followed for labile HTN. Patient's BP has been controlled at home and patient denies any cardiac symptoms as chest pain, palpitations, shortness of breath, dizziness or ankle swelling. Today's BP was elevated at 142/90. She reports home BP's are always in the 120's/80's.        Patient's hyperlipidemia is not  controlled with diet and Atorvastatin. Patient denies myalgias or other medication SE's. Last lipids were not at goal:  Lab Results  Component Value Date   CHOL 198 02/15/2020   HDL 53 02/15/2020   LDLCALC 118 (H) 02/15/2020   TRIG 152 (H) 02/15/2020   CHOLHDL 3.7 02/15/2020       Patient has hx/o prediabetes (A1c 5.7% /2016) and patient denies reactive hypoglycemic symptoms, visual blurring, diabetic polys or paresthesias. Last A1c was not at goal:  Lab Results  Component Value Date   HGBA1C 5.8 (H) 02/15/2020       Finally, patient has history of Vitamin D Deficiency  ("41" /2019)  and last Vitamin D was at goal:  Lab Results  Component Value Date   VD25OH 86 08/02/2019    Current Outpatient Medications on File Prior to Visit  Medication Sig  . aspirin EC 81 MG tablet Take 81 mg by mouth daily. Swallow whole.  Marland Kitchen atorvastatin (LIPITOR) 80 MG tablet Take 1 tablet Daily for Cholesterol  . buPROPion (WELLBUTRIN XL) 300 MG 24 hr tablet Take 1 tablet every Morning for Mood, Focus & Concentration  . cetirizine (ZYRTEC) 10 MG tablet Take 10 mg by mouth daily.  . cyanocobalamin 1000 MCG tablet Take 1,000  mcg by mouth daily.  . famotidine (PEPCID) 40 MG tablet Take 1 tablet daily for acid reflux and indigestion.  . Flaxseed, Linseed, (FLAXSEED OIL PO) Take 1,300 mg by mouth.  . gabapentin (NEURONTIN) 100 MG capsule Take 2 capsules    3 x /day    for Pain  . Multiple Vitamin (MULTIVITAMIN WITH MINERALS) TABS tablet Take 1 tablet by mouth. Takes 3 times per week. M,W,F  . Omega-3 1000 MG CAPS Take 1,200 mg by mouth.  . Probiotic Product (PROBIOTIC DAILY PO) Take by mouth daily.  Marland Kitchen VITAMIN D PO Take 5,000 Units by mouth 2 (two) times daily.    Allergies  Allergen Reactions  . Doxycycline   . Erythromycin   . Flagyl [Metronidazole]   . Penicillins   . Sulfa Antibiotics    Past Medical History:  Diagnosis Date  . Anxiety   . Depression   . Diverticulitis   . Ischemic colitis ([place])   . Mixed hyperlipidemia 10/05/2013  . Rosacea    Health Maintenance  Topic Date Due  . COVID-19 Vaccine (1) Never done  . INFLUENZA VACCINE  03/12/2020  . MAMMOGRAM  03/23/2020  . TETANUS/TDAP  04/07/2022  . COLONOSCOPY  12/16/2027  . DEXA SCAN  Completed  . Hepatitis C Screening  Completed  . PNA vac Low Risk Adult  Discontinued   Immunization History  Administered Date(s) Administered  . Influenza Split  05/17/2013, 05/17/2014  . Influenza, High Dose Seasonal PF 05/09/2015, 06/18/2016, 05/30/2017, 06/01/2018, 08/02/2019  . Influenza-Unspecified 06/03/2018  . PPD Test 05/04/2014  . Pneumococcal Conjugate-13 05/09/2015  . Pneumococcal Polysaccharide-23 10/04/2013  . Td 08/30/2002  . Tdap 04/07/2012  . Varicella Zoster Immune Globulin 03/15/2016    Last Colon - 12/15/2017 - Dr Earlean Shawl - Mod / severe Diverticulosis- Recc 10 yr f/u due May 2029  Last MGM - 03/24/2019 - scheduled 07/10/2020  Last BMD - 03/24/2019 - Osteopenia Lt Femur  T-  1.5  Past Surgical History:  Procedure Laterality Date  . ABDOMINAL HYSTERECTOMY    . APPENDECTOMY    . CARPAL TUNNEL RELEASE Right 2004  . LUMBAR  LAMINECTOMY  2000  . MICRODISCECTOMY LUMBAR    . TUBAL LIGATION     Family History  Problem Relation Age of Onset  . Diabetes Father   . Alcohol abuse Father   . Cancer Father        prostate, lung  . Hypertension Mother   . Stroke Mother   . Heart disease Mother   . Diabetes Sister    Social History   Tobacco Use  . Smoking status: Former Smoker    Packs/day: 1.00    Years: 30.00    Pack years: 30.00    Types: Cigarettes    Quit date: 10/04/2008    Years since quitting: 11.6  . Smokeless tobacco: Never Used  Vaping Use  . Vaping Use: Never used  Substance Use Topics  . Alcohol use: No  . Drug use: No    ROS Constitutional: Denies fever, chills, weight loss/gain, headaches, insomnia,  night sweats, and change in appetite. Does c/o fatigue. Eyes: Denies redness, blurred vision, diplopia, discharge, itchy, watery eyes.  ENT: Denies discharge, congestion, post nasal drip, epistaxis, sore throat, earache, hearing loss, dental pain, Tinnitus, Vertigo, Sinus pain, snoring.  Cardio: Denies chest pain, palpitations, irregular heartbeat, syncope, dyspnea, diaphoresis, orthopnea, PND, claudication, edema Respiratory: denies cough, dyspnea, DOE, pleurisy, hoarseness, laryngitis, wheezing.  Gastrointestinal: Denies dysphagia, heartburn, reflux, water brash, pain, cramps, nausea, vomiting, bloating, diarrhea, constipation, hematemesis, melena, hematochezia, jaundice, hemorrhoids Genitourinary: Denies dysuria, frequency, urgency, nocturia, hesitancy, discharge, hematuria, flank pain Breast: Breast lumps, nipple discharge, bleeding.  Musculoskeletal: Denies arthralgia, myalgia, stiffness, Jt. Swelling, pain, limp, and strain/sprain. Denies falls. Skin: Denies puritis, rash, hives, warts, acne, eczema, changing in skin lesion Neuro: No weakness, tremor, incoordination, spasms, paresthesia, pain Psychiatric: Denies confusion, memory loss, sensory loss. Denies Depression. Endocrine: Denies  change in weight, skin, hair change, nocturia, and paresthesia, diabetic polys, visual blurring, hyper / hypo glycemic episodes.  Heme/Lymph: No excessive bleeding, bruising, enlarged lymph nodes.  Physical Exam  BP (!) 142/90   Pulse 63   Temp (!) 97 F (36.1 C)   Resp 16   Ht 5\' 3"  (1.6 m)   Wt 144 lb 9.6 oz (65.6 kg)   SpO2 94%   BMI 25.61 kg/m   General Appearance: Well nourished, well groomed and in no apparent distress.  Eyes: PERRLA, EOMs, conjunctiva no swelling or erythema, normal fundi and vessels. Sinuses: No frontal/maxillary tenderness ENT/Mouth: EACs patent / TMs  nl. Nares clear without erythema, swelling, mucoid exudates. Oral hygiene is good. No erythema, swelling, or exudate. Tongue normal, non-obstructing. Tonsils not swollen or erythematous. Hearing normal.  Neck: Supple, thyroid not palpable. No bruits, nodes or JVD. Respiratory: Respiratory effort normal.  BS equal and clear bilateral without rales, rhonci, wheezing or stridor. Cardio: Heart sounds are normal with regular  rate and rhythm and no murmurs, rubs or gallops. Peripheral pulses are normal and equal bilaterally without edema. No aortic or femoral bruits. Chest: symmetric with normal excursions and percussion. Breasts: Deferred at patient request to upcoming MGM. Abdomen: Flat, soft with bowel sounds active. Nontender, no guarding, rebound, hernias, masses, or organomegaly.  Lymphatics: Non tender without lymphadenopathy.  Musculoskeletal: Full ROM all peripheral extremities, joint stability, 5/5 strength, and normal gait. Skin: Warm and dry without rashes, lesions, cyanosis, clubbing or  ecchymosis.  Neuro: Cranial nerves intact, reflexes equal bilaterally. Normal muscle tone, no cerebellar symptoms. Sensation intact.  Pysch: Alert and oriented X 3, normal affect, Insight and Judgment appropriate.   Assessment and Plan  1. Annual Preventative Screening Examination    2. Labile hypertension  - EKG  12-Lead - Urinalysis, Routine w reflex microscopic - Microalbumin / creatinine urine ratio - CBC with Differential/Platelet - COMPLETE METABOLIC PANEL WITH GFR - Magnesium - TSH  3. Hyperlipidemia, mixed  - EKG 12-Lead - Lipid panel - TSH  4. Abnormal glucose  - EKG 12-Lead - Hemoglobin A1c - Insulin, random  5. Vitamin D deficiency  - VITAMIN D 25 Hydroxy   6. Aortic atherosclerosis (HCC)  - EKG 12-Lead  7. Screening for colorectal cancer  - POC Hemoccult Bld/Stl   8. Screening for ischemic heart disease  - EKG 12-Lead  9. FHx: heart disease  - EKG 12-Lead  10. History of ischemic colitis  - EKG 12-Lead  11. Medication management  - Urinalysis, Routine w reflex microscopic - Microalbumin / creatinine urine ratio - CBC with Differential/Platelet - COMPLETE METABOLIC PANEL WITH GFR - Magnesium - Lipid panel - TSH - Hemoglobin A1c - Insulin, random - VITAMIN D 25 Hydroxy         Patient was counseled in prudent diet to achieve/maintain BMI less than 25 for weight control, BP monitoring, regular exercise and medications. Discussed med's effects and SE's. Screening labs and tests as requested with regular follow-up as recommended. Over 40 minutes of exam, counseling, chart review and high complex critical decision making was performed.   Kirtland Bouchard, MD

## 2020-05-28 NOTE — Patient Instructions (Signed)

## 2020-05-29 ENCOUNTER — Ambulatory Visit (INDEPENDENT_AMBULATORY_CARE_PROVIDER_SITE_OTHER): Payer: Medicare Other | Admitting: Internal Medicine

## 2020-05-29 ENCOUNTER — Other Ambulatory Visit: Payer: Self-pay

## 2020-05-29 VITALS — BP 142/90 | HR 63 | Temp 97.0°F | Resp 16 | Ht 63.0 in | Wt 144.6 lb

## 2020-05-29 DIAGNOSIS — E782 Mixed hyperlipidemia: Secondary | ICD-10-CM | POA: Diagnosis not present

## 2020-05-29 DIAGNOSIS — R7309 Other abnormal glucose: Secondary | ICD-10-CM

## 2020-05-29 DIAGNOSIS — Z8249 Family history of ischemic heart disease and other diseases of the circulatory system: Secondary | ICD-10-CM

## 2020-05-29 DIAGNOSIS — E559 Vitamin D deficiency, unspecified: Secondary | ICD-10-CM | POA: Diagnosis not present

## 2020-05-29 DIAGNOSIS — Z136 Encounter for screening for cardiovascular disorders: Secondary | ICD-10-CM | POA: Diagnosis not present

## 2020-05-29 DIAGNOSIS — Z1211 Encounter for screening for malignant neoplasm of colon: Secondary | ICD-10-CM

## 2020-05-29 DIAGNOSIS — Z8719 Personal history of other diseases of the digestive system: Secondary | ICD-10-CM

## 2020-05-29 DIAGNOSIS — R0989 Other specified symptoms and signs involving the circulatory and respiratory systems: Secondary | ICD-10-CM

## 2020-05-29 DIAGNOSIS — Z79899 Other long term (current) drug therapy: Secondary | ICD-10-CM

## 2020-05-29 DIAGNOSIS — Z0001 Encounter for general adult medical examination with abnormal findings: Secondary | ICD-10-CM

## 2020-05-29 DIAGNOSIS — Z0000 Encounter for general adult medical examination without abnormal findings: Secondary | ICD-10-CM | POA: Diagnosis not present

## 2020-05-29 DIAGNOSIS — I700 Atherosclerosis of aorta: Secondary | ICD-10-CM

## 2020-05-30 LAB — CBC WITH DIFFERENTIAL/PLATELET
Absolute Monocytes: 302 cells/uL (ref 200–950)
Basophils Absolute: 32 cells/uL (ref 0–200)
Basophils Relative: 0.7 %
Eosinophils Absolute: 230 cells/uL (ref 15–500)
Eosinophils Relative: 5.1 %
HCT: 38.6 % (ref 35.0–45.0)
Hemoglobin: 12.7 g/dL (ref 11.7–15.5)
Lymphs Abs: 2259 cells/uL (ref 850–3900)
MCH: 30 pg (ref 27.0–33.0)
MCHC: 32.9 g/dL (ref 32.0–36.0)
MCV: 91 fL (ref 80.0–100.0)
MPV: 9.7 fL (ref 7.5–12.5)
Monocytes Relative: 6.7 %
Neutro Abs: 1679 cells/uL (ref 1500–7800)
Neutrophils Relative %: 37.3 %
Platelets: 244 10*3/uL (ref 140–400)
RBC: 4.24 10*6/uL (ref 3.80–5.10)
RDW: 12.2 % (ref 11.0–15.0)
Total Lymphocyte: 50.2 %
WBC: 4.5 10*3/uL (ref 3.8–10.8)

## 2020-05-30 LAB — HEMOGLOBIN A1C
Hgb A1c MFr Bld: 5.6 % of total Hgb (ref <5.7)
Mean Plasma Glucose: 114 (calc)
eAG (mmol/L): 6.3 (calc)

## 2020-05-30 LAB — COMPLETE METABOLIC PANEL WITH GFR
AG Ratio: 1.8 (calc) (ref 1.0–2.5)
ALT: 24 U/L (ref 6–29)
AST: 19 U/L (ref 10–35)
Albumin: 4.4 g/dL (ref 3.6–5.1)
Alkaline phosphatase (APISO): 53 U/L (ref 37–153)
BUN: 11 mg/dL (ref 7–25)
CO2: 29 mmol/L (ref 20–32)
Calcium: 9.7 mg/dL (ref 8.6–10.4)
Chloride: 104 mmol/L (ref 98–110)
Creat: 0.78 mg/dL (ref 0.60–0.93)
GFR, Est African American: 89 mL/min/{1.73_m2} (ref >=60)
GFR, Est Non African American: 77 mL/min/{1.73_m2} (ref >=60)
Globulin: 2.4 g/dL (calc) (ref 1.9–3.7)
Glucose, Bld: 95 mg/dL (ref 65–99)
Potassium: 4.6 mmol/L (ref 3.5–5.3)
Sodium: 140 mmol/L (ref 135–146)
Total Bilirubin: 0.6 mg/dL (ref 0.2–1.2)
Total Protein: 6.8 g/dL (ref 6.1–8.1)

## 2020-05-30 LAB — MICROALBUMIN / CREATININE URINE RATIO
Creatinine, Urine: 16 mg/dL — ABNORMAL LOW (ref 20–275)
Microalb, Ur: <0.2 mg/dL

## 2020-05-30 LAB — URINALYSIS, ROUTINE W REFLEX MICROSCOPIC
Bacteria, UA: NONE SEEN /HPF
Bilirubin Urine: NEGATIVE
Glucose, UA: NEGATIVE
Hgb urine dipstick: NEGATIVE
Hyaline Cast: NONE SEEN /LPF
Ketones, ur: NEGATIVE
Nitrite: NEGATIVE
Protein, ur: NEGATIVE
RBC / HPF: NONE SEEN /HPF (ref 0–2)
Specific Gravity, Urine: 1.005 (ref 1.001–1.03)
Squamous Epithelial / HPF: NONE SEEN /HPF (ref <=5)
WBC, UA: NONE SEEN /HPF (ref 0–5)
pH: 7 (ref 5.0–8.0)

## 2020-05-30 LAB — LIPID PANEL
Cholesterol: 160 mg/dL (ref <200)
HDL: 48 mg/dL — ABNORMAL LOW (ref >=50)
LDL Cholesterol (Calc): 90 mg/dL (calc)
Non-HDL Cholesterol (Calc): 112 mg/dL (calc) (ref <130)
Total CHOL/HDL Ratio: 3.3 (calc) (ref <5.0)
Triglycerides: 128 mg/dL (ref <150)

## 2020-05-30 LAB — INSULIN, RANDOM: Insulin: 11 u[IU]/mL

## 2020-05-30 LAB — MAGNESIUM: Magnesium: 2.1 mg/dL (ref 1.5–2.5)

## 2020-05-30 LAB — TSH: TSH: 3.41 mIU/L (ref 0.40–4.50)

## 2020-05-30 LAB — VITAMIN D 25 HYDROXY (VIT D DEFICIENCY, FRACTURES): Vit D, 25-Hydroxy: 129 ng/mL — ABNORMAL HIGH (ref 30–100)

## 2020-05-30 NOTE — Progress Notes (Signed)
========================================================== -   Test results slightly outside the reference range are not unusual. If there is anything important, I will review this with you,  otherwise it is considered normal test values.  If you have further questions,  please do not hesitate to contact me at the office or via My Chart.  ==========================================================  -  A1c = 5.6% - Great - back to Normal ! ==========================================================  -  Vitamin D = 129 - too High - Stop Vitamin D for 5 days &   restart taking only 1 capsule of Vitamin D 5,000 units  /day  ==========================================================  -  All Else - CBC - Kidneys - Electrolytes - Liver - Magnesium & Thyroid    - all  Normal / OK ==========================================================   - Keep up the great work  ! ==========================================================

## 2020-06-20 ENCOUNTER — Other Ambulatory Visit: Payer: Self-pay | Admitting: Internal Medicine

## 2020-07-10 ENCOUNTER — Ambulatory Visit
Admission: RE | Admit: 2020-07-10 | Discharge: 2020-07-10 | Disposition: A | Discharge status: Home / Self Care | Payer: Medicare Other | Source: Ambulatory Visit | Attending: Internal Medicine | Admitting: Internal Medicine

## 2020-07-10 ENCOUNTER — Other Ambulatory Visit: Payer: Self-pay

## 2020-07-10 DIAGNOSIS — Z1231 Encounter for screening mammogram for malignant neoplasm of breast: Secondary | ICD-10-CM

## 2020-07-25 ENCOUNTER — Other Ambulatory Visit: Payer: Self-pay | Admitting: Internal Medicine

## 2020-07-25 MED ORDER — FAMOTIDINE 40 MG PO TABS
ORAL_TABLET | ORAL | 0 refills | Status: DC
Start: 2020-07-25 — End: 2020-09-25

## 2020-09-05 ENCOUNTER — Ambulatory Visit: Payer: Medicare Other | Admitting: Adult Health

## 2020-09-25 ENCOUNTER — Other Ambulatory Visit: Payer: Self-pay | Admitting: Internal Medicine

## 2020-10-19 ENCOUNTER — Ambulatory Visit: Payer: Medicare Other | Admitting: Orthopaedic Surgery

## 2020-10-19 ENCOUNTER — Other Ambulatory Visit: Payer: Self-pay

## 2020-10-19 ENCOUNTER — Encounter: Payer: Self-pay | Admitting: Orthopaedic Surgery

## 2020-10-19 DIAGNOSIS — M65311 Trigger thumb, right thumb: Secondary | ICD-10-CM | POA: Diagnosis not present

## 2020-10-19 NOTE — Progress Notes (Signed)
Office Visit Note   Patient: Kathryn Lamb           Date of Birth: Mar 13, 1951           MRN: [id number] Visit Date: 10/19/2020              Requested by: Unk Pinto, [address] [place],  [place] [address] PCP: Unk Pinto, MD   Assessment & Plan: Visit Diagnoses:  1. Trigger thumb, right thumb     Plan: Active triggering of right thumb.  Long discussion regarding treatment options.  Mrs. Killingsworth would like to proceed with surgical release of the A1 pulley.  We will schedule this next week pending insurance approval  Follow-Up Instructions: Return We will schedule trigger thumb release.   Orders:  No orders of the defined types were placed in this encounter.  No orders of the defined types were placed in this encounter.     Procedures: No procedures performed   Clinical Data: No additional findings.   Subjective: Chief Complaint  Patient presents with  . Right Hand - Pain  Patient presents today for her right thumb. She states that it gets stuck. It has been doing this for about four weeks and getting worse. She wears a splint at night and applies Voltaren gel. She is right hand dominant.  No related numbness or tingling  HPI  Review of Systems   Objective: Vital Signs: Ht 5\' 3"  (1.6 m)   Wt 144 lb (65.3 kg)   BMI 25.51 kg/m   Physical Exam Constitutional:      Appearance: She is well-developed.  Eyes:     Pupils: Pupils are equal, round, and reactive to light.  Pulmonary:     Effort: Pulmonary effort is normal.  Skin:    General: Skin is warm and dry.  Neurological:     Mental Status: She is alert and oriented to person, place, and time.  Psychiatric:        Behavior: Behavior normal.     Ortho Exam right thumb with active triggering and a painful nodule on the palmar aspect near the metacarpal phalangeal joint.  No swelling.  Neurologically intact  Specialty Comments:  No specialty comments  available.  Imaging: No results found.   PMFS History: Patient Active Problem List   Diagnosis Date Noted  . Trigger thumb, right thumb 10/19/2020  . Labile hypertension 05/28/2020  . Impingement syndrome of right shoulder 01/18/2020  . Osteoarthritis of right AC (acromioclavicular) joint 01/18/2020  . Os acromiale of right shoulder 01/18/2020  . Carpal tunnel syndrome, left upper limb 01/18/2020  . Aortic atherosclerosis ([place]) 01/11/2020  . Emphysema lung ([place]) 01/11/2020  . COVID-19 08/17/2019  . Encounter for Medicare annual wellness exam 03/15/2016  . Hypertension 11/16/2015  . Vitamin D deficiency 11/16/2015  . Medication management 11/16/2015  . Lower back pain 01/10/2015  . Abnormal glucose 08/22/2014  . Hyperlipidemia, mixed 10/05/2013  . Rosacea   . Anxiety   . 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 History of ischemic colitis    Past Medical History:  Diagnosis Date  . Anxiety   . Depression   . Diverticulitis   . Ischemic colitis ([place])   . Mixed hyperlipidemia 10/05/2013  . Rosacea     Family History  Problem Relation Age of Onset  . Diabetes Father   . Alcohol abuse Father   . Cancer Father        prostate, lung  .  Hypertension Mother   . Stroke Mother   . Heart disease Mother   . Diabetes Sister     Past Surgical History:  Procedure Laterality Date  . ABDOMINAL HYSTERECTOMY    . APPENDECTOMY    . CARPAL TUNNEL RELEASE Right 2004  . LUMBAR LAMINECTOMY  2000  . MICRODISCECTOMY LUMBAR    . TUBAL LIGATION     Social History   Occupational History  . Not on file  Tobacco Use  . Smoking status: Former Smoker    Packs/day: 1.00    Years: 30.00    Pack years: 30.00    Types: Cigarettes    Quit date: 10/04/2008    Years since quitting: 12.0  . Smokeless tobacco: Never Used  Vaping Use  . Vaping Use: Never used  Substance and Sexual Activity  . Alcohol use: No  . Drug use: No  . Sexual activity: Not on file

## 2020-10-26 ENCOUNTER — Other Ambulatory Visit: Payer: Self-pay | Admitting: Orthopedic Surgery

## 2020-10-26 DIAGNOSIS — M65311 Trigger thumb, right thumb: Secondary | ICD-10-CM | POA: Diagnosis not present

## 2020-10-26 MED ORDER — TRAMADOL HCL 50 MG PO TABS
50.0000 mg | ORAL_TABLET | Freq: Four times a day (QID) | ORAL | 0 refills | Status: DC | PRN
Start: 2020-10-26 — End: 2020-12-07

## 2020-10-27 ENCOUNTER — Telehealth: Payer: Self-pay

## 2020-10-27 NOTE — Telephone Encounter (Signed)
Patient spoke with Dr. Durward Fortes concerning right thumb.

## 2020-11-02 ENCOUNTER — Encounter: Payer: Self-pay | Admitting: Orthopaedic Surgery

## 2020-11-02 ENCOUNTER — Ambulatory Visit (INDEPENDENT_AMBULATORY_CARE_PROVIDER_SITE_OTHER): Payer: Medicare Other | Admitting: Orthopaedic Surgery

## 2020-11-02 ENCOUNTER — Other Ambulatory Visit: Payer: Self-pay

## 2020-11-02 VITALS — Ht 63.0 in | Wt 144.0 lb

## 2020-11-02 DIAGNOSIS — M65311 Trigger thumb, right thumb: Secondary | ICD-10-CM

## 2020-11-02 NOTE — Progress Notes (Signed)
Office Visit Note   Patient: Kathryn Lamb           Date of Birth: [birth year]           MRN: [id number] Visit Date: 11/02/2020              Requested by: Unk Pinto, [place] [address] [place],  [place] [address] PCP: Unk Pinto, MD   Assessment & Plan: Visit Diagnoses:  1. Trigger thumb, right thumb     Plan: 1 week status post release of right trigger thumb and doing well.  No longer has any triggering.  Motor and sensory exam intact.  Incision healing nicely.  One of the stitches was loose so I removed it but the wound stays intact.  Have applied a waterproof Band-Aid and will check her back next week and remove the remaining stitch  Follow-Up Instructions: Return in about 1 week (around 11/09/2020).   Orders:  No orders of the defined types were placed in this encounter.  No orders of the defined types were placed in this encounter.     Procedures: No procedures performed   Clinical Data: No additional findings.   Subjective: Chief Complaint  Patient presents with  . Right Hand - Follow-up    Right trigger thumb release 10/26/2020  Patient presents today for follow up on her right hand. She had a right trigger thumb finger release on 10/26/2020. She is doing well.   HPI  Review of Systems   Objective: Vital Signs: Ht 5\' 3"  (1.6 m)   Wt 144 lb (65.3 kg)   BMI 25.51 kg/m   Physical Exam  Ortho Exam right trigger thumb incision healing without a problem.  One of the 2 stitches is loose and it was removed and Steri-Strip was applied over benzoin.  Neurologically intact.  Cannot trigger the finger  Specialty Comments:  No specialty comments available.  Imaging: No results found.   PMFS History: Patient Active Problem List   Diagnosis Date Noted  . Trigger thumb, right thumb 10/19/2020  . Labile hypertension 05/28/2020  . Impingement syndrome of right shoulder 01/18/2020  . Osteoarthritis of right AC (acromioclavicular)  joint 01/18/2020  . Os acromiale of right shoulder 01/18/2020  . Carpal tunnel syndrome, left upper limb 01/18/2020  . Aortic atherosclerosis ([place]) 01/11/2020  . Emphysema lung ([place]) 01/11/2020  . COVID-19 08/17/2019  . Encounter for Medicare annual wellness exam 03/15/2016  . Hypertension 11/16/2015  . Vitamin D deficiency 11/16/2015  . Medication management 11/16/2015  . Lower back pain 01/10/2015  . Abnormal glucose 08/22/2014  . Hyperlipidemia, mixed 10/05/2013  . Rosacea   . Anxiety   . 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 History of ischemic colitis    Past Medical History:  Diagnosis Date  . Anxiety   . Depression   . Diverticulitis   . Ischemic colitis ([place])   . Mixed hyperlipidemia 10/05/2013  . Rosacea     Family History  Problem Relation Age of Onset  . Diabetes Father   . Alcohol abuse Father   . Cancer Father        prostate, lung  . Hypertension Mother   . Stroke Mother   . Heart disease Mother   . Diabetes Sister     Past Surgical History:  Procedure Laterality Date  . ABDOMINAL HYSTERECTOMY    . APPENDECTOMY    . CARPAL TUNNEL RELEASE Right 2004  . LUMBAR LAMINECTOMY  2000  .  MICRODISCECTOMY LUMBAR    . TUBAL LIGATION     Social History   Occupational History  . Not on file  Tobacco Use  . Smoking status: Former Smoker    Packs/day: 1.00    Years: 30.00    Pack years: 30.00    Types: Cigarettes    Quit date: 10/04/2008    Years since quitting: 12.0  . Smokeless tobacco: Never Used  Vaping Use  . Vaping Use: Never used  Substance and Sexual Activity  . Alcohol use: No  . Drug use: No  . Sexual activity: Not on file

## 2020-11-08 ENCOUNTER — Ambulatory Visit: Payer: Medicare Other | Admitting: Orthopaedic Surgery

## 2020-11-09 ENCOUNTER — Encounter: Payer: Self-pay | Admitting: Orthopaedic Surgery

## 2020-11-09 ENCOUNTER — Other Ambulatory Visit: Payer: Self-pay

## 2020-11-09 ENCOUNTER — Ambulatory Visit: Payer: Medicare Other | Admitting: Orthopaedic Surgery

## 2020-11-09 VITALS — Ht 63.0 in | Wt 144.0 lb

## 2020-11-09 DIAGNOSIS — M65311 Trigger thumb, right thumb: Secondary | ICD-10-CM

## 2020-11-09 NOTE — Progress Notes (Signed)
Office Visit Note   Patient: Kathryn Lamb           Date of Birth: 18-Jun-1951           MRN: [id number] Visit Date: 11/09/2020              Requested by: Unk Pinto, [address] [place],  [place] [address] PCP: Unk Pinto, MD   Assessment & Plan: Visit Diagnoses:  1. Trigger thumb, right thumb     Plan: 3 weeks status post release of right trigger thumb doing very well.  Incision healing without problem.  Neurologically intact.  Has regained full motion of the IP joint of the thumb and no longer has any triggering.  We will plan to see back as needed  Follow-Up Instructions: Return if symptoms worsen or fail to improve.   Orders:  No orders of the defined types were placed in this encounter.  No orders of the defined types were placed in this encounter.     Procedures: No procedures performed   Clinical Data: No additional findings.   Subjective: Chief Complaint  Patient presents with  . Right Hand - Follow-up    Right trigger thumb release 10/26/2020  Patient presents today for follow up on her right thumb. She had trigger thumb release on 10/26/2020. She is now two weeks out from surgery. She is doing well.  HPI  Review of Systems   Objective: Vital Signs: Ht 5\' 3"  (1.6 m)   Wt 144 lb (65.3 kg)   BMI 25.51 kg/m   Physical Exam  Ortho Exam right trigger thumb incision healing without problem.  Wound is healed completely.  Has full extension and little hyperextension of the IP joint neurologically intact.  Good capillary refill.  No triggering  Specialty Comments:  No specialty comments available.  Imaging: No results found.   PMFS History: Patient Active Problem List   Diagnosis Date Noted  . Trigger thumb, right thumb 10/19/2020  . Labile hypertension 05/28/2020  . Impingement syndrome of right shoulder 01/18/2020  . Osteoarthritis of right AC (acromioclavicular) joint 01/18/2020  . Os acromiale of right  shoulder 01/18/2020  . Carpal tunnel syndrome, left upper limb 01/18/2020  . Aortic atherosclerosis ([place]) 01/11/2020  . Emphysema lung ([place]) 01/11/2020  . COVID-19 08/17/2019  . Encounter for Medicare annual wellness exam 03/15/2016  . Hypertension 11/16/2015  . Vitamin D deficiency 11/16/2015  . Medication management 11/16/2015  . Lower back pain 01/10/2015  . Abnormal glucose 08/22/2014  . Hyperlipidemia, mixed 10/05/2013  . Rosacea   . Anxiety   . 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 History of ischemic colitis    Past Medical History:  Diagnosis Date  . Anxiety   . Depression   . Diverticulitis   . Ischemic colitis ([place])   . Mixed hyperlipidemia 10/05/2013  . Rosacea     Family History  Problem Relation Age of Onset  . Diabetes Father   . Alcohol abuse Father   . Cancer Father        prostate, lung  . Hypertension Mother   . Stroke Mother   . Heart disease Mother   . Diabetes Sister     Past Surgical History:  Procedure Laterality Date  . ABDOMINAL HYSTERECTOMY    . APPENDECTOMY    . CARPAL TUNNEL RELEASE Right 2004  . LUMBAR LAMINECTOMY  2000  . MICRODISCECTOMY LUMBAR    . TUBAL LIGATION  Social History   Occupational History  . Not on file  Tobacco Use  . Smoking status: Former Smoker    Packs/day: 1.00    Years: 30.00    Pack years: 30.00    Types: Cigarettes    Quit date: 10/04/2008    Years since quitting: 12.1  . Smokeless tobacco: Never Used  Vaping Use  . Vaping Use: Never used  Substance and Sexual Activity  . Alcohol use: No  . Drug use: No  . Sexual activity: Not on file

## 2020-11-21 ENCOUNTER — Ambulatory Visit: Payer: Medicare Other | Admitting: Orthopaedic Surgery

## 2020-11-21 ENCOUNTER — Other Ambulatory Visit: Payer: Self-pay

## 2020-11-21 ENCOUNTER — Encounter: Payer: Self-pay | Admitting: Orthopaedic Surgery

## 2020-11-21 DIAGNOSIS — M25511 Pain in right shoulder: Secondary | ICD-10-CM

## 2020-11-21 DIAGNOSIS — G8929 Other chronic pain: Secondary | ICD-10-CM | POA: Diagnosis not present

## 2020-11-21 NOTE — Progress Notes (Signed)
Office Visit Note   Patient: Kathryn Lamb           Date of Birth: September 30, 1950           MRN: [id number] Visit Date: 11/21/2020              Requested by: Unk Pinto, [address] [place],  [place] [address] PCP: Unk Pinto, MD   Assessment & Plan: Visit Diagnoses:  1. Chronic right shoulder pain     Plan: Recurrent pain right shoulder.  Had an MRI scan in 2018 revealing areas of rotator cuff tendinitis.  There was an os acromiale with inferior acromial spurring.  Responded to subacromial cortisone injection.  Had recurrent pain in June 2021 with injection again successful.  Now been having trouble for approximately 5 weeks with onset of pain while swatting a wasp.  Having pain with overhead activities, drying her hair or even sleeping on that side.  I am concerned she may have a rotator cuff tear and will order an MRI scan  Follow-Up Instructions: Return After MRI scan right shoulder.   Orders:  Orders Placed This Encounter  Procedures  . MR SHOULDER RIGHT WO CONTRAST   No orders of the defined types were placed in this encounter.     Procedures: No procedures performed   Clinical Data: No additional findings.   Subjective: Chief Complaint  Patient presents with  . Right Shoulder - Pain  Recurrent pain right shoulder started about 5 weeks ago while swatting a wasp.  Had immediate onset of pain with inability to raise her arm overhead.  Presently able to raise her arm overhead but with discomfort.  Having trouble drying her hair or sleeping on that side.  She is right-hand dominant.  HPI  Review of Systems   Objective: Vital Signs: There were no vitals taken for this visit.  Physical Exam Constitutional:      Appearance: She is well-developed.  Eyes:     Pupils: Pupils are equal, round, and reactive to light.  Pulmonary:     Effort: Pulmonary effort is normal.  Skin:    General: Skin is warm and dry.  Neurological:      Mental Status: She is alert and oriented to person, place, and time.  Psychiatric:        Behavior: Behavior normal.     Ortho Exam awake alert and oriented x3.  Comfortable in no acute distress.  Having difficulty raising her right arm overhead having to place her arm at her side and slowly elevating it.  Has full overhead motion.  Positive impingement.  Positive Speed sign and empty can testing with some discomfort along the anterior subacromial region.  Biceps intact.  No skin discoloration.  Good grip and release.  Had recent right trigger thumb release and doing very well  Specialty Comments:  No specialty comments available.  Imaging: No results found.   PMFS History: Patient Active Problem List   Diagnosis Date Noted  . Pain in right shoulder 11/21/2020  . Trigger thumb, right thumb 10/19/2020  . Labile hypertension 05/28/2020  . Impingement syndrome of right shoulder 01/18/2020  . Osteoarthritis of right AC (acromioclavicular) joint 01/18/2020  . Os acromiale of right shoulder 01/18/2020  . Carpal tunnel syndrome, left upper limb 01/18/2020  . Aortic atherosclerosis ([place]) 01/11/2020  . Emphysema lung ([place]) 01/11/2020  . COVID-19 08/17/2019  . Encounter for Medicare annual wellness exam 03/15/2016  . Hypertension 11/16/2015  . Vitamin D  deficiency 11/16/2015  . Medication management 11/16/2015  . Lower back pain 01/10/2015  . Abnormal glucose 08/22/2014  . Hyperlipidemia, mixed 10/05/2013  . Rosacea   . Anxiety   . 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 History of ischemic colitis    Past Medical History:  Diagnosis Date  . Anxiety   . Depression   . Diverticulitis   . Ischemic colitis ([place])   . Mixed hyperlipidemia 10/05/2013  . Rosacea     Family History  Problem Relation Age of Onset  . Diabetes Father   . Alcohol abuse Father   . Cancer Father        prostate, lung  . Hypertension Mother   . Stroke Mother   . Heart disease Mother   . Diabetes  Sister     Past Surgical History:  Procedure Laterality Date  . ABDOMINAL HYSTERECTOMY    . APPENDECTOMY    . CARPAL TUNNEL RELEASE Right 2004  . LUMBAR LAMINECTOMY  2000  . MICRODISCECTOMY LUMBAR    . TUBAL LIGATION     Social History   Occupational History  . Not on file  Tobacco Use  . Smoking status: Former Smoker    Packs/day: 1.00    Years: 30.00    Pack years: 30.00    Types: Cigarettes    Quit date: 10/04/2008    Years since quitting: 12.1  . Smokeless tobacco: Never Used  Vaping Use  . Vaping Use: Never used  Substance and Sexual Activity  . Alcohol use: No  . Drug use: No  . Sexual activity: Not on file     Garald Balding, MD   Note - This record has been created using [employer].  Chart creation errors have been sought, but may not always  have been located. Such creation errors do not reflect on  the standard of medical care.

## 2020-11-26 ENCOUNTER — Other Ambulatory Visit: Payer: Self-pay

## 2020-11-26 ENCOUNTER — Ambulatory Visit
Admission: RE | Admit: 2020-11-26 | Discharge: 2020-11-26 | Disposition: A | Discharge status: Home / Self Care | Payer: Medicare Other | Source: Ambulatory Visit | Attending: Orthopaedic Surgery | Admitting: Orthopaedic Surgery

## 2020-11-26 DIAGNOSIS — G8929 Other chronic pain: Secondary | ICD-10-CM

## 2020-11-26 DIAGNOSIS — M25511 Pain in right shoulder: Secondary | ICD-10-CM | POA: Diagnosis not present

## 2020-12-06 ENCOUNTER — Other Ambulatory Visit: Payer: Self-pay

## 2020-12-06 ENCOUNTER — Encounter: Payer: Self-pay | Admitting: Orthopaedic Surgery

## 2020-12-06 ENCOUNTER — Ambulatory Visit: Payer: Medicare Other | Admitting: Orthopaedic Surgery

## 2020-12-06 ENCOUNTER — Encounter: Payer: Self-pay | Admitting: Internal Medicine

## 2020-12-06 VITALS — Ht 63.0 in | Wt 144.0 lb

## 2020-12-06 DIAGNOSIS — M25511 Pain in right shoulder: Secondary | ICD-10-CM

## 2020-12-06 MED ORDER — VITAMIN D 125 MCG (5000 UT) PO CAPS: ORAL_CAPSULE | ORAL | 0 refills | Status: AC

## 2020-12-06 MED ORDER — VITAMIN D 125 MCG (5000 UT) PO CAPS: ORAL_CAPSULE | ORAL | 0 refills | Status: DC

## 2020-12-06 NOTE — Progress Notes (Signed)
Office Visit Note   Patient: Kathryn Lamb           Date of Birth: 14-Sep-1950           MRN: [id number] Visit Date: 12/06/2020              Requested by: Unk Pinto, [address] [place],  [place] [address] PCP: Unk Pinto, MD   Assessment & Plan: Visit Diagnoses:  1. Acute pain of right shoulder     Plan: Acute on chronic right shoulder pain.  Having pain approximately 2 months after swatting a wasp.  Having difficulty raising her arm over her head and, accordingly, I ordered an MRI scan.  The scan demonstrated severe rotator cuff tendinopathy.  The supraspinatus was completely torn and the critical zone about a centimeter medial to its insertion with a gap between fragments of 1 to 2 cm.  There was a near full-thickness undersurface tear of the subscapularis with laminar retraction.  No muscle atrophy or focal lesion.  Severe tendinopathy of both the intra and extra-articular segments of the biceps long head with a longitudinal split tearing.  Mild to moderate osteoarthritis of the [facility] joint with a type II acromion and a large volume of fluid in the subacromial bursa.  There was an os acromiale with advanced degenerative changes similar to what she has had in the past.  Mild degenerative changes in the glenohumeral joint.  No tearing of the labrum or bony lesions.  Long discussion with Mr. Mrs. Percell Miller regarding all of the above.  I certainly would suggest rotator cuff tear repair.  This would include an arthroscopic SCD and DCR and a mini open rotator cuff tear repair and biceps tenodesis.  We had a long discussion regarding the outpatient nature, incision, use of the sling and rehab and the potential problems.  She would like to proceed  Follow-Up Instructions: Return We will schedule rotator cuff tear repair surgery.   Orders:  No orders of the defined types were placed in this encounter.  No orders of the defined types were placed in this  encounter.     Procedures: No procedures performed   Clinical Data: No additional findings.   Subjective: Chief Complaint  Patient presents with  . Right Shoulder - Follow-up    MRI review  Patient presents today for follow up on her right shoulder. She had an MRI and is here today to go over those results.  No change in symptoms from prior exam.  Having difficulty raising her arm over her head  HPI  Review of Systems   Objective: Vital Signs: Ht 5\' 3"  (1.6 m)   Wt 144 lb (65.3 kg)   BMI 25.51 kg/m   Physical Exam Constitutional:      Appearance: She is well-developed.  Eyes:     Pupils: Pupils are equal, round, and reactive to light.  Pulmonary:     Effort: Pulmonary effort is normal.  Skin:    General: Skin is warm and dry.  Neurological:     Mental Status: She is alert and oriented to person, place, and time.  Psychiatric:        Behavior: Behavior normal.     Ortho Exam right shoulder with positive impingement.  Pain with overhead motion.  She is able to gradually place her arm overhead and maintain that position.  Thought she had little bit of weakness with external rotation.  Positive empty can testing and speeds sign.  Good grip and good release.  Skin intact.  Some tenderness along the anterior subacromial region  Specialty Comments:  No specialty comments available.  Imaging: No results found.   PMFS History: Patient Active Problem List   Diagnosis Date Noted  . Pain in right shoulder 11/21/2020  . Trigger thumb, right thumb 10/19/2020  . Labile hypertension 05/28/2020  . Impingement syndrome of right shoulder 01/18/2020  . Osteoarthritis of right AC (acromioclavicular) joint 01/18/2020  . Os acromiale of right shoulder 01/18/2020  . Carpal tunnel syndrome, left upper limb 01/18/2020  . Aortic atherosclerosis ([place]) 01/11/2020  . Emphysema lung ([place]) 01/11/2020  . COVID-19 08/17/2019  . Encounter for Medicare annual wellness exam 03/15/2016   . Hypertension 11/16/2015  . Vitamin D deficiency 11/16/2015  . Medication management 11/16/2015  . Lower back pain 01/10/2015  . Abnormal glucose 08/22/2014  . Hyperlipidemia, mixed 10/05/2013  . Rosacea   . Anxiety   . 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[place])   . History of ischemic colitis    Past Medical History:  Diagnosis Date  . Anxiety   . Depression   . Diverticulitis   . Ischemic colitis ([place])   . Mixed hyperlipidemia 10/05/2013  . Rosacea     Family History  Problem Relation Age of Onset  . Diabetes Father   . Alcohol abuse Father   . Cancer Father        prostate, lung  . Hypertension Mother   . Stroke Mother   . Heart disease Mother   . Diabetes Sister     Past Surgical History:  Procedure Laterality Date  . ABDOMINAL HYSTERECTOMY    . APPENDECTOMY    . CARPAL TUNNEL RELEASE Right 2004  . LUMBAR LAMINECTOMY  2000  . MICRODISCECTOMY LUMBAR    . TUBAL LIGATION     Social History   Occupational History  . Not on file  Tobacco Use  . Smoking status: Former Smoker    Packs/day: 1.00    Years: 30.00    Pack years: 30.00    Types: Cigarettes    Quit date: 10/04/2008    Years since quitting: 12.1  . Smokeless tobacco: Never Used  Vaping Use  . Vaping Use: Never used  Substance and Sexual Activity  . Alcohol use: No  . Drug use: No  . Sexual activity: Not on file

## 2020-12-06 NOTE — Progress Notes (Signed)
Future Appointments  Date Time Provider [place]  12/07/2020  2:30 PM Unk Pinto, MD [facility] None  06/14/2021 11:00 AM Unk Pinto, MD [facility] None    History of Present Illness:       This very nice 70 y.o.  MWF presents for 6 month follow up with HTN, HLD, Pre-Diabetes and Vitamin D Deficiency.  Chest CT scan on 01/07/2020 showed Aortic Atherosclerosis.        Patient has upcoming  Right shoulder surgery scheduled by Dr Durward Fortes for Rotator Cuff & tendon tear repair.        Patient is followed for labile HTN & BP has been controlled at home. Today's BP is at goal  - 138/76. Patient has had no complaints of any cardiac type chest pain, palpitations, dyspnea / orthopnea / PND, dizziness, claudication, or dependent edema.       Hyperlipidemia is controlled with diet & Atorvastatin. Patient denies myalgias or other med SE's. Last Lipids were at goal:  Lab Results  Component Value Date   CHOL 160 05/29/2020   HDL 48 (L) 05/29/2020   LDLCALC 90 05/29/2020   TRIG 128 05/29/2020   CHOLHDL 3.3 05/29/2020     Also, the patient has history of PreDiabetes (A1c 5.7% /2016) and has had no symptoms of reactive hypoglycemia, diabetic polys, paresthesias or visual blurring.  Last A1c was normal & at goal:  Lab Results  Component Value Date   HGBA1C 5.6 05/29/2020        Further, the patient also has history of Vitamin D Deficiency ("41" /2019) and supplements vitamin D without any suspected side-effects. Last vitamin D was elevated & dose was tapered  From 10,000 to 5,000 units/day:  Lab Results  Component Value Date   VD25OH 129 (H) 05/29/2020    Current Outpatient Medications on File Prior to Visit  Medication Sig  . aspirin EC 81 MG tablet Take  daily  . atorvastatin (LIPITOR) 80 MG tablet TAKE 1 TABLET  DAILY   . buPROPionXL) 300 MG 24 hr tablet TAKE 1 TABLET  EVERY MORNING  . cetirizine (ZYRTEC) 10 MG tablet Take  daily.  .  cyanocobalamin 1000 MCG tablet Take  daily.  . famotidine (PEPCID) 40 MG tablet TAKE 1 TABLET   DAILY   . FLAXSEED OIL  1,300 mg  Take daily  . gabapentin  100 MG capsule TAKE 2 CAPS 3  X/dayAS NEEDED   . Multiple Vitamin  Take 1 tablet  3 times per week. M,W,F  . PROBIOTIC DAILY  Take  daily.     Allergies  Allergen Reactions  . Doxycycline   . Erythromycin   . Flagyl [Metronidazole]   . Penicillins   . Sulfa Antibiotics     PMHx:   Past Medical History:  Diagnosis Date  . Anxiety   . Depression   . Diverticulitis   . Ischemic colitis ([place])   . Mixed hyperlipidemia 10/05/2013  . Rosacea     Immunization History  Administered Date(s) Administered  . Influenza Split 05/17/2013, 05/17/2014  . Influenza, High Dose   05/30/2017, 06/01/2018, 08/02/2019  . Influenza 06/03/2018  . PPD Test 05/04/2014  . Pneumococcal - 13 05/09/2015  . Pneumococcal - 23 10/04/2013  . Td 08/30/2002  . Tdap 04/07/2012  . Varicella Zoster Immune Globulin 03/15/2016    Past Surgical History:  Procedure Laterality Date  . ABDOMINAL HYSTERECTOMY    . APPENDECTOMY    . CARPAL TUNNEL RELEASE Right  2004  . LUMBAR LAMINECTOMY  2000  . MICRODISCECTOMY LUMBAR    . TUBAL LIGATION      FHx:    Reviewed / unchanged  SHx:    Reviewed / unchanged   Systems Review:  Constitutional: Denies fever, chills, wt changes, headaches, insomnia, fatigue, night sweats, change in appetite. Eyes: Denies redness, blurred vision, diplopia, discharge, itchy, watery eyes.  ENT: Denies discharge, congestion, post nasal drip, epistaxis, sore throat, earache, hearing loss, dental pain, tinnitus, vertigo, sinus pain, snoring.  CV: Denies chest pain, palpitations, irregular heartbeat, syncope, dyspnea, diaphoresis, orthopnea, PND, claudication or edema. Respiratory: denies cough, dyspnea, DOE, pleurisy, hoarseness, laryngitis, wheezing.  Gastrointestinal: Denies dysphagia, odynophagia, heartburn, reflux, water brash,  abdominal pain or cramps, nausea, vomiting, bloating, diarrhea, constipation, hematemesis, melena, hematochezia  or hemorrhoids. Genitourinary: Denies dysuria, frequency, urgency, nocturia, hesitancy, discharge, hematuria or flank pain. Musculoskeletal: Denies arthralgias, myalgias, stiffness, jt. swelling, pain, limping or strain/sprain.  Skin: Denies pruritus, rash, hives, warts, acne, eczema or change in skin lesion(s). Neuro: No weakness, tremor, incoordination, spasms, paresthesia or pain. Psychiatric: Denies confusion, memory loss or sensory loss. Endo: Denies change in weight, skin or hair change.  Heme/Lymph: No excessive bleeding, bruising or enlarged lymph nodes.  Physical Exam  BP 138/76   Pulse 76   Temp 97.7 F (36.5 C)   Resp 16   Ht 5\' 3"  (1.6 m)   Wt 148 lb 12.8 oz (67.5 kg)   SpO2 98%   BMI 26.36 kg/m   Appears  well nourished, well groomed  and in no distress.  Eyes: PERRLA, EOMs, conjunctiva no swelling or erythema. Sinuses: No frontal/maxillary tenderness ENT/Mouth: EAC's clear, TM's nl w/o erythema, bulging. Nares clear w/o erythema, swelling, exudates. Oropharynx clear without erythema or exudates. Oral hygiene is good. Tongue normal, non obstructing. Hearing intact.  Neck: Supple. Thyroid not palpable. Car 2+/2+ without bruits, nodes or JVD. Chest: Respirations nl with BS clear & equal w/o rales, rhonchi, wheezing or stridor.  Cor: Heart sounds normal w/ regular rate and rhythm without sig. murmurs, gallops, clicks or rubs. Peripheral pulses normal and equal  without edema.  Abdomen: Soft & bowel sounds normal. Non-tender w/o guarding, rebound, hernias, masses or organomegaly.  Lymphatics: Unremarkable.  Musculoskeletal: Full ROM all peripheral extremities, joint stability, 5/5 strength and normal gait.  Skin: Warm, dry without exposed rashes, lesions or ecchymosis apparent.  Neuro: Cranial nerves intact, reflexes equal bilaterally. Sensory-motor testing  grossly intact. Tendon reflexes grossly intact.  Pysch: Alert & oriented x 3.  Insight and judgement nl & appropriate. No ideations.  Assessment and Plan:  1. Labile hypertension  - Continue medication, monitor blood pressure at home.  - Continue DASH diet.  Reminder to go to the ER if any CP,  SOB, nausea, dizziness, severe HA, changes vision/speech.  - CBC with Differential/Platelet - COMPLETE METABOLIC PANEL WITH GFR - Magnesium - TSH  2. Hyperlipidemia, mixed  - Continue diet/meds, exercise,& lifestyle modifications.  - Continue monitor periodic cholesterol/liver & renal functions   - Lipid panel - TSH  3. Abnormal glucose  - Continue diet, exercise  - Lifestyle modifications.  - Monitor appropriate labs  - Hemoglobin A1c - Insulin, random  4. Vitamin D deficiency  - Continue supplementation.  - VITAMIN D 25 Hydroxyl  5. Aortic atherosclerosis ([place]) by Chest CT scan 12/2019  - Lipid panel  6. Medication management  - CBC with Differential/Platelet - COMPLETE METABOLIC PANEL WITH GFR - Magnesium - Lipid panel - TSH - Hemoglobin  A1c - Insulin, random - VITAMIN D 25 Hydroxyl        Discussed  regular exercise, BP monitoring, weight control to achieve/maintain BMI less than 25 and discussed med and SE's. Recommended labs to assess and monitor clinical status with further disposition pending results of labs.  I discussed the assessment and treatment plan with the patient. The patient was provided an opportunity to ask questions and all were answered. The patient agreed with the plan and demonstrated an understanding of the instructions.  I provided over 30 minutes of exam, counseling, chart review and  complex critical decision making.         The patient was advised to call back or seek an in-person evaluation if the symptoms worsen or if the condition fails to improve as anticipated.   Kirtland Bouchard, MD

## 2020-12-06 NOTE — Patient Instructions (Signed)

## 2020-12-07 ENCOUNTER — Ambulatory Visit (INDEPENDENT_AMBULATORY_CARE_PROVIDER_SITE_OTHER): Payer: Medicare Other | Admitting: Internal Medicine

## 2020-12-07 VITALS — BP 138/76 | HR 76 | Temp 97.7°F | Resp 16 | Ht 63.0 in | Wt 148.8 lb

## 2020-12-07 DIAGNOSIS — I700 Atherosclerosis of aorta: Secondary | ICD-10-CM | POA: Diagnosis not present

## 2020-12-07 DIAGNOSIS — R0989 Other specified symptoms and signs involving the circulatory and respiratory systems: Secondary | ICD-10-CM

## 2020-12-07 DIAGNOSIS — E782 Mixed hyperlipidemia: Secondary | ICD-10-CM

## 2020-12-07 DIAGNOSIS — E559 Vitamin D deficiency, unspecified: Secondary | ICD-10-CM

## 2020-12-07 DIAGNOSIS — Z79899 Other long term (current) drug therapy: Secondary | ICD-10-CM

## 2020-12-07 DIAGNOSIS — R7309 Other abnormal glucose: Secondary | ICD-10-CM | POA: Diagnosis not present

## 2020-12-07 MED ORDER — DICLOFENAC SODIUM 1 % EX GEL: CUTANEOUS | 3 refills | Status: DC

## 2020-12-08 LAB — CBC WITH DIFFERENTIAL/PLATELET
Absolute Monocytes: 321 cells/uL (ref 200–950)
Basophils Absolute: 41 cells/uL (ref 0–200)
Basophils Relative: 0.8 %
Eosinophils Absolute: 204 cells/uL (ref 15–500)
Eosinophils Relative: 4 %
HCT: 39.4 % (ref 35.0–45.0)
Hemoglobin: 13.1 g/dL (ref 11.7–15.5)
Lymphs Abs: 2254 cells/uL (ref 850–3900)
MCH: 29.9 pg (ref 27.0–33.0)
MCHC: 33.2 g/dL (ref 32.0–36.0)
MCV: 90 fL (ref 80.0–100.0)
MPV: 9.8 fL (ref 7.5–12.5)
Monocytes Relative: 6.3 %
Neutro Abs: 2280 cells/uL (ref 1500–7800)
Neutrophils Relative %: 44.7 %
Platelets: 276 10*3/uL (ref 140–400)
RBC: 4.38 10*6/uL (ref 3.80–5.10)
RDW: 12.5 % (ref 11.0–15.0)
Total Lymphocyte: 44.2 %
WBC: 5.1 10*3/uL (ref 3.8–10.8)

## 2020-12-08 LAB — COMPLETE METABOLIC PANEL WITH GFR
AG Ratio: 2.1 (calc) (ref 1.0–2.5)
ALT: 29 U/L (ref 6–29)
AST: 21 U/L (ref 10–35)
Albumin: 4.6 g/dL (ref 3.6–5.1)
Alkaline phosphatase (APISO): 51 U/L (ref 37–153)
BUN: 12 mg/dL (ref 7–25)
CO2: 29 mmol/L (ref 20–32)
Calcium: 9.5 mg/dL (ref 8.6–10.4)
Chloride: 105 mmol/L (ref 98–110)
Creat: 0.75 mg/dL (ref 0.60–0.93)
GFR, Est African American: 93 mL/min/{1.73_m2} (ref >=60)
GFR, Est Non African American: 80 mL/min/{1.73_m2} (ref >=60)
Globulin: 2.2 g/dL (calc) (ref 1.9–3.7)
Glucose, Bld: 105 mg/dL — ABNORMAL HIGH (ref 65–99)
Potassium: 4 mmol/L (ref 3.5–5.3)
Sodium: 141 mmol/L (ref 135–146)
Total Bilirubin: 0.7 mg/dL (ref 0.2–1.2)
Total Protein: 6.8 g/dL (ref 6.1–8.1)

## 2020-12-08 LAB — INSULIN, RANDOM: Insulin: 64.6 u[IU]/mL — ABNORMAL HIGH

## 2020-12-08 LAB — HEMOGLOBIN A1C
Hgb A1c MFr Bld: 5.8 % of total Hgb — ABNORMAL HIGH (ref <5.7)
Mean Plasma Glucose: 120 mg/dL
eAG (mmol/L): 6.6 mmol/L

## 2020-12-08 LAB — VITAMIN D 25 HYDROXY (VIT D DEFICIENCY, FRACTURES): Vit D, 25-Hydroxy: 81 ng/mL (ref 30–100)

## 2020-12-08 LAB — LIPID PANEL
Cholesterol: 186 mg/dL (ref <200)
HDL: 47 mg/dL — ABNORMAL LOW (ref >=50)
LDL Cholesterol (Calc): 99 mg/dL (calc)
Non-HDL Cholesterol (Calc): 139 mg/dL (calc) — ABNORMAL HIGH (ref <130)
Total CHOL/HDL Ratio: 4 (calc) (ref <5.0)
Triglycerides: 300 mg/dL — ABNORMAL HIGH (ref <150)

## 2020-12-08 LAB — TSH: TSH: 2.75 mIU/L (ref 0.40–4.50)

## 2020-12-08 LAB — MAGNESIUM: Magnesium: 2.1 mg/dL (ref 1.5–2.5)

## 2020-12-08 NOTE — Progress Notes (Signed)
============================================================ -   Test results slightly outside the reference range are not unusual. If there is anything important, I will review this with you,  otherwise it is considered normal test values.  If you have further questions,  please do not hesitate to contact me at the office or via My Chart.  ============================================================ ============================================================  -  Total Chol = 186 and  LDL Chol = 99 - Both  Excellent   - Very low risk for Heart Attack  / Stroke ============================================================ ============================================================  -  but Triglycerides (  300   ) or fats in blood are too high  (goal is less than 150)    - Recommend avoid fried & greasy foods,  sweets / candy,   - Avoid white rice  (brown or wild rice or Quinoa is OK),   - Avoid white potatoes  (sweet potatoes are OK)   - Avoid anything made from white flour  - bagels, doughnuts, rolls, buns, biscuits, white and   wheat breads, pizza crust and traditional  pasta made of white flour & egg white  - (vegetarian pasta or spinach or wheat pasta is OK).    - Multi-grain bread is OK - like multi-grain flat bread or  sandwich thins.   - Avoid alcohol in excess.   - Exercise is also important. ============================================================ ============================================================  -  A1c = 5.8%  is up a little again,   So. . . . . . . . . . Marland Kitchen   - Avoid Sweets, Candy & White Stuff   - White Rice, White [place], White Flour  - Breads &  Pasta ============================================================ ============================================================  -  Vitamin D = 81 - Excellent  ! ============================================================ ============================================================  -  All  Else - CBC - Kidneys - Electrolytes - Liver - Magnesium & Thyroid    - all  Normal / OK ===========================================================   - Keep up the Great Work  !  ============================================================ ============================================================

## 2020-12-13 ENCOUNTER — Telehealth: Payer: Self-pay | Admitting: Orthopaedic Surgery

## 2020-12-13 NOTE — Telephone Encounter (Signed)
Patient is scheduled for right shoulder scope with Dr. Durward Fortes on 12-21-20 at [place] [facility]. Please see note below from the pre-op assessment nurse and advise patient prior to surgery date:  Please have surgeon advise patient regarding aspirin use. Thanks!   Michaell Cowing RN BSN (Pre-op Assessment) [phone]. [phone] [email or web address] [id number] [email or web address]

## 2020-12-13 NOTE — Telephone Encounter (Signed)
Stop 5 days prior to surgery

## 2020-12-21 ENCOUNTER — Other Ambulatory Visit: Payer: Self-pay | Admitting: Orthopedic Surgery

## 2020-12-21 DIAGNOSIS — M67814 Other specified disorders of tendon, left shoulder: Secondary | ICD-10-CM | POA: Diagnosis not present

## 2020-12-21 DIAGNOSIS — M19011 Primary osteoarthritis, right shoulder: Secondary | ICD-10-CM | POA: Diagnosis not present

## 2020-12-21 DIAGNOSIS — S46011A Strain of muscle(s) and tendon(s) of the rotator cuff of right shoulder, initial encounter: Secondary | ICD-10-CM | POA: Diagnosis not present

## 2020-12-21 DIAGNOSIS — M94211 Chondromalacia, right shoulder: Secondary | ICD-10-CM | POA: Diagnosis not present

## 2020-12-21 DIAGNOSIS — G8918 Other acute postprocedural pain: Secondary | ICD-10-CM | POA: Diagnosis not present

## 2020-12-21 DIAGNOSIS — M7521 Bicipital tendinitis, right shoulder: Secondary | ICD-10-CM | POA: Diagnosis not present

## 2020-12-21 DIAGNOSIS — M659 Synovitis and tenosynovitis, unspecified: Secondary | ICD-10-CM | POA: Diagnosis not present

## 2020-12-21 DIAGNOSIS — M7541 Impingement syndrome of right shoulder: Secondary | ICD-10-CM | POA: Diagnosis not present

## 2020-12-21 DIAGNOSIS — M75121 Complete rotator cuff tear or rupture of right shoulder, not specified as traumatic: Secondary | ICD-10-CM | POA: Diagnosis not present

## 2020-12-21 HISTORY — PX: SHOULDER ARTHROSCOPY WITH ROTATOR CUFF REPAIR: SHX5685

## 2020-12-21 MED ORDER — OXYCODONE-ACETAMINOPHEN 5-325 MG PO TABS
1.0000 | ORAL_TABLET | ORAL | 0 refills | Status: DC | PRN
Start: 2020-12-21 — End: 2021-02-07

## 2020-12-28 ENCOUNTER — Other Ambulatory Visit: Payer: Self-pay

## 2020-12-28 ENCOUNTER — Telehealth: Payer: Self-pay | Admitting: Orthopaedic Surgery

## 2020-12-28 ENCOUNTER — Encounter: Payer: Self-pay | Admitting: Orthopaedic Surgery

## 2020-12-28 ENCOUNTER — Other Ambulatory Visit: Payer: Self-pay | Admitting: Orthopaedic Surgery

## 2020-12-28 ENCOUNTER — Ambulatory Visit (INDEPENDENT_AMBULATORY_CARE_PROVIDER_SITE_OTHER): Payer: Medicare Other | Admitting: Orthopaedic Surgery

## 2020-12-28 DIAGNOSIS — M25811 Other specified joint disorders, right shoulder: Secondary | ICD-10-CM

## 2020-12-28 DIAGNOSIS — M25511 Pain in right shoulder: Secondary | ICD-10-CM

## 2020-12-28 DIAGNOSIS — G8929 Other chronic pain: Secondary | ICD-10-CM

## 2020-12-28 DIAGNOSIS — M19011 Primary osteoarthritis, right shoulder: Secondary | ICD-10-CM

## 2020-12-28 DIAGNOSIS — M7541 Impingement syndrome of right shoulder: Secondary | ICD-10-CM

## 2020-12-28 MED ORDER — OXYCODONE-ACETAMINOPHEN 5-325 MG PO TABS
1.0000 | ORAL_TABLET | ORAL | 0 refills | Status: DC | PRN
Start: 2020-12-28 — End: 2021-02-07

## 2020-12-28 MED ORDER — OXYCODONE-ACETAMINOPHEN 5-325 MG PO TABS
1.0000 | ORAL_TABLET | ORAL | 0 refills | Status: DC | PRN
Start: 2020-12-28 — End: 2020-12-28

## 2020-12-28 NOTE — Progress Notes (Signed)
Office Visit Note   Patient: Kathryn Lamb           Date of Birth: [birth year]           MRN: [id number] Visit Date: 12/28/2020              Requested by: Unk Pinto, [place] [address] [place],  [place] [address] PCP: Unk Pinto, MD   Assessment & Plan: Visit Diagnoses:  1. Chronic right shoulder pain   2. Os acromiale of right shoulder   3. Osteoarthritis of right AC (acromioclavicular) joint   4. Impingement syndrome of right shoulder     Plan: 1 week status post right shoulder surgery including an arthroscopic SCD, DCR and a mini open rotator cuff tear repair and biceps tenodesis.  Doing quite well.  Dressing removed and Steri-Strips applied.  May begin circumduction exercises.  We will start physical therapy for passive range of motion for 6 weeks.  Renew prescription for Percocet.  Continue wearing the sling.  Office 3 weeks  Follow-Up Instructions: Return in about 3 weeks (around 01/18/2021).   Orders:  Orders Placed This Encounter  Procedures  . Ambulatory referral to Physical Therapy   No orders of the defined types were placed in this encounter.     Procedures: No procedures performed   Clinical Data: No additional findings.   Subjective: Chief Complaint  Patient presents with  . Right Shoulder - Follow-up    Right shoulder arthroscopy 12/21/2020  Patient presents today for follow up on her right shoulder. She had a right shoulder arthroscopy on 12/21/2020. She is now one week out from surgery. Patient states that she is doing well. She is now out of pain medicine.   HPI  Review of Systems   Objective: Vital Signs: There were no vitals taken for this visit.  Physical Exam  Ortho Exam right shoulder incisions healing without problem.  New Steri-Strips applied over benzoin.  No evidence of infection.  Neurologically intact.  Very minimal ecchymosis.  Specialty Comments:  No specialty comments available.  Imaging: No results  found.   PMFS History: Patient Active Problem List   Diagnosis Date Noted  . Pain in right shoulder 11/21/2020  . Trigger thumb, right thumb 10/19/2020  . Labile hypertension 05/28/2020  . Impingement syndrome of right shoulder 01/18/2020  . Osteoarthritis of right AC (acromioclavicular) joint 01/18/2020  . Os acromiale of right shoulder 01/18/2020  . Carpal tunnel syndrome, left upper limb 01/18/2020  . Aortic atherosclerosis ([place]) by Chest CT scan 01/07/2020 01/11/2020  . Emphysema lung ([place]) 01/11/2020  . COVID-19 08/17/2019  . Encounter for Medicare annual wellness exam 03/15/2016  . Hypertension 11/16/2015  . Vitamin D deficiency 11/16/2015  . Medication management 11/16/2015  . Lower back pain 01/10/2015  . Abnormal glucose 08/22/2014  . Hyperlipidemia, mixed 10/05/2013  . Rosacea   . Anxiety   . 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 History of ischemic colitis    Past Medical History:  Diagnosis Date  . Anxiety   . Depression   . Diverticulitis   . Ischemic colitis ([place])   . Mixed hyperlipidemia 10/05/2013  . Rosacea     Family History  Problem Relation Age of Onset  . Diabetes Father   . Alcohol abuse Father   . Cancer Father        prostate, lung  . Hypertension Mother   . Stroke Mother   . Heart disease Mother   .  Diabetes Sister     Past Surgical History:  Procedure Laterality Date  . ABDOMINAL HYSTERECTOMY    . APPENDECTOMY    . CARPAL TUNNEL RELEASE Right 2004  . LUMBAR LAMINECTOMY  2000  . MICRODISCECTOMY LUMBAR    . TUBAL LIGATION     Social History   Occupational History  . Not on file  Tobacco Use  . Smoking status: Former Smoker    Packs/day: 1.00    Years: 30.00    Pack years: 30.00    Types: Cigarettes    Quit date: 10/04/2008    Years since quitting: 12.2  . Smokeless tobacco: Never Used  Vaping Use  . Vaping Use: Never used  Substance and Sexual Activity  . Alcohol use: No  . Drug use: No  . Sexual activity: Not on  file

## 2020-12-28 NOTE — Telephone Encounter (Signed)
Sent in percocet

## 2020-12-28 NOTE — Telephone Encounter (Signed)
Called patient. No answer. Left message that Percocet has been sent to her pharmacy on file.

## 2020-12-28 NOTE — Telephone Encounter (Signed)
Patient called. She would like to know if there will be some pain medication called in for her. Her call back number is [phone]

## 2020-12-28 NOTE — Telephone Encounter (Signed)
Patient called. Says the pharmacy will not refill the oxycodone until 5/31. Would like to know what to do until then for pain. Her call back number is [phone]

## 2021-01-02 ENCOUNTER — Other Ambulatory Visit: Payer: Self-pay

## 2021-01-02 ENCOUNTER — Encounter: Payer: Self-pay | Admitting: Rehabilitative and Restorative Service Providers"

## 2021-01-02 ENCOUNTER — Ambulatory Visit (INDEPENDENT_AMBULATORY_CARE_PROVIDER_SITE_OTHER): Payer: Medicare Other | Admitting: Rehabilitative and Restorative Service Providers"

## 2021-01-02 DIAGNOSIS — M25511 Pain in right shoulder: Secondary | ICD-10-CM

## 2021-01-02 DIAGNOSIS — M6281 Muscle weakness (generalized): Secondary | ICD-10-CM | POA: Diagnosis not present

## 2021-01-02 DIAGNOSIS — R600 Localized edema: Secondary | ICD-10-CM | POA: Diagnosis not present

## 2021-01-02 DIAGNOSIS — G8929 Other chronic pain: Secondary | ICD-10-CM

## 2021-01-02 NOTE — Therapy (Signed)
[facility] Physical Therapy [address] [place], [place], [address] Phone: [phone]   Fax:  [phone]  Physical Therapy Evaluation  Patient Details  Name: Kathryn Lamb MRN: [id number] Date of Birth: 06-Sep-1950 Referring Provider (PT): Dr. Durward Fortes   Encounter Date: 01/02/2021   PT End of Session - 01/02/21 1431    Visit Number 1    Number of Visits 18    Date for PT Re-Evaluation 03/13/21    Authorization Type UHC Medicare    Progress Note Due on Visit 10    PT Start Time 1430    PT Stop Time 1505    PT Time Calculation (min) 35 min    Activity Tolerance Patient tolerated treatment well    Behavior During Therapy [facility] for tasks assessed/performed           Past Medical History:  Diagnosis Date  . Anxiety   . Depression   . Diverticulitis   . Ischemic colitis ([place])   . Mixed hyperlipidemia 10/05/2013  . Rosacea     Past Surgical History:  Procedure Laterality Date  . ABDOMINAL HYSTERECTOMY    . APPENDECTOMY    . CARPAL TUNNEL RELEASE Right 2004  . LUMBAR LAMINECTOMY  2000  . MICRODISCECTOMY LUMBAR    . TUBAL LIGATION      There were no vitals filed for this visit.    Subjective Assessment - 01/02/21 1430    Subjective Referred to clinic s/p recent Rt shoulder RTC repair on 12/21/2020 with instruction for 6 weeks PROM only per referral on 12/28/2020.  Pt. indicated trouble for about 4 years with history of injections.  Pt. stated fall was possible for initial cause but its been years.  Pt. indicated in March she moved arm quickly and let to surgery performance.  Pt. indicated MD told her she could take arm out of sling every now and then and move elbow as well. Pt. stated still having pain in shoulder/arm.  Taking OTC medicine more than prescription as she can.  Pt. stated sleeping in recliner.    Limitations House hold activities;Sitting;Lifting    Patient Stated Goals Reduce pain    Currently in Pain? Yes    Pain Score 2    pain at  worst 4/10   Pain Location Shoulder    Pain Orientation Right    Pain Descriptors / Indicators Throbbing;Aching;Sore    Pain Type Chronic pain;Surgical pain    Pain Onset More than a month ago    Pain Frequency Intermittent    Aggravating Factors  arm movements, resting pain noted for sleeping    Pain Relieving Factors medicine    Effect of Pain on Daily Activities Limited in Rt arm active movement, lifting, carrying due to surgery              [facility] PT Assessment - 01/02/21 0001      Assessment   Medical Diagnosis Rt shoulder pain s/p RTC repair    Referring Provider (PT) Dr. Durward Fortes    Onset Date/Surgical Date 12/21/20    Hand Dominance Right      Precautions   Precautions Shoulder    Precaution Comments PROM 6 weeks from referral on 12/28/2020      Balance Screen   Has the patient fallen in the past 6 months No    Has the patient had a decrease in activity level because of a fear of falling?  No    Is the patient reluctant to leave their home because  of a fear of falling?  No      Home Ecologist residence    Additional Comments Has stairs but bedroom on first floor      Prior Function   Level of Independence Independent    Vocation Retired    Leisure Goes to grandson sporting events, walking for exercise      Cognition   Overall Cognitive Status Within Functional Limits for tasks assessed      Observation/Other Assessments   Focus on Therapeutic Outcomes (FOTO)  intake 36%, predicted 61 %      Sensation   Light Touch Appears Intact      Posture/Postural Control   Posture Comments Rt arm in sling upon arrival      ROM / Strength   AROM / PROM / Strength Strength;PROM;AROM      AROM   Overall AROM Comments Lt shoulder AROM WFL at this time.  AROM not measured on Rt due to surgical protocol.    AROM Assessment Site Shoulder    Right/Left Shoulder Left;Right      PROM   Overall PROM Comments Mild pain complaints in end  ranges all directions Rt arm    PROM Assessment Site Shoulder    Right/Left Shoulder Left;Right    Right Shoulder Flexion 135 Degrees   in supine   Right Shoulder ABduction 100 Degrees   in supine   Right Shoulder Internal Rotation 80 Degrees   in supine 45 deg abduction   Right Shoulder External Rotation 50 Degrees   in supine 45 deg abduction     Strength   Overall Strength Comments Rt shoulder MMT not performed today due to surgical protocol    Strength Assessment Site Shoulder;Elbow    Right/Left Shoulder Left;Right    Left Shoulder Flexion 5/5    Left Shoulder ABduction 5/5    Left Shoulder Internal Rotation 5/5    Left Shoulder External Rotation 5/5    Right/Left Elbow Right;Left    Left Elbow Flexion 5/5    Left Elbow Extension 5/5      Palpation   Palpation comment No specific joint mobility deficits noted on Rt shoulder                      Objective measurements completed on examination: See above findings.       [facility] Adult PT Treatment/Exercise - 01/02/21 0001      Exercises   Exercises Other Exercises;Shoulder    Other Exercises  HEP instruction/performance c cues for techniques, handout provided.  Trial set performed of each for comprehension and symptom assessment.  HEP consisting of seated supported pendulums, standing pendulums, supine passive assisted flexion, scapular retraction      Manual Therapy   Manual therapy comments Rt PROM all directions to tolerance                  PT Education - 01/02/21 1431    Education Details HEP, POC, protocol    Person(s) Educated Patient    Methods Explanation;Demonstration;Verbal cues;Handout    Comprehension Returned demonstration;Verbalized understanding            PT Short Term Goals - 01/02/21 1525      PT SHORT TERM GOAL #1   Title Patient will demonstrate independent use of home exercise program to maintain progress from in clinic treatments.    Time 3    Period Weeks    Status  New  Target Date 01/23/21             PT Long Term Goals - 01/02/21 1523      PT LONG TERM GOAL #1   Title Patient will demonstrate/report pain at worst less than or equal to 2/10 to facilitate minimal limitation in daily activity secondary to pain symptoms.    Time 10    Period Weeks    Status New    Target Date 03/13/21      PT LONG TERM GOAL #2   Title Patient will demonstrate independent use of home exercise program to facilitate ability to maintain/progress functional gains from skilled physical therapy services.    Time 10    Period Weeks    Status New    Target Date 03/13/21      PT LONG TERM GOAL #3   Title Patient will demonstrate Rt [place] joint mobility WFL to facilitate usual self care, dressing, reaching overhead at PLOF s limitation due to symptoms.    Time 10    Period Weeks    Status New    Target Date 03/13/21      PT LONG TERM GOAL #4   Title Patient will demonstrate Rt UE MMT 5/5 throughout to facilitate usual lifting, carrying in functional activity to PLOF s limitation.    Time 10    Period Weeks    Status New    Target Date 03/13/21      PT LONG TERM GOAL #5   Title Pt. will demonstrate FOTO outcome > or = 61 to indicated reduced disability due to condition.    Time 10    Period Weeks    Status New    Target Date 03/13/21                  Plan - 01/02/21 1459    Clinical Impression Statement Patient is a 70 y.o. who comes to clinic with complaints of Rt shoulder pain s/p RTC repair 12/21/2020 with mobility, strength and movement coordination deficits that impair their ability to perform usual daily and recreational functional activities without increase difficulty/symptoms at this time.  Patient to benefit from skilled PT services to address impairments and limitations to improve to previous level of function without restriction secondary to condition.    Examination-Activity Limitations Sleep;Bathing;Caring for  Others;Carry;Toileting;Dressing;Hygiene/Grooming;Lift;Reach Overhead    Examination-Participation Restrictions Driving;Community Activity;Cleaning;Laundry;Yard Work;Meal Prep    Stability/Clinical Decision Making Stable/Uncomplicated    Clinical Decision Making Low    Rehab Potential Good    PT Frequency Other (comment)   1x/week for 4 weeks then 2x/week for POC of 10 weeks   PT Duration Other (comment)   10 weeks   PT Treatment/Interventions ADLs/Self Care Home Management;Cryotherapy;Iontophoresis 4mg /ml Dexamethasone;Moist Heat;Traction;Balance training;Therapeutic exercise;Therapeutic activities;Functional mobility training;Electrical Stimulation;Stair training;Gait training;DME Instruction;Ultrasound;Neuromuscular re-education;Patient/family education;Passive range of motion;Spinal Manipulations;Joint Manipulations;Dry needling;Taping;Vasopneumatic Device;Manual techniques    PT Next Visit Plan PROM only until 02/08/2021.    PT Home Exercise Plan [email or web address]    Consulted and Agree with Plan of Care Patient           Patient will benefit from skilled therapeutic intervention in order to improve the following deficits and impairments:  Decreased endurance,Pain,Impaired UE functional use,Decreased strength,Decreased activity tolerance,Decreased range of motion,Impaired flexibility,Impaired perceived functional ability,Decreased mobility,Increased edema  Visit Diagnosis: Chronic right shoulder pain  Muscle weakness (generalized)  Localized edema     Problem List Patient Active Problem List   Diagnosis Date Noted  . Pain in right shoulder 11/21/2020  .  Trigger thumb, right thumb 10/19/2020  . Labile hypertension 05/28/2020  . Impingement syndrome of right shoulder 01/18/2020  . Osteoarthritis of right AC (acromioclavicular) joint 01/18/2020  . Os acromiale of right shoulder 01/18/2020  . Carpal tunnel syndrome, left upper limb 01/18/2020  . Aortic atherosclerosis ([place]) by Chest CT  scan 01/07/2020 01/11/2020  . Emphysema lung ([place]) 01/11/2020  . COVID-19 08/17/2019  . Encounter for Medicare annual wellness exam 03/15/2016  . Hypertension 11/16/2015  . Vitamin D deficiency 11/16/2015  . Medication management 11/16/2015  . Lower back pain 01/10/2015  . Abnormal glucose 08/22/2014  . Hyperlipidemia, mixed 10/05/2013  . Rosacea   . Anxiety   . 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 History of ischemic colitis     Scot Jun, PT, DPT, OCS, ATC 01/02/21  3:33 PM    [facility] Physical Therapy [address] [place], [place], [address] Phone: [phone]   Fax:  [phone]  Name: Kathryn Lamb MRN: [id number] Date of Birth: May 07, 1951

## 2021-01-02 NOTE — Patient Instructions (Signed)
Access Code: [email or web address] URL: [email or web address] Date: 01/02/2021 Prepared by: Scot Jun  Exercises Seated Scapular Retraction - 2 x daily - 7 x weekly - 2 sets - 10 reps - 5 hold Circular Shoulder Pendulum with Table Support - 2 x daily - 7 x weekly - 2 sets - 10 reps Standing Circular Shoulder Pendulum Supported with Arm Bent - 2 x daily - 7 x weekly - 2 sets - 10 reps Standing Flexion Extension Shoulder Pendulum Supported with Arm Bent - 2 x daily - 7 x weekly - 2 sets - 10 reps Supine Shoulder Flexion PROM (Mirrored) - 2 x daily - 7 x weekly - 2 sets - 10 reps - 5 hold

## 2021-01-10 ENCOUNTER — Other Ambulatory Visit: Payer: Self-pay

## 2021-01-10 ENCOUNTER — Ambulatory Visit: Payer: Medicare Other | Admitting: Rehabilitative and Restorative Service Providers"

## 2021-01-10 ENCOUNTER — Encounter: Payer: Self-pay | Admitting: Rehabilitative and Restorative Service Providers"

## 2021-01-10 DIAGNOSIS — R600 Localized edema: Secondary | ICD-10-CM

## 2021-01-10 DIAGNOSIS — G8929 Other chronic pain: Secondary | ICD-10-CM | POA: Diagnosis not present

## 2021-01-10 DIAGNOSIS — M25511 Pain in right shoulder: Secondary | ICD-10-CM | POA: Diagnosis not present

## 2021-01-10 DIAGNOSIS — M6281 Muscle weakness (generalized): Secondary | ICD-10-CM | POA: Diagnosis not present

## 2021-01-10 NOTE — Therapy (Signed)
[facility] Physical Therapy [address] [place], [place], [address] Phone: [phone]   Fax:  [phone]  Physical Therapy Treatment  Patient Details  Name: Kathryn Lamb MRN: [id number] Date of Birth: 1951/04/16 Referring Provider (PT): Dr. Durward Fortes   Encounter Date: 01/10/2021   PT End of Session - 01/10/21 1007    Visit Number 2    Number of Visits 18    Date for PT Re-Evaluation 03/13/21    Authorization Type UHC Medicare    Progress Note Due on Visit 10    PT Start Time 1013    PT Stop Time 1047    PT Time Calculation (min) 34 min    Activity Tolerance Patient tolerated treatment well    Behavior During Therapy [facility] for tasks assessed/performed           Past Medical History:  Diagnosis Date  . Anxiety   . Depression   . Diverticulitis   . Ischemic colitis ([place])   . Mixed hyperlipidemia 10/05/2013  . Rosacea     Past Surgical History:  Procedure Laterality Date  . ABDOMINAL HYSTERECTOMY    . APPENDECTOMY    . CARPAL TUNNEL RELEASE Right 2004  . LUMBAR LAMINECTOMY  2000  . MICRODISCECTOMY LUMBAR    . TUBAL LIGATION      There were no vitals filed for this visit.   Subjective Assessment - 01/10/21 1015    Subjective Pt. indicated she was using Rt arm to adjust something and it caused noted increase in pain for several days and icing helped some.  Pt. stated ache in front and side of upper arm primarily.  Pt. stated exercises one time( not troublesome).    Limitations House hold activities;Sitting;Lifting    Patient Stated Goals Reduce pain    Currently in Pain? Yes    Pain Score 3     Pain Location Shoulder    Pain Orientation Right    Pain Type Chronic pain;Surgical pain    Pain Onset More than a month ago    Pain Frequency Constant    Aggravating Factors  using arm at home (outside of established guidelines)    Pain Relieving Factors icing, rest                             [facility] Adult PT Treatment/Exercise  - 01/10/21 0001      Modalities   Modalities Vasopneumatic;Electrical Stimulation      Electrical Stimulation   Electrical Stimulation Location Rt shoulder    Electrical Stimulation Action IFC    Electrical Stimulation Parameters to tolerance    Electrical Stimulation Goals Pain      Vasopneumatic   Number Minutes Vasopneumatic  10 minutes    Vasopnuematic Location  Shoulder   Rt   Vasopneumatic Pressure Medium    Vasopneumatic Temperature  34      Manual Therapy   Manual therapy comments Rt PROM all directions to tolerance                    PT Short Term Goals - 01/10/21 1036      PT SHORT TERM GOAL #1   Title Patient will demonstrate independent use of home exercise program to maintain progress from in clinic treatments.    Time 3    Period Weeks    Status On-going    Target Date 01/23/21  PT Long Term Goals - 01/02/21 1523      PT LONG TERM GOAL #1   Title Patient will demonstrate/report pain at worst less than or equal to 2/10 to facilitate minimal limitation in daily activity secondary to pain symptoms.    Time 10    Period Weeks    Status New    Target Date 03/13/21      PT LONG TERM GOAL #2   Title Patient will demonstrate independent use of home exercise program to facilitate ability to maintain/progress functional gains from skilled physical therapy services.    Time 10    Period Weeks    Status New    Target Date 03/13/21      PT LONG TERM GOAL #3   Title Patient will demonstrate Rt [place] joint mobility WFL to facilitate usual self care, dressing, reaching overhead at PLOF s limitation due to symptoms.    Time 10    Period Weeks    Status New    Target Date 03/13/21      PT LONG TERM GOAL #4   Title Patient will demonstrate Rt UE MMT 5/5 throughout to facilitate usual lifting, carrying in functional activity to PLOF s limitation.    Time 10    Period Weeks    Status New    Target Date 03/13/21      PT LONG TERM GOAL #5    Title Pt. will demonstrate FOTO outcome > or = 61 to indicated reduced disability due to condition.    Time 10    Period Weeks    Status New    Target Date 03/13/21                 Plan - 01/10/21 1037    Clinical Impression Statement Elevated symptoms noted from self Rt arm use.  At end of treatment, overall mobility was painfree and no new stiffness complaints noted in assessment.  Continued education given to avoid active Rt arm movements at this time per surgical protocol.    Examination-Activity Limitations Sleep;Bathing;Caring for Others;Carry;Toileting;Dressing;Hygiene/Grooming;Lift;Reach Overhead    Examination-Participation Restrictions Driving;Community Activity;Cleaning;Laundry;Yard Work;Meal Prep    Stability/Clinical Decision Making Stable/Uncomplicated    Rehab Potential Good    PT Frequency Other (comment)   1x/week for 4 weeks then 2x/week for POC of 10 weeks   PT Duration Other (comment)   10 weeks   PT Treatment/Interventions ADLs/Self Care Home Management;Cryotherapy;Iontophoresis 4mg /ml Dexamethasone;Moist Heat;Traction;Balance training;Therapeutic exercise;Therapeutic activities;Functional mobility training;Electrical Stimulation;Stair training;Gait training;DME Instruction;Ultrasound;Neuromuscular re-education;Patient/family education;Passive range of motion;Spinal Manipulations;Joint Manipulations;Dry needling;Taping;Vasopneumatic Device;Manual techniques    PT Next Visit Plan PROM only until 02/08/2021, modalites prn    PT Home Exercise Plan [email or web address]    Consulted and Agree with Plan of Care Patient           Patient will benefit from skilled therapeutic intervention in order to improve the following deficits and impairments:  Decreased endurance,Pain,Impaired UE functional use,Decreased strength,Decreased activity tolerance,Decreased range of motion,Impaired flexibility,Impaired perceived functional ability,Decreased mobility,Increased edema  Visit  Diagnosis: Chronic right shoulder pain  Muscle weakness (generalized)  Localized edema     Problem List Patient Active Problem List   Diagnosis Date Noted  . Pain in right shoulder 11/21/2020  . Trigger thumb, right thumb 10/19/2020  . Labile hypertension 05/28/2020  . Impingement syndrome of right shoulder 01/18/2020  . Osteoarthritis of right AC (acromioclavicular) joint 01/18/2020  . Os acromiale of right shoulder 01/18/2020  . Carpal tunnel syndrome, left upper limb 01/18/2020  .  Aortic atherosclerosis ([place]) by Chest CT scan 01/07/2020 01/11/2020  . Emphysema lung ([place]) 01/11/2020  . COVID-19 08/17/2019  . Encounter for Medicare annual wellness exam 03/15/2016  . Hypertension 11/16/2015  . Vitamin D deficiency 11/16/2015  . Medication management 11/16/2015  . Lower back pain 01/10/2015  . Abnormal glucose 08/22/2014  . Hyperlipidemia, mixed 10/05/2013  . Rosacea   . Anxiety   . 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 History of ischemic colitis     Scot Jun, PT, DPT, OCS, ATC 01/10/21  10:38 AM    [facility] Physical Therapy [address] [place], [place], [address] Phone: [phone]   Fax:  [phone]  Name: TAMYRAH BURBAGE MRN: [id number] Date of Birth: 1951-08-11

## 2021-01-15 ENCOUNTER — Telehealth: Payer: Self-pay | Admitting: Rehabilitative and Restorative Service Providers"

## 2021-01-15 NOTE — Telephone Encounter (Signed)
Pt. indicated she noticed a pain in Rt arm quick after bending over to clean floor and noticed some Rt upper arm knot show up the next morning.  Taking medicine now, relatively painless.   Pt. Had previously noted pain c active arm use at home prior to last visit that resolved some by her last visit.  Instructions were given on that visit and today's phone call to not use Rt arm actively per protocol.   Discussed use of continued HEP at this time as long as it wasn't aggravating (passive ROM only at this time ) and will recheck upon return to clinic Wednesday.   MD visit Thursday.  Scot Jun, PT, DPT, OCS, ATC 01/15/21  11:03 AM

## 2021-01-17 ENCOUNTER — Other Ambulatory Visit: Payer: Self-pay

## 2021-01-17 ENCOUNTER — Ambulatory Visit: Payer: Medicare Other | Admitting: Rehabilitative and Restorative Service Providers"

## 2021-01-17 ENCOUNTER — Encounter: Payer: Self-pay | Admitting: Rehabilitative and Restorative Service Providers"

## 2021-01-17 DIAGNOSIS — M6281 Muscle weakness (generalized): Secondary | ICD-10-CM

## 2021-01-17 DIAGNOSIS — M25511 Pain in right shoulder: Secondary | ICD-10-CM

## 2021-01-17 DIAGNOSIS — R600 Localized edema: Secondary | ICD-10-CM

## 2021-01-17 DIAGNOSIS — G8929 Other chronic pain: Secondary | ICD-10-CM | POA: Diagnosis not present

## 2021-01-17 NOTE — Therapy (Signed)
[facility] [address] [place], [place], [address] Phone: [phone]   Fax:  [phone]  Physical Therapy Treatment  Patient Details  Name: Kathryn Lamb MRN: [id number] Date of Birth: [birth year] Referring Provider (PT): Dr. Durward Fortes   Encounter Date: 01/17/2021   PT End of Session - 01/17/21 1341    Visit Number 3    Number of Visits 18    Date for PT Re-Evaluation 03/13/21    Authorization Type UHC Medicare    Progress Note Due on Visit 10    PT Start Time 1344    PT Stop Time 1418    PT Time Calculation (min) 34 min    Activity Tolerance Patient tolerated treatment well    Behavior During Therapy [facility] for tasks assessed/performed           Past Medical History:  Diagnosis Date  . Anxiety   . Depression   . Diverticulitis   . Ischemic colitis ([place])   . Mixed hyperlipidemia 10/05/2013  . Rosacea     Past Surgical History:  Procedure Laterality Date  . ABDOMINAL HYSTERECTOMY    . APPENDECTOMY    . CARPAL TUNNEL RELEASE Right 2004  . LUMBAR LAMINECTOMY  2000  . MICRODISCECTOMY LUMBAR    . TUBAL LIGATION      There were no vitals filed for this visit.   Subjective Assessment - 01/17/21 1341    Subjective To summary phone call from between visits: Pt. indicated noticing complaints of upper arm pain after getting up from floor and noticed Rt upper arm had "ball like" deformity the next day.  Pt. indicated pain reduced after initial complaint.    No pain upon arrival today.    Limitations House hold activities;Sitting;Lifting    Patient Stated Goals Reduce pain    Currently in Pain? No/denies    Pain Score 0-No pain    Pain Location Shoulder    Pain Onset More than a month ago              [facility] PT Assessment - 01/17/21 0001      Assessment   Medical Diagnosis Rt shoulder pain s/p RTC repair    Referring Provider (PT) Dr. Durward Fortes    Onset Date/Surgical Date 12/21/20    Hand Dominance Right      Precautions    Precaution Comments PROM 6 weeks from referral on 12/28/2020      PROM   Right Shoulder Flexion 150 Degrees    Right Shoulder ABduction 125 Degrees    Right Shoulder Internal Rotation 85 Degrees   in supine 45 deg abduction   Right Shoulder External Rotation 65 Degrees   in supine 45 deg abduction     Special Tests   Other special tests Popeye sign on Rt arm visually inspected today                         [facility] Adult PT Treatment/Exercise - 01/17/21 0001      Vasopneumatic   Number Minutes Vasopneumatic  10 minutes    Vasopnuematic Location  Shoulder   Rt   Vasopneumatic Pressure Medium    Vasopneumatic Temperature  34      Manual Therapy   Manual therapy comments Rt PROM all directions to tolerance                    PT Short Term Goals - 01/17/21 1412      PT  SHORT TERM GOAL #1   Title Patient will demonstrate independent use of home exercise program to maintain progress from in clinic treatments.    Time 3    Period Weeks    Status Achieved    Target Date 01/23/21             PT Long Term Goals - 01/02/21 1523      PT LONG TERM GOAL #1   Title Patient will demonstrate/report pain at worst less than or equal to 2/10 to facilitate minimal limitation in daily activity secondary to pain symptoms.    Time 10    Period Weeks    Status New    Target Date 03/13/21      PT LONG TERM GOAL #2   Title Patient will demonstrate independent use of home exercise program to facilitate ability to maintain/progress functional gains from skilled physical therapy services.    Time 10    Period Weeks    Status New    Target Date 03/13/21      PT LONG TERM GOAL #3   Title Patient will demonstrate Rt [place] joint mobility WFL to facilitate usual self care, dressing, reaching overhead at PLOF s limitation due to symptoms.    Time 10    Period Weeks    Status New    Target Date 03/13/21      PT LONG TERM GOAL #4   Title Patient will demonstrate Rt UE MMT  5/5 throughout to facilitate usual lifting, carrying in functional activity to PLOF s limitation.    Time 10    Period Weeks    Status New    Target Date 03/13/21      PT LONG TERM GOAL #5   Title Pt. will demonstrate FOTO outcome > or = 61 to indicated reduced disability due to condition.    Time 10    Period Weeks    Status New    Target Date 03/13/21                 Plan - 01/17/21 1408    Clinical Impression Statement Pt. arrived today after instance at home of Rt upper arm pain c getting back up from cleaning floor, resulting in residual "ball" in Rt anterior distal upper arm.  Inspection today confirmed presence of popeye sign for Rt arm with possible indicate of long head of biceps involvement.  Pain returned to low grade and not specific to area, usual related to more original shoulder complaints from post surgical timeline. Despite new presentation for upper arm, Pt. has continued to show good progression in passive mobility as documented with minimal to no signs of hypomobility within capsule.  Review of HEP with passive movements only was given as well as continued cues for avoiding active movement in daily activity.  Return to MD tomorrow, will adjust plan accordingly.    Examination-Activity Limitations Sleep;Bathing;Caring for Others;Carry;Toileting;Dressing;Hygiene/Grooming;Lift;Reach Overhead    Examination-Participation Restrictions Driving;Community Activity;Cleaning;Laundry;Yard Work;Meal Prep    Stability/Clinical Decision Making Stable/Uncomplicated    Rehab Potential Good    PT Frequency Other (comment)   1x/week for 4 weeks then 2x/week for POC of 10 weeks   PT Duration Other (comment)   10 weeks   PT Treatment/Interventions ADLs/Self Care Home Management;Cryotherapy;Iontophoresis 4mg /ml Dexamethasone;Moist Heat;Traction;Balance training;Therapeutic exercise;Therapeutic activities;Functional mobility training;Electrical Stimulation;Stair training;Gait training;DME  Instruction;Ultrasound;Neuromuscular re-education;Patient/family education;Passive range of motion;Spinal Manipulations;Joint Manipulations;Dry needling;Taping;Vasopneumatic Device;Manual techniques    PT Next Visit Plan PROM only until 02/08/2021, modalites prn for symptoms.  continue passive progression pending any MD POC changes following visit.    PT Home Exercise Plan [id number]    Consulted and Agree with Plan of Care Patient           Patient will benefit from skilled therapeutic intervention in order to improve the following deficits and impairments:  Decreased endurance,Pain,Impaired UE functional use,Decreased strength,Decreased activity tolerance,Decreased range of motion,Impaired flexibility,Impaired perceived functional ability,Decreased mobility,Increased edema  Visit Diagnosis: Chronic right shoulder pain  Muscle weakness (generalized)  Localized edema     Problem List Patient Active Problem List   Diagnosis Date Noted  . Pain in right shoulder 11/21/2020  . Trigger thumb, right thumb 10/19/2020  . Labile hypertension 05/28/2020  . Impingement syndrome of right shoulder 01/18/2020  . Osteoarthritis of right AC (acromioclavicular) joint 01/18/2020  . Os acromiale of right shoulder 01/18/2020  . Carpal tunnel syndrome, left upper limb 01/18/2020  . Aortic atherosclerosis ([place]) by Chest CT scan 01/07/2020 01/11/2020  . Emphysema lung ([place]) 01/11/2020  . COVID-19 08/17/2019  . Encounter for Medicare annual wellness exam 03/15/2016  . Hypertension 11/16/2015  . Vitamin D deficiency 11/16/2015  . Medication management 11/16/2015  . Lower back pain 01/10/2015  . Abnormal glucose 08/22/2014  . Hyperlipidemia, mixed 10/05/2013  . Rosacea   . Anxiety   . 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 History of ischemic colitis    Scot Jun, PT, DPT, OCS, ATC 01/17/21  2:12 PM    [facility] [address] [place], [place],  [address] Phone: [phone]   Fax:  [phone]  Name: Kathryn Lamb MRN: [id number] Date of Birth: 1951-08-07

## 2021-01-18 ENCOUNTER — Encounter: Payer: Self-pay | Admitting: Orthopaedic Surgery

## 2021-01-18 ENCOUNTER — Ambulatory Visit: Payer: Medicare Other | Admitting: Orthopaedic Surgery

## 2021-01-18 DIAGNOSIS — M75121 Complete rotator cuff tear or rupture of right shoulder, not specified as traumatic: Secondary | ICD-10-CM | POA: Insufficient documentation

## 2021-01-18 DIAGNOSIS — G5602 Carpal tunnel syndrome, left upper limb: Secondary | ICD-10-CM

## 2021-01-18 DIAGNOSIS — S46011D Strain of muscle(s) and tendon(s) of the rotator cuff of right shoulder, subsequent encounter: Secondary | ICD-10-CM

## 2021-01-18 DIAGNOSIS — M25511 Pain in right shoulder: Secondary | ICD-10-CM

## 2021-01-18 DIAGNOSIS — G8929 Other chronic pain: Secondary | ICD-10-CM

## 2021-01-18 DIAGNOSIS — M7541 Impingement syndrome of right shoulder: Secondary | ICD-10-CM

## 2021-01-18 HISTORY — DX: Complete rotator cuff tear or rupture of right shoulder, not specified as traumatic: M75.121

## 2021-01-18 MED ORDER — LIDOCAINE HCL 1 % IJ SOLN
1.0000 mL | INTRAMUSCULAR | Status: AC | PRN
  Administered 2021-01-18: 16:00:00 1 mL

## 2021-01-18 NOTE — Progress Notes (Signed)
Office Visit Note   Patient: Kathryn Lamb           Date of Birth: [birth year]           MRN: [id number] Visit Date: 01/18/2021              Requested by: Unk Pinto, [address] [place],  [place] [address] PCP: Unk Pinto, MD   Assessment & Plan: Visit Diagnoses:  1. Impingement syndrome of right shoulder   2. Chronic right shoulder pain   3. Traumatic complete tear of right rotator cuff, subsequent encounter     Plan Kathryn Lamb is a month status post rotator cuff tear repair and biceps tenodesis right shoulder with an arthroscopic SCD and DCR.  Doing well with minimal pain.  Continues to be followed in physical therapy with good progress.  We will plan to see her back in a month.  She will wean herself from the sling in 2 weeks.  Also has symptoms of left carpal tunnel with positive Phalen's Tinel's over the median nerve and numbness and tingling into the radial 3 digits.  I have injected the carpal canal with betamethasone and Xylocaine today  Follow-Up Instructions: Return in about 1 month (around 02/17/2021).   Orders:  No orders of the defined types were placed in this encounter.  No orders of the defined types were placed in this encounter.     Procedures: Hand/UE Inj: L carpal tunnel for carpal tunnel syndrome on 01/18/2021 3:30 PM Indications: pain Details: 27 G needle, volar approach Medications: 1 mL lidocaine 1 %  6 mg betamethasone injected with Xylocaine into the left carpal canal     Clinical Data: No additional findings.   Subjective: Chief Complaint  Patient presents with   Right Shoulder - Follow-up    Doing ok, states that yesterday at therapy she thinks she tore something in the shoulder/arm. She states that she looks like Popeye now. She is wearing a sling, but she states that there is no real pain.  Also experiencing left carpal tunnel symptoms with numbness and tingling into the radial 3 digits  HPI  Review  of Systems   Objective: Vital Signs: There were no vitals taken for this visit.  Physical Exam Constitutional:      Appearance: She is well-developed.  Eyes:     Pupils: Pupils are equal, round, and reactive to light.  Pulmonary:     Effort: Pulmonary effort is normal.  Skin:    General: Skin is warm and dry.  Neurological:     Mental Status: She is alert and oriented to person, place, and time.  Psychiatric:        Behavior: Behavior normal.    Ortho Exam nearly full overhead passive flexion and abduction right shoulder.  Incisions of healed nicely.  Good grip and good release and good strength.  No crepitation.  Positive Phalen's and Tinel's over the left wrist with discomfort into the radial 3 digits.  Neurologically intact.  Good opposition of thumb the little finger  Specialty Comments:  No specialty comments available.  Imaging: No results found.   PMFS History: Patient Active Problem List   Diagnosis Date Noted   Complete tear of right rotator cuff 01/18/2021   Pain in right shoulder 11/21/2020   Trigger thumb, right thumb 10/19/2020   Labile hypertension 05/28/2020   Impingement syndrome of right shoulder 01/18/2020   Osteoarthritis of right AC (acromioclavicular) joint 01/18/2020   Os acromiale  of right shoulder 01/18/2020   Carpal tunnel syndrome, left upper limb 01/18/2020   Aortic atherosclerosis ([place]) by Chest CT scan 01/07/2020 01/11/2020   Emphysema lung ([place]) 01/11/2020   COVID-19 08/17/2019   Encounter for Medicare annual wellness exam 03/15/2016   Hypertension 11/16/2015   Vitamin D deficiency 11/16/2015   Medication management 11/16/2015   Lower back pain 01/10/2015   Abnormal glucose 08/22/2014   Hyperlipidemia, mixed 10/05/2013   Rosacea    Anxiety    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[place])    History of ischemic colitis    Past Medical History:  Diagnosis Date   Anxiety    Depression    Diverticulitis    Ischemic colitis ([place])     Mixed hyperlipidemia 10/05/2013   Rosacea     Family History  Problem Relation Age of Onset   Diabetes Father    Alcohol abuse Father    Cancer Father        prostate, lung   Hypertension Mother    Stroke Mother    Heart disease Mother    Diabetes Sister     Past Surgical History:  Procedure Laterality Date   ABDOMINAL HYSTERECTOMY     APPENDECTOMY     CARPAL TUNNEL RELEASE Right 2004   LUMBAR LAMINECTOMY  2000   MICRODISCECTOMY LUMBAR     TUBAL LIGATION     Social History   Occupational History   Not on file  Tobacco Use   Smoking status: Former    Packs/day: 1.00    Years: 30.00    Pack years: 30.00    Types: Cigarettes    Quit date: 10/04/2008    Years since quitting: 12.2   Smokeless tobacco: Never  Vaping Use   Vaping Use: Never used  Substance and Sexual Activity   Alcohol use: No   Drug use: No   Sexual activity: Not on file     Garald Balding, MD   Note - This record has been created using Editor, commissioning.  Chart creation errors have been sought, but may not always  have been located. Such creation errors do not reflect on  the standard of medical care.

## 2021-01-24 ENCOUNTER — Ambulatory Visit: Payer: Medicare Other | Admitting: Rehabilitative and Restorative Service Providers"

## 2021-01-24 ENCOUNTER — Other Ambulatory Visit: Payer: Self-pay

## 2021-01-24 ENCOUNTER — Encounter: Payer: Self-pay | Admitting: Rehabilitative and Restorative Service Providers"

## 2021-01-24 DIAGNOSIS — M25511 Pain in right shoulder: Secondary | ICD-10-CM | POA: Diagnosis not present

## 2021-01-24 DIAGNOSIS — R600 Localized edema: Secondary | ICD-10-CM

## 2021-01-24 DIAGNOSIS — G8929 Other chronic pain: Secondary | ICD-10-CM

## 2021-01-24 DIAGNOSIS — M6281 Muscle weakness (generalized): Secondary | ICD-10-CM | POA: Diagnosis not present

## 2021-01-24 NOTE — Therapy (Signed)
[facility] [address] [place], [place], [address] Phone: [phone]   Fax:  [phone]  Physical Therapy Treatment  Patient Details  Name: Kathryn Lamb MRN: [id number] Date of Birth: 1951/01/11 Referring Provider (PT): Dr. Durward Fortes   Encounter Date: 01/24/2021   PT End of Session - 01/24/21 1333     Visit Number 4    Number of Visits 18    Date for PT Re-Evaluation 03/13/21    Authorization Type UHC Medicare    Progress Note Due on Visit 10    PT Start Time 1339    PT Stop Time 1414    PT Time Calculation (min) 35 min    Activity Tolerance Patient tolerated treatment well    Behavior During Therapy [facility] for tasks assessed/performed             Past Medical History:  Diagnosis Date   Anxiety    Depression    Diverticulitis    Ischemic colitis ([place])    Mixed hyperlipidemia 10/05/2013   Rosacea     Past Surgical History:  Procedure Laterality Date   ABDOMINAL HYSTERECTOMY     APPENDECTOMY     CARPAL TUNNEL RELEASE Right 2004   LUMBAR LAMINECTOMY  2000   MICRODISCECTOMY LUMBAR     TUBAL LIGATION      There were no vitals filed for this visit.   Subjective Assessment - 01/24/21 1337     Subjective Pt. indicated she was informed by MD about getting out of sling for night and also for sitting.  Pt. indicated pain was noted from reaching over chair to grab something, sore today.    Limitations House hold activities;Sitting;Lifting    Patient Stated Goals Reduce pain    Currently in Pain? Yes    Pain Score 3     Pain Location Shoulder    Pain Orientation Right    Pain Descriptors / Indicators Sore;Tightness    Pain Type Chronic pain;Surgical pain    Pain Onset More than a month ago    Pain Frequency Intermittent    Aggravating Factors  using arm    Pain Relieving Factors rest, sling                               [facility] Adult PT Treatment/Exercise - 01/24/21 0001       Shoulder Exercises:  Supine   Other Supine Exercises scap retraction 5 sec hold x 20      Shoulder Exercises: Seated   Other Seated Exercises seated pendulum c Lt UE driven movement flexion, abd/add horizontally, circles cw, ccw 10 x each way      Vasopneumatic   Number Minutes Vasopneumatic  10 minutes    Vasopnuematic Location  Shoulder    Vasopneumatic Pressure Medium    Vasopneumatic Temperature  34      Manual Therapy   Manual therapy comments Rt PROM all directions to tolerance                      PT Short Term Goals - 01/17/21 1412       PT SHORT TERM GOAL #1   Title Patient will demonstrate independent use of home exercise program to maintain progress from in clinic treatments.    Time 3    Period Weeks    Status Achieved    Target Date 01/23/21  PT Long Term Goals - 01/02/21 1523       PT LONG TERM GOAL #1   Title Patient will demonstrate/report pain at worst less than or equal to 2/10 to facilitate minimal limitation in daily activity secondary to pain symptoms.    Time 10    Period Weeks    Status New    Target Date 03/13/21      PT LONG TERM GOAL #2   Title Patient will demonstrate independent use of home exercise program to facilitate ability to maintain/progress functional gains from skilled physical therapy services.    Time 10    Period Weeks    Status New    Target Date 03/13/21      PT LONG TERM GOAL #3   Title Patient will demonstrate Rt [place] joint mobility WFL to facilitate usual self care, dressing, reaching overhead at PLOF s limitation due to symptoms.    Time 10    Period Weeks    Status New    Target Date 03/13/21      PT LONG TERM GOAL #4   Title Patient will demonstrate Rt UE MMT 5/5 throughout to facilitate usual lifting, carrying in functional activity to PLOF s limitation.    Time 10    Period Weeks    Status New    Target Date 03/13/21      PT LONG TERM GOAL #5   Title Pt. will demonstrate FOTO outcome > or = 61 to  indicated reduced disability due to condition.    Time 10    Period Weeks    Status New    Target Date 03/13/21                   Plan - 01/24/21 1355     Clinical Impression Statement Continued movement passively looking well and no pain aggravation when turely relaxed.  Reviewed course of sling withdrawel matching what Patient indicated was discussed in MD visit.  Awaiting active movement progression at end of month.    Examination-Activity Limitations Sleep;Bathing;Caring for Others;Carry;Toileting;Dressing;Hygiene/Grooming;Lift;Reach Overhead    Examination-Participation Restrictions Driving;Community Activity;Cleaning;Laundry;Yard Work;Meal Prep    Stability/Clinical Decision Making Stable/Uncomplicated    Rehab Potential Good    PT Frequency Other (comment)   1x/week for 4 weeks then 2x/week for POC of 10 weeks   PT Duration Other (comment)   10 weeks   PT Treatment/Interventions ADLs/Self Care Home Management;Cryotherapy;Iontophoresis 4mg /ml Dexamethasone;Moist Heat;Traction;Balance training;Therapeutic exercise;Therapeutic activities;Functional mobility training;Electrical Stimulation;Stair training;Gait training;DME Instruction;Ultrasound;Neuromuscular re-education;Patient/family education;Passive range of motion;Spinal Manipulations;Joint Manipulations;Dry needling;Taping;Vasopneumatic Device;Manual techniques    PT Next Visit Plan PROM only until 02/08/2021, modalites prn for symptoms c manual/ther ex maintenance of PROM    PT Home Exercise Plan [id number]    Consulted and Agree with Plan of Care Patient             Patient will benefit from skilled therapeutic intervention in order to improve the following deficits and impairments:  Decreased endurance, Pain, Impaired UE functional use, Decreased strength, Decreased activity tolerance, Decreased range of motion, Impaired flexibility, Impaired perceived functional ability, Decreased mobility, Increased edema  Visit  Diagnosis: Chronic right shoulder pain  Muscle weakness (generalized)  Localized edema     Problem List Patient Active Problem List   Diagnosis Date Noted   Complete tear of right rotator cuff 01/18/2021   Pain in right shoulder 11/21/2020   Trigger thumb, right thumb 10/19/2020   Labile hypertension 05/28/2020   Impingement syndrome of right shoulder 01/18/2020  Osteoarthritis of right AC (acromioclavicular) joint 01/18/2020   Os acromiale of right shoulder 01/18/2020   Carpal tunnel syndrome, left upper limb 01/18/2020   Aortic atherosclerosis ([place]) by Chest CT scan 01/07/2020 01/11/2020   Emphysema lung ([place]) 01/11/2020   COVID-19 08/17/2019   Encounter for Medicare annual wellness exam 03/15/2016   Hypertension 11/16/2015   Vitamin D deficiency 11/16/2015   Medication management 11/16/2015   Lower back pain 01/10/2015   Abnormal glucose 08/22/2014   Hyperlipidemia, mixed 10/05/2013   Rosacea    Anxiety    Major depression in full remission [facility])    History of ischemic colitis     Scot Jun, PT, DPT, OCS, ATC 01/24/21  2:05 PM    [facility] [address] [place], [place], [address] Phone: [phone]   Fax:  [phone]  Name: FALISHA OSMENT MRN: [id number] Date of Birth: [birth year]

## 2021-01-31 ENCOUNTER — Encounter: Payer: Medicare Other | Admitting: Rehabilitative and Restorative Service Providers"

## 2021-02-07 ENCOUNTER — Ambulatory Visit (INDEPENDENT_AMBULATORY_CARE_PROVIDER_SITE_OTHER): Payer: Medicare Other | Admitting: Rehabilitative and Restorative Service Providers"

## 2021-02-07 ENCOUNTER — Other Ambulatory Visit: Payer: Self-pay

## 2021-02-07 ENCOUNTER — Encounter: Payer: Self-pay | Admitting: Adult Health

## 2021-02-07 ENCOUNTER — Encounter: Payer: Self-pay | Admitting: Rehabilitative and Restorative Service Providers"

## 2021-02-07 ENCOUNTER — Ambulatory Visit (INDEPENDENT_AMBULATORY_CARE_PROVIDER_SITE_OTHER): Payer: Medicare Other | Admitting: Adult Health

## 2021-02-07 VITALS — BP 124/72 | HR 66 | Temp 97.7°F | Wt 150.5 lb

## 2021-02-07 DIAGNOSIS — M25511 Pain in right shoulder: Secondary | ICD-10-CM

## 2021-02-07 DIAGNOSIS — R600 Localized edema: Secondary | ICD-10-CM

## 2021-02-07 DIAGNOSIS — Z8719 Personal history of other diseases of the digestive system: Secondary | ICD-10-CM

## 2021-02-07 DIAGNOSIS — J439 Emphysema, unspecified: Secondary | ICD-10-CM

## 2021-02-07 DIAGNOSIS — M6281 Muscle weakness (generalized): Secondary | ICD-10-CM | POA: Diagnosis not present

## 2021-02-07 DIAGNOSIS — R6889 Other general symptoms and signs: Secondary | ICD-10-CM

## 2021-02-07 DIAGNOSIS — L719 Rosacea, unspecified: Secondary | ICD-10-CM | POA: Diagnosis not present

## 2021-02-07 DIAGNOSIS — Z0001 Encounter for general adult medical examination with abnormal findings: Secondary | ICD-10-CM

## 2021-02-07 DIAGNOSIS — Z0000 Encounter for general adult medical examination without abnormal findings: Secondary | ICD-10-CM

## 2021-02-07 DIAGNOSIS — Z79899 Other long term (current) drug therapy: Secondary | ICD-10-CM

## 2021-02-07 DIAGNOSIS — G8929 Other chronic pain: Secondary | ICD-10-CM

## 2021-02-07 DIAGNOSIS — E782 Mixed hyperlipidemia: Secondary | ICD-10-CM

## 2021-02-07 DIAGNOSIS — E559 Vitamin D deficiency, unspecified: Secondary | ICD-10-CM

## 2021-02-07 DIAGNOSIS — F3342 Major depressive disorder, recurrent, in full remission: Secondary | ICD-10-CM

## 2021-02-07 DIAGNOSIS — R0989 Other specified symptoms and signs involving the circulatory and respiratory systems: Secondary | ICD-10-CM

## 2021-02-07 DIAGNOSIS — Z87891 Personal history of nicotine dependence: Secondary | ICD-10-CM | POA: Insufficient documentation

## 2021-02-07 DIAGNOSIS — I700 Atherosclerosis of aorta: Secondary | ICD-10-CM | POA: Diagnosis not present

## 2021-02-07 DIAGNOSIS — F419 Anxiety disorder, unspecified: Secondary | ICD-10-CM

## 2021-02-07 DIAGNOSIS — R7309 Other abnormal glucose: Secondary | ICD-10-CM

## 2021-02-07 NOTE — Progress Notes (Signed)
MEDICARE WELLNESS AND FOLLOW UP  Assessment:   Encounter for Medicare annual wellness exam Due annually  Atherosclerosis of aorta ([place]) - Ct scan 01/07/2020 Control blood pressure, cholesterol, glucose, increase exercise.   Recurrent major depressive disorder, in full remission ([place]) Remission with wellbutrin, continue medication  Former smoker (30 pack year history, quit 2011) -lung cancer screening with low dose CT discussed as recommended by guidelines based on age, number of pack year history.  Discussed risks of screening including but not limited to false positives on xray, further testing or consultation with specialist, and possible false negative CT as well. Understanding expressed and wishes to proceed with CT testing. Order placed.  -     CT CHEST LUNG CANCER SCREENING LOW DOSE WO CONTRAST; Future  Emphysema ([place]) Former smoker, per imaging, doing well with mucinex PRN  Anxiety controlled  Mixed hyperlipidemia -continue medications, check lipids, decrease fatty foods, increase activity.  - discussed normal LDL <100, low risk <70 - she would like to aim for low risk goal due to smoking/family history, wants to work on lifestyle - Lipid panel - deferred last <90 days  Abnormal glucose (prediabetes) Discussed disease and risks Discussed diet/exercise, weight management  Check A1C q77m; defer today   Rosacea Follows with Derm  Hx of Ischemic colitis Controlled without recent episodes, avoid triggers, continue follow up GI as recommended  Vitamin D deficiency Continue supplement  Medication management CBC, CMP/GFR, mangesium PRM  Bilateral low back pain without sciatica Controlled, continue gabapentin PRN  Labile htn Recently fairly controlled by lifestyle  Monitor blood pressure at home; call if consistently over 130/80 Continue DASH diet.   Reminder to go to the ER if any CP, SOB, nausea, dizziness, severe HA, changes vision/speech, left arm numbness and  tingling and jaw pain.   Future Appointments  Date Time Provider [place]  02/07/2021  1:45 PM Scot Jun B, PT [facility] None  02/13/2021  1:45 PM Girtha Rm, PT [facility] None  02/15/2021  1:00 PM Garald Balding, MD [facility] None  02/15/2021  1:45 PM Scot Jun B, PT [facility] None  02/20/2021  1:45 PM Scot Jun B, PT [facility] None  02/22/2021  1:45 PM Girtha Rm, PT [facility] None  02/27/2021  1:45 PM Girtha Rm, PT [facility] None  03/01/2021  1:45 PM Girtha Rm, PT [facility] None  06/14/2021 11:00 AM Unk Pinto, MD [facility] None  02/07/2022 11:30 AM Liane Comber, NP [facility] None      Plan:   During the course of the visit the patient was educated and counseled about appropriate screening and preventive services including:   Pneumococcal vaccine  Prevnar 13 Influenza vaccine Td vaccine Screening electrocardiogram Bone densitometry screening Colorectal cancer screening Diabetes screening Glaucoma screening Nutrition counseling  Advanced directives: requested   Over 30 minutes of exam, counseling, chart review, and critical decision making was performed Future Appointments  Date Time Provider [place]  02/07/2021  1:45 PM Scot Jun B, PT [facility] None  02/13/2021  1:45 PM Girtha Rm, PT [facility] None  02/15/2021  1:00 PM Garald Balding, MD [facility] None  02/15/2021  1:45 PM Girtha Rm, PT [facility] None  02/20/2021  1:45 PM Girtha Rm, PT [facility] None  02/22/2021  1:45 PM Girtha Rm, PT [facility] None  02/27/2021  1:45 PM Girtha Rm, PT [facility] None  03/01/2021  1:45 PM Girtha Rm, PT [facility] None  06/14/2021 11:00 AM Unk Pinto, MD Georgina Quint  None  02/07/2022 11:30 AM Liane Comber, NP [facility] None      Subjective:   Kathryn Lamb is a 70 y.o. female who presents for wellness and 3 month follow up on hypertension, prediabetes, hyperlipidemia, vitamin D def.   Mother passed after  hospice in 201, she is on wellbutrin 300 mg and feels she is doing well.   She is a former smoker, quit in 2010 with 30 pack/year smoking history. She had low dose lung cancer screening CT 12/2019 which showed emphysematous changes  She has R shoulder arthroscopic rotator cuff repair by Dr. Garnette Czech 12/21/2020, doing well, PT twice a week, has follow up in a few weeks. Currently in a sling.  BMI is Body mass index is 26.66 kg/m., she has been working on diet and exercise, will walk 20 min 3 days a week, working to increase that.  Wt Readings from Last 3 Encounters:  02/07/21 150 lb 8 oz (68.3 kg)  12/07/20 148 lb 12.8 oz (67.5 kg)  12/06/20 144 lb (65.3 kg)   Her blood pressure has been controlled at home, today their BP is BP: 124/72 She does workout. She denies chest pain, shortness of breath, dizziness.   She has aortic atherosclerosis and CAD per CT 12/2019.   She is on cholesterol medication, she increased to 80 mg last visit, and denies myalgias. Her cholesterol is at goal. The cholesterol last visit was:   Lab Results  Component Value Date   CHOL 186 12/07/2020   HDL 47 (L) 12/07/2020   LDLCALC 99 12/07/2020   TRIG 300 (H) 12/07/2020   CHOLHDL 4.0 12/07/2020   She has been working on diet and exercise for prediabetes, and denies paresthesia of the feet, polydipsia, polyuria and visual disturbances. Last A1C in the office was:  Lab Results  Component Value Date   HGBA1C 5.8 (H) 12/07/2020   Patient is on Vitamin D supplement. Lab Results  Component Value Date   VD25OH 81 12/07/2020       Medication Review   Current Outpatient Medications (Cardiovascular):    atorvastatin (LIPITOR) 80 MG tablet, TAKE 1 TABLET BY MOUTH  DAILY FOR CHOLESTEROL  Current Outpatient Medications (Respiratory):    cetirizine (ZYRTEC) 10 MG tablet, Take 10 mg by mouth daily.  Current Outpatient Medications (Analgesics):    aspirin EC 81 MG tablet, Take 81 mg by mouth daily. Swallow  whole.  Current Outpatient Medications (Hematological):    cyanocobalamin 1000 MCG tablet, Take 1,000 mcg by mouth daily.  Current Outpatient Medications (Other):    buPROPion (WELLBUTRIN XL) 300 MG 24 hr tablet, TAKE 1 TABLET BY MOUTH  EVERY MORNING FOR MOOD,  FOCUS AND CONCENTRATION   Cholecalciferol (VITAMIN D) 125 MCG (5000 UT) CAPS, Take 1 capsule Daily   diclofenac Sodium (VOLTAREN) 1 % GEL, Apply  2 grams   2 x /day  1 to Painful Joint   famotidine (PEPCID) 40 MG tablet, TAKE 1 TABLET BY MOUTH  DAILY FOR ACID REFLUX AND  INDIGESTION   Flaxseed, Linseed, (FLAXSEED OIL PO), Take 1,300 mg by mouth.   gabapentin (NEURONTIN) 100 MG capsule, TAKE 2 CAPSULES BY MOUTH 3  TIMES DAILY AS NEEDED FOR  PAIN   Multiple Vitamin (MULTIVITAMIN WITH MINERALS) TABS tablet, Take 1 tablet by mouth. Takes 3 times per week. M,W,F   Probiotic Product (PROBIOTIC DAILY PO), Take by mouth daily.  Current Problems (verified) Patient Active Problem List   Diagnosis Date Noted   Complete tear of right rotator  cuff 01/18/2021   Labile hypertension 05/28/2020   Osteoarthritis of right AC (acromioclavicular) joint 01/18/2020   Os acromiale of right shoulder 01/18/2020   Carpal tunnel syndrome, left upper limb 01/18/2020   Aortic atherosclerosis ([place]) by Chest CT scan 01/07/2020 01/11/2020   Emphysema lung ([place]) 01/11/2020   Vitamin D deficiency 11/16/2015   Medication management 11/16/2015   Abnormal glucose 08/22/2014   Hyperlipidemia, mixed 10/05/2013   Rosacea    Anxiety    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[place])    History of ischemic colitis     Screening Tests Immunization History  Administered Date(s) Administered   Influenza Split 05/17/2013, 05/17/2014   Influenza, High Dose Seasonal PF 05/09/2015, 06/18/2016, 05/30/2017, 06/01/2018, 08/02/2019   Influenza-Unspecified 06/03/2018   PPD Test 05/04/2014   Pneumococcal Conjugate-13 05/09/2015   Pneumococcal Polysaccharide-23 10/04/2013   Td  08/30/2002   Tdap 04/07/2012   Varicella Zoster Immune Globulin 03/15/2016   Preventative care: Last colonoscopy: 12/2017- never had polyps, GI states for her to not have another- very tortuous she needed peds scope and had a very difficult exam- will discuss other options in 10 years.  Last mammogram: 06/2020 Last pap smear/pelvic exam: 2014, remote hysterectomy, DONE DEXA: 03/2019 T -1.5 - declines this year, plan to get in 2023, lifestyle discussed  Prior vaccinations: TD or Tdap: 2013  Influenza: 2021 Pneumococcal: 09/2013 Prevnar13: 2016 Shingles/Zostavax: 2017, ask insurance about shingrix  Covid 19: 2/2, moderna, declines booster  Names of Other Physician/Practitioners you currently use: 1. [facility]- here for primary care 2. Dr. Joya San, eye doctor, last visit 2021, glasses, goes annually 3. Dr. Leonides Sake, dentist, last visit 2022, goes q85m 4. Dr. Nevada Crane, derm, last 2020, looking for new provider  Patient Care Team: Unk Pinto, MD as PCP - General (Internal Medicine) Richmond Campbell, MD as Consulting Physician (Gastroenterology) Newman Pies, MD as Consulting Physician (Neurosurgery) Allyn Kenner, MD as Consulting Physician (Dermatology)  Allergies Allergies  Allergen Reactions   Doxycycline    Erythromycin    Flagyl [Metronidazole]    Penicillins    Sulfa Antibiotics     SURGICAL HISTORY She  has a past surgical history that includes Abdominal hysterectomy; Tubal ligation; Lumbar laminectomy (2000); Carpal tunnel release (Right, 2004); Appendectomy; Microdiscectomy lumbar; and Shoulder arthroscopy with rotator cuff repair (Right, 12/21/2020).   FAMILY HISTORY Her family history includes Alcohol abuse in her father; Cancer in her father; Diabetes in her father and sister; Heart disease in her mother; Hypertension in her mother; Stroke in her mother.   SOCIAL HISTORY She  reports that she quit smoking about 12 years ago.  Her smoking use included cigarettes. She has a 30.00 pack-year smoking history. She has never used smokeless tobacco. She reports that she does not drink alcohol and does not use drugs.  MEDICARE WELLNESS OBJECTIVES: Physical activity:   Cardiac risk factors:   Depression/mood screen:   Depression screen [facility] 2/9 12/06/2020  Decreased Interest 0  Down, Depressed, Hopeless 0  PHQ - 2 Score 0    ADLs:  In your present state of health, do you have any difficulty performing the following activities: 12/06/2020 05/28/2020  Hearing? N N  Vision? N N  Difficulty concentrating or making decisions? N N  Walking or climbing stairs? N N  Dressing or bathing? N N  Doing errands, shopping? N N  Some recent data might be hidden     Cognitive Testing  Alert? Yes  Normal Appearance?Yes  Oriented to person? Yes  Place? Yes   Time? Yes  Recall of three objects?  Yes  Can perform simple calculations? Yes  Displays appropriate judgment?Yes  Can read the correct time from a watch face?Yes  EOL planning: Does Patient Have a Medical Advance Directive?: No Would patient like information on creating a medical advance directive?: Yes (MAU/Ambulatory/Procedural Areas - Information given)    Review of Systems  Constitutional: Negative.  Negative for malaise/fatigue and weight loss.  HENT: Negative.  Negative for hearing loss and tinnitus.   Eyes: Negative.  Negative for blurred vision and double vision.  Respiratory: Negative.  Negative for cough, sputum production, shortness of breath and wheezing.   Cardiovascular: Negative.  Negative for chest pain, palpitations, orthopnea, claudication, leg swelling and PND.  Gastrointestinal: Negative.  Negative for abdominal pain, blood in stool, constipation, diarrhea, heartburn, melena, nausea and vomiting.  Genitourinary: Negative.   Musculoskeletal:  Positive for joint pain (R shoulder). Negative for falls and myalgias.  Skin: Negative.  Negative for rash.   Neurological: Negative.  Negative for dizziness, tingling, sensory change, weakness and headaches.  Endo/Heme/Allergies: Negative.  Negative for polydipsia.  Psychiatric/Behavioral: Negative.  Negative for depression, memory loss, substance abuse and suicidal ideas. The patient is not nervous/anxious and does not have insomnia.   All other systems reviewed and are negative.   Objective:   Today's Vitals   02/07/21 1130  BP: 124/72  Pulse: 66  Temp: 97.7 F (36.5 C)  SpO2: 93%  Weight: 150 lb 8 oz (68.3 kg)    General appearance: alert, no distress, WD/WN,  female HEENT: normocephalic, sclerae anicteric, TMs pearly, nares patent, no discharge or erythema, pharynx normal Oral cavity: MMM, no lesions Neck: supple, no lymphadenopathy, no thyromegaly, no masses Heart: RRR, normal S1, S2, systolic murmur with radiation to carotids and best heard at RSB Lungs: CTA bilaterally, no wheezes, rhonchi, or rales Abdomen: +bs, soft, non tender, non distended, no masses, no hepatomegaly, no splenomegaly Musculoskeletal: nontender, no swelling, no obvious deformity; R shoulder in sling Extremities: no edema, no cyanosis, no clubbing Pulses: 2+ symmetric, upper and lower extremities, normal cap refill Neurological: alert, oriented x 3, CN2-12 intact, strength normal upper extremities and lower extremities, sensation normal throughout, DTRs 2+ throughout, no cerebellar signs, gait normal Psychiatric: normal affect, behavior normal, pleasant    Medicare Attestation I have personally reviewed: The patient's medical and social history Their use of alcohol, tobacco or illicit drugs Their current medications and supplements The patient's functional ability including ADLs,fall risks, home safety risks, cognitive, and hearing and visual impairment Diet and physical activities Evidence for depression or mood disorders  The patient's weight, height, BMI, and visual acuity have been recorded in the  chart.  I have made referrals, counseling, and provided education to the patient based on review of the above and I have provided the patient with a written personalized care plan for preventive services.     Izora Ribas, NP   02/07/2021

## 2021-02-07 NOTE — Patient Instructions (Signed)
Access Code: [email or web address] URL: [email or web address] Date: 02/07/2021 Prepared by: Scot Jun  Exercises Seated Scapular Retraction - 2 x daily - 7 x weekly - 2 sets - 10 reps - 5 hold Supine Shoulder Flexion Extension AAROM with [place] - 2 x daily - 7 x weekly - 3 sets - 10 reps - 2 hold Supine Shoulder External Rotation in 45 Degrees Abduction AAROM with Dowel (Mirrored) - 2-3 x daily - 7 x weekly - 1-2 sets - 10 reps - 5 hold Standing Shoulder Abduction AAROM with Dowel (Mirrored) - 2 x daily - 7 x weekly - 3 sets - 10 reps - 2 hold

## 2021-02-07 NOTE — Therapy (Signed)
[facility] [address] [place], [place], [address] Phone: [phone]   Fax:  [phone]  Physical Therapy Treatment  Patient Details  Name: Kathryn Lamb MRN: [id number] Date of Birth: [birth year] Referring Provider (PT): Dr. Durward Fortes   Encounter Date: 02/07/2021   PT End of Session - 02/07/21 1342     Visit Number 5    Number of Visits 18    Date for PT Re-Evaluation 03/13/21    Authorization Type [employer] Medicare    Progress Note Due on Visit 10    PT Start Time 1330    PT Stop Time 1420    PT Time Calculation (min) 50 min    Activity Tolerance Patient tolerated treatment well    Behavior During Therapy [facility] for tasks assessed/performed             Past Medical History:  Diagnosis Date   Anxiety    Depression    Diverticulitis    Ischemic colitis ([place])    Mixed hyperlipidemia 10/05/2013   Rosacea     Past Surgical History:  Procedure Laterality Date   ABDOMINAL HYSTERECTOMY     APPENDECTOMY     CARPAL TUNNEL RELEASE Right 2004   LUMBAR LAMINECTOMY  2000   MICRODISCECTOMY LUMBAR     SHOULDER ARTHROSCOPY WITH ROTATOR CUFF REPAIR Right 12/21/2020   Dr. Durward Fortes   TUBAL LIGATION      There were no vitals filed for this visit.   Subjective Assessment - 02/07/21 1339     Subjective Pt. indicated feeling some anxiety and discomfort c supine assisted flexion end range but otherwise doing ok.    Limitations House hold activities;Sitting;Lifting    Patient Stated Goals Reduce pain    Currently in Pain? No/denies    Pain Score 0-No pain    Pain Onset More than a month ago                [facility] PT Assessment - 02/07/21 0001       Assessment   Medical Diagnosis Rt shoulder pain s/p RTC repair    Referring Provider (PT) Dr. Durward Fortes    Onset Date/Surgical Date 12/21/20    Hand Dominance Right      AROM   Right Shoulder Flexion 140 Degrees   in supine   Right Shoulder ABduction 120 Degrees   in supine   Right  Shoulder Internal Rotation 80 Degrees   in supine   Right Shoulder External Rotation 62 Degrees                           [facility] Adult PT Treatment/Exercise - 02/07/21 0001       Shoulder Exercises: Supine   External Rotation Right;10 reps   5 sec hold x 10 in 45 deg abduction c wand   Flexion AAROM;Both   1 lb bar 2 x 10     Shoulder Exercises: Standing   Row Both   2 x 10 5 sec hold   Theraband Level (Shoulder Row) Level 3 (Green)    Other Standing Exercises wand abduction PROM 2 x 10      Shoulder Exercises: ROM/Strengthening   UBE (Upper Arm Bike) 3 mins fwd/back UE only lvl 2.0 AAROM      Vasopneumatic   Number Minutes Vasopneumatic  10 minutes    Vasopnuematic Location  Shoulder   Rt   Vasopneumatic Pressure Medium    Vasopneumatic Temperature  34  PT Education - 02/07/21 1340     Education Details HEP progression    Person(s) Educated Patient    Methods Explanation;Demonstration;Verbal cues;Handout    Comprehension Returned demonstration;Verbalized understanding              PT Short Term Goals - 01/17/21 1412       PT SHORT TERM GOAL #1   Title Patient will demonstrate independent use of home exercise program to maintain progress from in clinic treatments.    Time 3    Period Weeks    Status Achieved    Target Date 01/23/21               PT Long Term Goals - 01/02/21 1523       PT LONG TERM GOAL #1   Title Patient will demonstrate/report pain at worst less than or equal to 2/10 to facilitate minimal limitation in daily activity secondary to pain symptoms.    Time 10    Period Weeks    Status New    Target Date 03/13/21      PT LONG TERM GOAL #2   Title Patient will demonstrate independent use of home exercise program to facilitate ability to maintain/progress functional gains from skilled physical therapy services.    Time 10    Period Weeks    Status New    Target Date 03/13/21      PT LONG  TERM GOAL #3   Title Patient will demonstrate Rt [place] joint mobility WFL to facilitate usual self care, dressing, reaching overhead at PLOF s limitation due to symptoms.    Time 10    Period Weeks    Status New    Target Date 03/13/21      PT LONG TERM GOAL #4   Title Patient will demonstrate Rt UE MMT 5/5 throughout to facilitate usual lifting, carrying in functional activity to PLOF s limitation.    Time 10    Period Weeks    Status New    Target Date 03/13/21      PT LONG TERM GOAL #5   Title Pt. will demonstrate FOTO outcome > or = 61 to indicated reduced disability due to condition.    Time 10    Period Weeks    Status New    Target Date 03/13/21                   Plan - 02/07/21 1405     Clinical Impression Statement Pt. was able to initiate AAROM activity today to allow progression in HEP as noted and allowed as she has reached end of 6 week period of PROM per MD referral.  Pt. still has some end range guarding and discomfort, most noted in abduction.  Will plan to progress AAROM to AROM as tolerated.    Examination-Activity Limitations Sleep;Bathing;Caring for Others;Carry;Toileting;Dressing;Hygiene/Grooming;Lift;Reach Overhead    Examination-Participation Restrictions Driving;Community Activity;Cleaning;Laundry;Yard Work;Meal Prep    Stability/Clinical Decision Making Stable/Uncomplicated    Rehab Potential Good    PT Frequency Other (comment)   1x/week for 4 weeks then 2x/week for POC of 10 weeks   PT Duration Other (comment)   10 weeks   PT Treatment/Interventions ADLs/Self Care Home Management;Cryotherapy;Iontophoresis 4mg /ml Dexamethasone;Moist Heat;Traction;Balance training;Therapeutic exercise;Therapeutic activities;Functional mobility training;Electrical Stimulation;Stair training;Gait training;DME Instruction;Ultrasound;Neuromuscular re-education;Patient/family education;Passive range of motion;Spinal Manipulations;Joint Manipulations;Dry  needling;Taping;Vasopneumatic Device;Manual techniques    PT Next Visit Plan AAROM, AROM in gravity reduced positioning.    PT Home Exercise Plan [email or web address]    Consulted  and Agree with Plan of Care Patient             Patient will benefit from skilled therapeutic intervention in order to improve the following deficits and impairments:  Decreased endurance, Pain, Impaired UE functional use, Decreased strength, Decreased activity tolerance, Decreased range of motion, Impaired flexibility, Impaired perceived functional ability, Decreased mobility, Increased edema  Visit Diagnosis: Chronic right shoulder pain  Muscle weakness (generalized)  Localized edema     Problem List Patient Active Problem List   Diagnosis Date Noted   Former smoker (30 pack/year, quit 2011) 02/07/2021   Complete tear of right rotator cuff 01/18/2021   Labile hypertension 05/28/2020   Osteoarthritis of right AC (acromioclavicular) joint 01/18/2020   Os acromiale of right shoulder 01/18/2020   Carpal tunnel syndrome, left upper limb 01/18/2020   Aortic atherosclerosis ([place]) by Chest CT scan 01/07/2020 01/11/2020   Emphysema lung ([place]) 01/11/2020   Vitamin D deficiency 11/16/2015   Medication management 11/16/2015   Abnormal glucose 08/22/2014   Hyperlipidemia, mixed 10/05/2013   Rosacea    Anxiety    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[place])    History of ischemic colitis    Scot Jun, PT, DPT, OCS, ATC 02/07/21  2:09 PM    [facility] [address] [place], [place], [address] Phone: [phone]   Fax:  [phone]  Name: Kathryn Lamb MRN: [id number] Date of Birth: Jan 18, 1951

## 2021-02-07 NOTE — Patient Instructions (Signed)
    Zoster Vaccine, Recombinant injection What is this medication? ZOSTER VACCINE (ZOS ter vak SEEN) is a vaccine used to reduce the risk of getting shingles. This vaccine is not used to treat shingles or nerve pain fromshingles. This medicine may be used for other purposes; ask your health care provider orpharmacist if you have questions. COMMON BRAND NAME(S): [facility] What should I tell my care team before I take this medication? They need to know if you have any of these conditions: cancer immune system problems an unusual or allergic reaction to Zoster vaccine, other medications, foods, dyes, or preservatives pregnant or trying to get pregnant breast-feeding How should I use this medication? This vaccine is injected into a muscle. It is given by a health care provider. A copy of Vaccine Information Statements will be given before each vaccination. Be sure to read this information carefully each time. This sheet may changeoften. Talk to your health care provider about the use of this vaccine in children.This vaccine is not approved for use in children. Overdosage: If you think you have taken too much of this medicine contact apoison control center or emergency room at once. NOTE: This medicine is only for you. Do not share this medicine with others. What if I miss a dose? Keep appointments for follow-up (booster) doses. It is important not to miss your dose. Call your health care provider if you are unable to keep anappointment. What may interact with this medication? medicines that suppress your immune system medicines to treat cancer steroid medicines like prednisone or cortisone This list may not describe all possible interactions. Give your health care provider a list of all the medicines, herbs, non-prescription drugs, or dietary supplements you use. Also tell them if you smoke, drink alcohol, or use illegaldrugs. Some items may interact with your medicine. What should I watch for  while using this medication? Visit your health care provider regularly. This vaccine, like all vaccines, may not fully protect everyone. What side effects may I notice from receiving this medication? Side effects that you should report to your doctor or health care professionalas soon as possible: allergic reactions (skin rash, itching or hives; swelling of the face, lips, or tongue) trouble breathing Side effects that usually do not require medical attention (report these toyour doctor or health care professional if they continue or are bothersome): chills headache fever nausea pain, redness, or irritation at site where injected tiredness vomiting This list may not describe all possible side effects. Call your doctor for medical advice about side effects. You may report side effects to FDA at1-800-FDA-1088. Where should I keep my medication? This vaccine is only given by a health care provider. It will not be stored athome. NOTE: This sheet is a summary. It may not cover all possible information. If you have questions about this medicine, talk to your doctor, pharmacist, orhealth care provider.  2022 Elsevier/Gold Standard (2019-09-03 16:23:07)

## 2021-02-13 ENCOUNTER — Encounter: Payer: Medicare Other | Admitting: Rehabilitative and Restorative Service Providers"

## 2021-02-15 ENCOUNTER — Ambulatory Visit (INDEPENDENT_AMBULATORY_CARE_PROVIDER_SITE_OTHER): Payer: Medicare Other | Admitting: Orthopaedic Surgery

## 2021-02-15 ENCOUNTER — Other Ambulatory Visit: Payer: Self-pay

## 2021-02-15 ENCOUNTER — Encounter: Payer: Medicare Other | Admitting: Rehabilitative and Restorative Service Providers"

## 2021-02-15 ENCOUNTER — Encounter: Payer: Medicare Other | Admitting: Physical Therapy

## 2021-02-15 ENCOUNTER — Encounter: Payer: Self-pay | Admitting: Orthopaedic Surgery

## 2021-02-15 DIAGNOSIS — S46011D Strain of muscle(s) and tendon(s) of the rotator cuff of right shoulder, subsequent encounter: Secondary | ICD-10-CM

## 2021-02-15 NOTE — Progress Notes (Signed)
Office Visit Note   Patient: Kathryn Lamb           Date of Birth: Aug 19, 1950           MRN: [id number] Visit Date: 02/15/2021              Requested by: Unk Pinto, [place],  [place] [address] PCP: Unk Pinto, MD   Assessment & Plan: Visit Diagnoses:  1. Traumatic complete tear of right rotator cuff, subsequent encounter     Plan: 2 months status post rotator cuff tear repair right shoulder with biceps tenodesis.  Doing quite well.  Has achieved full overhead motion and and continues to go to therapy twice a week.  No longer using a sling.  Feeling better than she did before surgery.  Would like to see her in a month and have her continue with her exercises both at home and in therapy.  Follow-Up Instructions: Return in about 1 month (around 03/18/2021).   Orders:  No orders of the defined types were placed in this encounter.  No orders of the defined types were placed in this encounter.     Procedures: No procedures performed   Clinical Data: No additional findings.   Subjective: Chief Complaint  Patient presents with   Right Shoulder - Follow-up, Pain    DOS: 12/21/2020    HPI  Review of Systems   Objective: Vital Signs: There were no vitals taken for this visit.  Physical Exam  Ortho Exam right shoulder with full active overhead flexion and 90 degrees of abduction.  No impingement.  Incisions of healed nicely.  Good grip and release.  Biceps tendon was tenodesed but the muscle appears to be in relatively good position.  Specialty Comments:  No specialty comments available.  Imaging: No results found.   PMFS History: Patient Active Problem List   Diagnosis Date Noted   Former smoker (30 pack/year, quit 2011) 02/07/2021   Complete tear of right rotator cuff 01/18/2021   Labile hypertension 05/28/2020   Osteoarthritis of right AC (acromioclavicular) joint 01/18/2020   Os acromiale of right shoulder 01/18/2020    Carpal tunnel syndrome, left upper limb 01/18/2020   Aortic atherosclerosis ([place]) by Chest CT scan 01/07/2020 01/11/2020   Emphysema lung ([place]) 01/11/2020   Vitamin D deficiency 11/16/2015   Medication management 11/16/2015   Abnormal glucose 08/22/2014   Hyperlipidemia, mixed 10/05/2013   Rosacea    Anxiety    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[place])    History of ischemic colitis    Past Medical History:  Diagnosis Date   Anxiety    Depression    Diverticulitis    Ischemic colitis ([place])    Mixed hyperlipidemia 10/05/2013   Rosacea     Family History  Problem Relation Age of Onset   Diabetes Father    Alcohol abuse Father    Cancer Father        prostate, lung   Hypertension Mother    Stroke Mother    Heart disease Mother    Diabetes Sister     Past Surgical History:  Procedure Laterality Date   ABDOMINAL HYSTERECTOMY     APPENDECTOMY     CARPAL TUNNEL RELEASE Right 2004   LUMBAR LAMINECTOMY  2000   MICRODISCECTOMY LUMBAR     SHOULDER ARTHROSCOPY WITH ROTATOR CUFF REPAIR Right 12/21/2020   Dr. Durward Fortes   TUBAL LIGATION     Social History   Occupational History  Not on file  Tobacco Use   Smoking status: Former    Packs/day: 1.00    Years: 30.00    Pack years: 30.00    Types: Cigarettes    Quit date: 10/04/2008    Years since quitting: 12.3   Smokeless tobacco: Never  Vaping Use   Vaping Use: Never used  Substance and Sexual Activity   Alcohol use: No   Drug use: No   Sexual activity: Not on file     Garald Balding, MD   Note - This record has been created using [employer].  Chart creation errors have been sought, but may not always  have been located. Such creation errors do not reflect on  the standard of medical care.

## 2021-02-15 NOTE — Progress Notes (Deleted)
   Office Visit Note   Patient: Kathryn Lamb           Date of Birth: November 24, 1950           MRN: [id number] Visit Date:               Requested by: Unk Pinto, MD [address] [place],  [place] [address] PCP: Unk Pinto, MD   Assessment & Plan: Visit Diagnoses:  1. Traumatic complete tear of right rotator cuff, subsequent encounter     Plan: ***  Follow-Up Instructions: Return in about 1 month (around 03/18/2021).   Orders:  No orders of the defined types were placed in this encounter.  No orders of the defined types were placed in this encounter.     Procedures: No procedures performed   Clinical Data: No additional findings.   Subjective: Chief Complaint  Patient presents with  . Right Shoulder - Follow-up, Pain    DOS: 12/21/2020    HPI  Review of Systems   Objective: Vital Signs: There were no vitals taken for this visit.  Physical Exam  Ortho Exam  Specialty Comments:  No specialty comments available.  Imaging: No results found.   PMFS History: Patient Active Problem List   Diagnosis Date Noted  . Former smoker (30 pack/year, quit 2011) 02/07/2021  . Complete tear of right rotator cuff 01/18/2021  . Labile hypertension 05/28/2020  . Osteoarthritis of right AC (acromioclavicular) joint 01/18/2020  . Os acromiale of right shoulder 01/18/2020  . Carpal tunnel syndrome, left upper limb 01/18/2020  . Aortic atherosclerosis ([place]) by Chest CT scan 01/07/2020 01/11/2020  . Emphysema lung ([place]) 01/11/2020  . Vitamin D deficiency 11/16/2015  . Medication management 11/16/2015  . Abnormal glucose 08/22/2014  . Hyperlipidemia, mixed 10/05/2013  . Rosacea   . Anxiety   . 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 History of ischemic colitis    Past Medical History:  Diagnosis Date  . Anxiety   . Depression   . Diverticulitis   . Ischemic colitis ([place])   . Mixed hyperlipidemia 10/05/2013  . Rosacea     Family  History  Problem Relation Age of Onset  . Diabetes Father   . Alcohol abuse Father   . Cancer Father        prostate, lung  . Hypertension Mother   . Stroke Mother   . Heart disease Mother   . Diabetes Sister     Past Surgical History:  Procedure Laterality Date  . ABDOMINAL HYSTERECTOMY    . APPENDECTOMY    . CARPAL TUNNEL RELEASE Right 2004  . LUMBAR LAMINECTOMY  2000  . MICRODISCECTOMY LUMBAR    . SHOULDER ARTHROSCOPY WITH ROTATOR CUFF REPAIR Right 12/21/2020   Dr. Durward Fortes  . TUBAL LIGATION     Social History   Occupational History  . Not on file  Tobacco Use  . Smoking status: Former    Packs/day: 1.00    Years: 30.00    Pack years: 30.00    Types: Cigarettes    Quit date: 10/04/2008    Years since quitting: 12.3  . Smokeless tobacco: Never  Vaping Use  . Vaping Use: Never used  Substance and Sexual Activity  . Alcohol use: No  . Drug use: No  . Sexual activity: Not on file

## 2021-02-20 ENCOUNTER — Ambulatory Visit: Payer: Medicare Other | Admitting: Rehabilitative and Restorative Service Providers"

## 2021-02-20 ENCOUNTER — Other Ambulatory Visit: Payer: Self-pay

## 2021-02-20 ENCOUNTER — Encounter: Payer: Self-pay | Admitting: Rehabilitative and Restorative Service Providers"

## 2021-02-20 DIAGNOSIS — R600 Localized edema: Secondary | ICD-10-CM

## 2021-02-20 DIAGNOSIS — G8929 Other chronic pain: Secondary | ICD-10-CM

## 2021-02-20 DIAGNOSIS — M6281 Muscle weakness (generalized): Secondary | ICD-10-CM

## 2021-02-20 DIAGNOSIS — M25511 Pain in right shoulder: Secondary | ICD-10-CM | POA: Diagnosis not present

## 2021-02-20 NOTE — Therapy (Signed)
[facility] Physical Therapy [address] [place], [place], [address] Phone: [phone]   Fax:  [phone]  Physical Therapy Treatment  Patient Details  Name: Kathryn Lamb MRN: [id number] Date of Birth: 1951/05/27 Referring Provider (PT): Dr. Durward Fortes   Encounter Date: 02/20/2021   PT End of Session - 02/20/21 1348     Visit Number 6    Number of Visits 18    Date for PT Re-Evaluation 03/13/21    Authorization Type [employer] Medicare    Progress Note Due on Visit 10    PT Start Time 1345    PT Stop Time 1424    PT Time Calculation (min) 39 min    Activity Tolerance Patient tolerated treatment well    Behavior During Therapy [facility] for tasks assessed/performed             Past Medical History:  Diagnosis Date   Anxiety    Depression    Diverticulitis    Ischemic colitis ([place])    Mixed hyperlipidemia 10/05/2013   Rosacea     Past Surgical History:  Procedure Laterality Date   ABDOMINAL HYSTERECTOMY     APPENDECTOMY     CARPAL TUNNEL RELEASE Right 2004   LUMBAR LAMINECTOMY  2000   MICRODISCECTOMY LUMBAR     SHOULDER ARTHROSCOPY WITH ROTATOR CUFF REPAIR Right 12/21/2020   Dr. Durward Fortes   TUBAL LIGATION      There were no vitals filed for this visit.   Subjective Assessment - 02/20/21 1348     Subjective Pt. indicated a little pain c exercise movement.  No pain at rest.  Exercises are getting easier.    Limitations House hold activities;Sitting;Lifting    Patient Stated Goals Reduce pain    Currently in Pain? No/denies    Pain Score 0-No pain    Pain Onset More than a month ago                               [facility] Adult PT Treatment/Exercise - 02/20/21 0001       Shoulder Exercises: Supine   Flexion AAROM;15 reps   2 second hold x 15 2 lb bar   Other Supine Exercises active flexion full range Rt to fatigue x 26 1 lb      Shoulder Exercises: Sidelying   ABduction Right   3 x 10     Shoulder Exercises: Standing    Extension Both;20 reps    Theraband Level (Shoulder Extension) Level 3 (Green)    Row Both;15 reps   5 sec hold x 15   Theraband Level (Shoulder Row) Level 3 (Green)    Other Standing Exercises ER walk out holds c arm at side 5 sec hold x 15 red band      Shoulder Exercises: ROM/Strengthening   UBE (Upper Arm Bike) 3 mins fwd/back UE only lvl 3.0 AAROM                      PT Short Term Goals - 01/17/21 1412       PT SHORT TERM GOAL #1   Title Patient will demonstrate independent use of home exercise program to maintain progress from in clinic treatments.    Time 3    Period Weeks    Status Achieved    Target Date 01/23/21               PT Long Term Goals -  02/20/21 1426       PT LONG TERM GOAL #1   Title Patient will demonstrate/report pain at worst less than or equal to 2/10 to facilitate minimal limitation in daily activity secondary to pain symptoms.    Time 10    Period Weeks    Status On-going    Target Date 03/13/21      PT LONG TERM GOAL #2   Title Patient will demonstrate independent use of home exercise program to facilitate ability to maintain/progress functional gains from skilled physical therapy services.    Time 10    Period Weeks    Status On-going    Target Date 03/13/21      PT LONG TERM GOAL #3   Title Patient will demonstrate Rt [place] joint mobility WFL to facilitate usual self care, dressing, reaching overhead at PLOF s limitation due to symptoms.    Time 10    Period Weeks    Status On-going    Target Date 03/13/21      PT LONG TERM GOAL #4   Title Patient will demonstrate Rt UE MMT 5/5 throughout to facilitate usual lifting, carrying in functional activity to PLOF s limitation.    Time 10    Period Weeks    Status On-going    Target Date 03/13/21      PT LONG TERM GOAL #5   Title Pt. will demonstrate FOTO outcome > or = 61 to indicated reduced disability due to condition.    Time 10    Period Weeks    Status On-going     Target Date 03/13/21                   Plan - 02/20/21 1414     Clinical Impression Statement Continued transition toward AROM and strengthening performed today, primarily in gravity reduced positioning c good overall performance.  Pt. to continue to benefit from skilled PT services to improve Rt arm function.    Examination-Activity Limitations Sleep;Bathing;Caring for Others;Carry;Toileting;Dressing;Hygiene/Grooming;Lift;Reach Overhead    Examination-Participation Restrictions Driving;Community Activity;Cleaning;Laundry;Yard Work;Meal Prep    Stability/Clinical Decision Making Stable/Uncomplicated    Rehab Potential Good    PT Frequency Other (comment)   1x/week for 4 weeks then 2x/week for POC of 10 weeks   PT Duration Other (comment)   10 weeks   PT Treatment/Interventions ADLs/Self Care Home Management;Cryotherapy;Iontophoresis 4mg /ml Dexamethasone;Moist Heat;Traction;Balance training;Therapeutic exercise;Therapeutic activities;Functional mobility training;Electrical Stimulation;Stair training;Gait training;DME Instruction;Ultrasound;Neuromuscular re-education;Patient/family education;Passive range of motion;Spinal Manipulations;Joint Manipulations;Dry needling;Taping;Vasopneumatic Device;Manual techniques    PT Next Visit Plan AROM gravity reduced positioning c progression in resistance as tolerated.    PT Home Exercise Plan [email or web address]    Consulted and Agree with Plan of Care Patient             Patient will benefit from skilled therapeutic intervention in order to improve the following deficits and impairments:  Decreased endurance, Pain, Impaired UE functional use, Decreased strength, Decreased activity tolerance, Decreased range of motion, Impaired flexibility, Impaired perceived functional ability, Decreased mobility, Increased edema  Visit Diagnosis: Chronic right shoulder pain  Muscle weakness (generalized)  Localized edema     Problem List Patient Active  Problem List   Diagnosis Date Noted   Former smoker (30 pack/year, quit 2011) 02/07/2021   Complete tear of right rotator cuff 01/18/2021   Labile hypertension 05/28/2020   Osteoarthritis of right AC (acromioclavicular) joint 01/18/2020   Os acromiale of right shoulder 01/18/2020   Carpal tunnel syndrome, left upper limb 01/18/2020  Aortic atherosclerosis ([place]) by Chest CT scan 01/07/2020 01/11/2020   Emphysema lung ([place]) 01/11/2020   Vitamin D deficiency 11/16/2015   Medication management 11/16/2015   Abnormal glucose 08/22/2014   Hyperlipidemia, mixed 10/05/2013   Rosacea    Anxiety    Major depression in full remission [facility])    History of ischemic colitis    Scot Jun, PT, DPT, OCS, ATC 02/20/21  2:27 PM    [facility] [address] [place], [place], [address] Phone: [phone]   Fax:  [phone]  Name: MARISHA RENIER MRN: [id number] Date of Birth: [birth year]

## 2021-02-22 ENCOUNTER — Other Ambulatory Visit: Payer: Self-pay

## 2021-02-22 ENCOUNTER — Encounter: Payer: Self-pay | Admitting: Rehabilitative and Restorative Service Providers"

## 2021-02-22 ENCOUNTER — Ambulatory Visit: Payer: Medicare Other | Admitting: Rehabilitative and Restorative Service Providers"

## 2021-02-22 DIAGNOSIS — G8929 Other chronic pain: Secondary | ICD-10-CM

## 2021-02-22 DIAGNOSIS — M6281 Muscle weakness (generalized): Secondary | ICD-10-CM

## 2021-02-22 DIAGNOSIS — R600 Localized edema: Secondary | ICD-10-CM

## 2021-02-22 DIAGNOSIS — M25511 Pain in right shoulder: Secondary | ICD-10-CM | POA: Diagnosis not present

## 2021-02-22 NOTE — Therapy (Signed)
[facility] Therapy [address] [place], [place], [address] Phone: [phone]   Fax:  [phone]  Physical Therapy Treatment  Patient Details  Name: Kathryn Lamb MRN: [id number] Date of Birth: 1951-04-01 Referring Provider (PT): Dr. Durward Fortes   Encounter Date: 02/22/2021   PT End of Session - 02/22/21 1351     Visit Number 7    Number of Visits 18    Date for PT Re-Evaluation 03/13/21    Authorization Type UHC Medicare    Progress Note Due on Visit 10    PT Start Time 1344    PT Stop Time 1435    PT Time Calculation (min) 51 min    Activity Tolerance Patient tolerated treatment well    Behavior During Therapy [facility] for tasks assessed/performed             Past Medical History:  Diagnosis Date   Anxiety    Depression    Diverticulitis    Ischemic colitis ([place])    Mixed hyperlipidemia 10/05/2013   Rosacea     Past Surgical History:  Procedure Laterality Date   ABDOMINAL HYSTERECTOMY     APPENDECTOMY     CARPAL TUNNEL RELEASE Right 2004   LUMBAR LAMINECTOMY  2000   MICRODISCECTOMY LUMBAR     SHOULDER ARTHROSCOPY WITH ROTATOR CUFF REPAIR Right 12/21/2020   Dr. Durward Fortes   TUBAL LIGATION      There were no vitals filed for this visit.   Subjective Assessment - 02/22/21 1351     Subjective She indicated feeling general sore in shoulder, some mild increase after exercise routine last time.  Nothing sharp indicated.    Limitations House hold activities;Sitting;Lifting    Patient Stated Goals Reduce pain    Currently in Pain? No/denies   indicated sore not pain   Pain Location Shoulder    Pain Orientation Right    Pain Descriptors / Indicators Sore    Pain Onset More than a month ago    Pain Frequency Intermittent    Aggravating Factors  after exercise    Pain Relieving Factors resting, ice                [facility] PT Assessment - 02/22/21 0001       Assessment   Medical Diagnosis Rt shoulder pain s/p RTC repair     Referring Provider (PT) Dr. Durward Fortes    Onset Date/Surgical Date 12/21/20    Hand Dominance Right      AROM   Overall AROM Comments Rt shoulder shrug noted at 80 degrees flexion elevation      Strength   Right Shoulder Flexion --   8.1, 7.5 lbs in 90 deg flexion in supine   Left Shoulder Flexion --   14.8, 15.7 lbs in 90 degrees flexion in supine                          [facility] Adult PT Treatment/Exercise - 02/22/21 0001       Neuro Re-ed    Neuro Re-ed Details  stabilizations c mild resistance all directions in 90 deg flexion in supine 15 second bouts      Shoulder Exercises: Supine   Flexion AAROM;Both   2 lb bar x 10 2 sec hold   Other Supine Exercises active full range flexion to fatigue Rt 1 lb x 26      Shoulder Exercises: Sidelying   External Rotation Right   3 x 10  External Rotation Weight (lbs) 1    ABduction Right   3 x 10     Shoulder Exercises: Standing   Extension Both   2 x 10 ish(pain noted in middle of exercise so stopped)   Theraband Level (Shoulder Extension) Level 3 (Green)    Row Both   3 x 10   Theraband Level (Shoulder Row) Level 3 (Green)      Shoulder Exercises: ROM/Strengthening   UBE (Upper Arm Bike) Lvl 3.5 fwd/back 3 mins each way UE only      Vasopneumatic   Number Minutes Vasopneumatic  10 minutes    Vasopnuematic Location  Shoulder    Vasopneumatic Pressure Medium    Vasopneumatic Temperature  34                      PT Short Term Goals - 01/17/21 1412       PT SHORT TERM GOAL #1   Title Patient will demonstrate independent use of home exercise program to maintain progress from in clinic treatments.    Time 3    Period Weeks    Status Achieved    Target Date 01/23/21               PT Long Term Goals - 02/20/21 1426       PT LONG TERM GOAL #1   Title Patient will demonstrate/report pain at worst less than or equal to 2/10 to facilitate minimal limitation in daily activity secondary to pain  symptoms.    Time 10    Period Weeks    Status On-going    Target Date 03/13/21      PT LONG TERM GOAL #2   Title Patient will demonstrate independent use of home exercise program to facilitate ability to maintain/progress functional gains from skilled physical therapy services.    Time 10    Period Weeks    Status On-going    Target Date 03/13/21      PT LONG TERM GOAL #3   Title Patient will demonstrate Rt Watson joint mobility WFL to facilitate usual self care, dressing, reaching overhead at PLOF s limitation due to symptoms.    Time 10    Period Weeks    Status On-going    Target Date 03/13/21      PT LONG TERM GOAL #4   Title Patient will demonstrate Rt UE MMT 5/5 throughout to facilitate usual lifting, carrying in functional activity to PLOF s limitation.    Time 10    Period Weeks    Status On-going    Target Date 03/13/21      PT LONG TERM GOAL #5   Title Pt. will demonstrate FOTO outcome > or = 61 to indicated reduced disability due to condition.    Time 10    Period Weeks    Status On-going    Target Date 03/13/21                   Plan - 02/22/21 1412     Clinical Impression Statement Assessed strength in flexion with dynamometer today showing deficits on Rt vs. Lt as well as observed shrug in elevation as noted.  Continued strengthening plan indicated to improve function.  Inclusion of vaso device today again due to soreness complaints and to reduce any post activity soreness/edema.    Examination-Activity Limitations Sleep;Bathing;Caring for Others;Carry;Toileting;Dressing;Hygiene/Grooming;Lift;Reach Overhead    Examination-Participation Restrictions Driving;Community Activity;Cleaning;Laundry;Yard Work;Meal Prep    [employer]  Stable/Uncomplicated    Rehab Potential Good    PT Frequency Other (comment)   1x/week for 4 weeks then 2x/week for POC of 10 weeks   PT Duration Other (comment)   10 weeks   PT Treatment/Interventions  ADLs/Self Care Home Management;Cryotherapy;Iontophoresis 4mg /ml Dexamethasone;Moist Heat;Traction;Balance training;Therapeutic exercise;Therapeutic activities;Functional mobility training;Electrical Stimulation;Stair training;Gait training;DME Instruction;Ultrasound;Neuromuscular re-education;Patient/family education;Passive range of motion;Spinal Manipulations;Joint Manipulations;Dry needling;Taping;Vasopneumatic Device;Manual techniques    PT Next Visit Plan AROM gravity reduced positioning c progression in resistance as tolerated still indicated    PT Home Exercise Plan [id number]    Consulted and Agree with Plan of Care Patient             Patient will benefit from skilled therapeutic intervention in order to improve the following deficits and impairments:  Decreased endurance, Pain, Impaired UE functional use, Decreased strength, Decreased activity tolerance, Decreased range of motion, Impaired flexibility, Impaired perceived functional ability, Decreased mobility, Increased edema  Visit Diagnosis: Chronic right shoulder pain  Muscle weakness (generalized)  Localized edema     Problem List Patient Active Problem List   Diagnosis Date Noted   Former smoker (30 pack/year, quit 2011) 02/07/2021   Complete tear of right rotator cuff 01/18/2021   Labile hypertension 05/28/2020   Osteoarthritis of right AC (acromioclavicular) joint 01/18/2020   Os acromiale of right shoulder 01/18/2020   Carpal tunnel syndrome, left upper limb 01/18/2020   Aortic atherosclerosis ([place]) by Chest CT scan 01/07/2020 01/11/2020   Emphysema lung ([place]) 01/11/2020   Vitamin D deficiency 11/16/2015   Medication management 11/16/2015   Abnormal glucose 08/22/2014   Hyperlipidemia, mixed 10/05/2013   Rosacea    Anxiety    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

## 2021-02-27 ENCOUNTER — Other Ambulatory Visit: Payer: Self-pay

## 2021-02-27 ENCOUNTER — Encounter: Payer: Self-pay | Admitting: Rehabilitative and Restorative Service Providers"

## 2021-02-27 ENCOUNTER — Ambulatory Visit: Payer: Medicare Other | Admitting: Rehabilitative and Restorative Service Providers"

## 2021-02-27 DIAGNOSIS — M6281 Muscle weakness (generalized): Secondary | ICD-10-CM | POA: Diagnosis not present

## 2021-02-27 DIAGNOSIS — M25511 Pain in right shoulder: Secondary | ICD-10-CM | POA: Diagnosis not present

## 2021-02-27 DIAGNOSIS — R600 Localized edema: Secondary | ICD-10-CM

## 2021-02-27 DIAGNOSIS — G8929 Other chronic pain: Secondary | ICD-10-CM | POA: Diagnosis not present

## 2021-02-27 NOTE — Therapy (Signed)
[facility] Physical Therapy [address] [place], [place], [address] Phone: [phone]   Fax:  [phone]  Physical Therapy Treatment  Patient Details  Name: Kathryn Lamb MRN: [id number] Date of Birth: Mar 18, 1951 Referring Provider (PT): Dr. Durward Fortes   Encounter Date: 02/27/2021   PT End of Session - 02/27/21 1404     Visit Number 8    Number of Visits 18    Date for PT Re-Evaluation 03/13/21    Authorization Type UHC Medicare    Progress Note Due on Visit 10    PT Start Time 1344    PT Stop Time 1424    PT Time Calculation (min) 40 min    Activity Tolerance Patient tolerated treatment well    Behavior During Therapy [facility] for tasks assessed/performed             Past Medical History:  Diagnosis Date   Anxiety    Depression    Diverticulitis    Ischemic colitis ([place])    Mixed hyperlipidemia 10/05/2013   Rosacea     Past Surgical History:  Procedure Laterality Date   ABDOMINAL HYSTERECTOMY     APPENDECTOMY     CARPAL TUNNEL RELEASE Right 2004   LUMBAR LAMINECTOMY  2000   MICRODISCECTOMY LUMBAR     SHOULDER ARTHROSCOPY WITH ROTATOR CUFF REPAIR Right 12/21/2020   Dr. Durward Fortes   TUBAL LIGATION      There were no vitals filed for this visit.   Subjective Assessment - 02/27/21 1402     Subjective Pt. stated feeling fairly well today in shoulder.  No pain, just sore.    Limitations House hold activities;Sitting;Lifting    Patient Stated Goals Reduce pain    Currently in Pain? No/denies    Pain Score 3     Pain Location Shoulder    Pain Orientation Right    Pain Descriptors / Indicators Sore    Pain Type Chronic pain;Surgical pain    Pain Onset More than a month ago    Pain Frequency Intermittent    Aggravating Factors  general soreness    Pain Relieving Factors rest, ice                               [facility] Adult PT Treatment/Exercise - 02/27/21 0001       Shoulder Exercises: Supine   Horizontal ABduction  Both   3 x 10   Theraband Level (Shoulder Horizontal ABduction) Level 3 (Green)      Shoulder Exercises: Sidelying   External Rotation Right   3 x 10   External Rotation Weight (lbs) 2    ABduction Right   3 x 10   ABduction Weight (lbs) 1      Shoulder Exercises: Standing   Flexion 20 reps;Both;AAROM   1 lb bar   Other Standing Exercises UE ranger flexion, scaption x 20 each direction      Shoulder Exercises: ROM/Strengthening   UBE (Upper Arm Bike) Lvl 3.5 4 mins fwd/back each way UE only      Manual Therapy   Manual therapy comments PROM in all directions, g3 ap mobs in ER/IR Rt shoulder in 90 deg abduction                      PT Short Term Goals - 01/17/21 1412       PT SHORT TERM GOAL #1   Title Patient will demonstrate independent  use of home exercise program to maintain progress from in clinic treatments.    Time 3    Period Weeks    Status Achieved    Target Date 01/23/21               PT Long Term Goals - 02/20/21 1426       PT LONG TERM GOAL #1   Title Patient will demonstrate/report pain at worst less than or equal to 2/10 to facilitate minimal limitation in daily activity secondary to pain symptoms.    Time 10    Period Weeks    Status On-going    Target Date 03/13/21      PT LONG TERM GOAL #2   Title Patient will demonstrate independent use of home exercise program to facilitate ability to maintain/progress functional gains from skilled physical therapy services.    Time 10    Period Weeks    Status On-going    Target Date 03/13/21      PT LONG TERM GOAL #3   Title Patient will demonstrate Rt Hitchcock joint mobility WFL to facilitate usual self care, dressing, reaching overhead at PLOF s limitation due to symptoms.    Time 10    Period Weeks    Status On-going    Target Date 03/13/21      PT LONG TERM GOAL #4   Title Patient will demonstrate Rt UE MMT 5/5 throughout to facilitate usual lifting, carrying in functional activity to PLOF  s limitation.    Time 10    Period Weeks    Status On-going    Target Date 03/13/21      PT LONG TERM GOAL #5   Title Pt. will demonstrate FOTO outcome > or = 61 to indicated reduced disability due to condition.    Time 10    Period Weeks    Status On-going    Target Date 03/13/21                   Plan - 02/27/21 1406     Clinical Impression Statement Pt. demonstrated very mild IR resistance in 90 deg abduction in supine passive movement asssessment but overall passive mobility continued to show as [facility].  Slowly making gains in active strengthening progression at this time.  Continued strengthening indicated.  Plan to reassess measurements next visit for MD visit.    Examination-Activity Limitations Sleep;Bathing;Caring for Others;Carry;Toileting;Dressing;Hygiene/Grooming;Lift;Reach Overhead    Examination-Participation Restrictions Driving;Community Activity;Cleaning;Laundry;Yard Work;Meal Prep    Stability/Clinical Decision Making Stable/Uncomplicated    Rehab Potential Good    PT Frequency Other (comment)   1x/week for 4 weeks then 2x/week for POC of 10 weeks   PT Duration Other (comment)   10 weeks   PT Treatment/Interventions ADLs/Self Care Home Management;Cryotherapy;Iontophoresis 4mg /ml Dexamethasone;Moist Heat;Traction;Balance training;Therapeutic exercise;Therapeutic activities;Functional mobility training;Electrical Stimulation;Stair training;Gait training;DME Instruction;Ultrasound;Neuromuscular re-education;Patient/family education;Passive range of motion;Spinal Manipulations;Joint Manipulations;Dry needling;Taping;Vasopneumatic Device;Manual techniques    PT Next Visit Plan MD note/reassessment/FOTO    PT Home Exercise Plan [id number]    Consulted and Agree with Plan of Care Patient             Patient will benefit from skilled therapeutic intervention in order to improve the following deficits and impairments:  Decreased endurance, Pain, Impaired UE functional  use, Decreased strength, Decreased activity tolerance, Decreased range of motion, Impaired flexibility, Impaired perceived functional ability, Decreased mobility, Increased edema  Visit Diagnosis: Chronic right shoulder pain  Muscle weakness (generalized)  Localized edema  Problem List Patient Active Problem List   Diagnosis Date Noted   Former smoker (30 pack/year, quit 2011) 02/07/2021   Complete tear of right rotator cuff 01/18/2021   Labile hypertension 05/28/2020   Osteoarthritis of right AC (acromioclavicular) joint 01/18/2020   Os acromiale of right shoulder 01/18/2020   Carpal tunnel syndrome, left upper limb 01/18/2020   Aortic atherosclerosis ([place]) by Chest CT scan 01/07/2020 01/11/2020   Emphysema lung ([place]) 01/11/2020   Vitamin D deficiency 11/16/2015   Medication management 11/16/2015   Abnormal glucose 08/22/2014   Hyperlipidemia, mixed 10/05/2013   Rosacea    Anxiety    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

## 2021-03-01 ENCOUNTER — Other Ambulatory Visit: Payer: Self-pay

## 2021-03-01 ENCOUNTER — Encounter: Payer: Self-pay | Admitting: Rehabilitative and Restorative Service Providers"

## 2021-03-01 ENCOUNTER — Ambulatory Visit: Payer: Medicare Other | Admitting: Rehabilitative and Restorative Service Providers"

## 2021-03-01 DIAGNOSIS — M6281 Muscle weakness (generalized): Secondary | ICD-10-CM | POA: Diagnosis not present

## 2021-03-01 DIAGNOSIS — R600 Localized edema: Secondary | ICD-10-CM

## 2021-03-01 DIAGNOSIS — G8929 Other chronic pain: Secondary | ICD-10-CM

## 2021-03-01 DIAGNOSIS — M25511 Pain in right shoulder: Secondary | ICD-10-CM | POA: Diagnosis not present

## 2021-03-01 NOTE — Therapy (Signed)
[facility] Physical Therapy [address] [place], [place], [address] Phone: [phone]   Fax:  [phone]  Physical Therapy Treatment  Patient Details  Name: Kathryn Lamb MRN: [id number] Date of Birth: 04-14-1951 Referring Provider (PT): Dr. Durward Fortes   Encounter Date: 03/01/2021   PT End of Session - 03/01/21 1348     Visit Number 9    Number of Visits 18    Date for PT Re-Evaluation 03/13/21    Authorization Type UHC Medicare    Progress Note Due on Visit 10    PT Start Time 1340    PT Stop Time 1430    PT Time Calculation (min) 50 min    Activity Tolerance Patient tolerated treatment well    Behavior During Therapy [facility] for tasks assessed/performed             Past Medical History:  Diagnosis Date   Anxiety    Depression    Diverticulitis    Ischemic colitis ([place])    Mixed hyperlipidemia 10/05/2013   Rosacea     Past Surgical History:  Procedure Laterality Date   ABDOMINAL HYSTERECTOMY     APPENDECTOMY     CARPAL TUNNEL RELEASE Right 2004   LUMBAR LAMINECTOMY  2000   MICRODISCECTOMY LUMBAR     SHOULDER ARTHROSCOPY WITH ROTATOR CUFF REPAIR Right 12/21/2020   Dr. Durward Fortes   TUBAL LIGATION      There were no vitals filed for this visit.   Subjective Assessment - 03/01/21 1347     Subjective Pt. stated some soreness in Rt arm after last visit and today noted around 4/10 at times (c lifting more).  Pt. also indicated having spasms in back one day between last visit and today and noticing some Lt arm symptoms at times.  Reduced now.    Limitations House hold activities;Sitting;Lifting    Patient Stated Goals Reduce pain    Currently in Pain? Yes    Pain Score 4     Pain Location Shoulder    Pain Orientation Right    Pain Descriptors / Indicators Sore    Pain Type Chronic pain;Surgical pain    Pain Onset More than a month ago    Pain Frequency Intermittent    Aggravating Factors  lifting arm    Pain Relieving Factors rest                                [facility] Adult PT Treatment/Exercise - 03/01/21 0001       Blood Flow Restriction   Blood Flow Restriction Yes      Blood Flow Restriction-Positions    Blood Flow Restriction Position Supine      BFR-Supine   Supine Limb Occulsion Pressure (mmHg) 158    Supine Exercise Pressure (mmHg) 70   45%   Supine Exercise Prescription Comment exercises noted below      Shoulder Exercises: Supine   Flexion Right;Strengthening   BFR 0 lbs x30, 3 x 15 1 lb     Shoulder Exercises: Sidelying   External Rotation Right   BFR 30x, 3 x 15 1 lb   External Rotation Weight (lbs) 1    ABduction Right   BFR 30x, 3 x 15 1 lb   ABduction Weight (lbs) 1      Shoulder Exercises: Standing   Extension Both;20 reps    Theraband Level (Shoulder Extension) Level 3 (Green)    Row [employer]  Theraband Level (Shoulder Row) Level 3 (Green)      Shoulder Exercises: ROM/Strengthening   UBE (Upper Arm Bike) Lvl 3.5 4 mins fwd/back each way UE only      Vasopneumatic   Number Minutes Vasopneumatic  10 minutes    Vasopnuematic Location  Shoulder   Rt   Vasopneumatic Pressure Medium    Vasopneumatic Temperature  34                      PT Short Term Goals - 01/17/21 1412       PT SHORT TERM GOAL #1   Title Patient will demonstrate independent use of home exercise program to maintain progress from in clinic treatments.    Time 3    Period Weeks    Status Achieved    Target Date 01/23/21               PT Long Term Goals - 02/20/21 1426       PT LONG TERM GOAL #1   Title Patient will demonstrate/report pain at worst less than or equal to 2/10 to facilitate minimal limitation in daily activity secondary to pain symptoms.    Time 10    Period Weeks    Status On-going    Target Date 03/13/21      PT LONG TERM GOAL #2   Title Patient will demonstrate independent use of home exercise program to facilitate ability to maintain/progress  functional gains from skilled physical therapy services.    Time 10    Period Weeks    Status On-going    Target Date 03/13/21      PT LONG TERM GOAL #3   Title Patient will demonstrate Rt [place]abella joint mobility WFL to facilitate usual self care, dressing, reaching overhead at PLOF s limitation due to symptoms.    Time 10    Period Weeks    Status On-going    Target Date 03/13/21      PT LONG TERM GOAL #4   Title Patient will demonstrate Rt UE MMT 5/5 throughout to facilitate usual lifting, carrying in functional activity to PLOF s limitation.    Time 10    Period Weeks    Status On-going    Target Date 03/13/21      PT LONG TERM GOAL #5   Title Pt. will demonstrate FOTO outcome > or = 61 to indicated reduced disability due to condition.    Time 10    Period Weeks    Status On-going    Target Date 03/13/21                   Plan - 03/01/21 1359     Clinical Impression Statement Implementation of BFR for Rt arm strengthening today c fair to good response.  At this point, Pt. is most needling improved elevation strengthening for functional use.    Examination-Activity Limitations Sleep;Bathing;Caring for Others;Carry;Toileting;Dressing;Hygiene/Grooming;Lift;Reach Overhead    Examination-Participation Restrictions Driving;Community Activity;Cleaning;Laundry;Yard Work;Meal Prep    Stability/Clinical Decision Making Stable/Uncomplicated    Rehab Potential Good    PT Frequency Other (comment)   1x/week for 4 weeks then 2x/week for POC of 10 weeks   PT Duration Other (comment)   10 weeks   PT Treatment/Interventions ADLs/Self Care Home Management;Cryotherapy;Iontophoresis 4mg /ml Dexamethasone;Moist Heat;Traction;Balance training;Therapeutic exercise;Therapeutic activities;Functional mobility training;Electrical Stimulation;Stair training;Gait training;DME Instruction;Ultrasound;Neuromuscular re-education;Patient/family education;Passive range of motion;Spinal Manipulations;Joint  Manipulations;Dry needling;Taping;Vasopneumatic Device;Manual techniques    PT Next Visit Plan MD note/reassessment/FOTO (was mistaken  from last visit, due visit 10).  BFR continued in gravity reduced activity, increase to 50% LOP.    PT Home Exercise Plan [id number]    Consulted and Agree with Plan of Care Patient             Patient will benefit from skilled therapeutic intervention in order to improve the following deficits and impairments:  Decreased endurance, Pain, Impaired UE functional use, Decreased strength, Decreased activity tolerance, Decreased range of motion, Impaired flexibility, Impaired perceived functional ability, Decreased mobility, Increased edema  Visit Diagnosis: Chronic right shoulder pain  Muscle weakness (generalized)  Localized edema     Problem List Patient Active Problem List   Diagnosis Date Noted   Former smoker (30 pack/year, quit 2011) 02/07/2021   Complete tear of right rotator cuff 01/18/2021   Labile hypertension 05/28/2020   Osteoarthritis of right AC (acromioclavicular) joint 01/18/2020   Os acromiale of right shoulder 01/18/2020   Carpal tunnel syndrome, left upper limb 01/18/2020   Aortic atherosclerosis ([place]) by Chest CT scan 01/07/2020 01/11/2020   Emphysema lung ([place]) 01/11/2020   Vitamin D deficiency 11/16/2015   Medication management 11/16/2015   Abnormal glucose 08/22/2014   Hyperlipidemia, mixed 10/05/2013   Rosacea    Anxiety    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

## 2021-03-06 ENCOUNTER — Ambulatory Visit: Payer: Medicare Other | Admitting: Rehabilitative and Restorative Service Providers"

## 2021-03-06 ENCOUNTER — Other Ambulatory Visit: Payer: Self-pay

## 2021-03-06 ENCOUNTER — Encounter: Payer: Self-pay | Admitting: Rehabilitative and Restorative Service Providers"

## 2021-03-06 DIAGNOSIS — R600 Localized edema: Secondary | ICD-10-CM

## 2021-03-06 DIAGNOSIS — M25511 Pain in right shoulder: Secondary | ICD-10-CM | POA: Diagnosis not present

## 2021-03-06 DIAGNOSIS — G8929 Other chronic pain: Secondary | ICD-10-CM | POA: Diagnosis not present

## 2021-03-06 DIAGNOSIS — M6281 Muscle weakness (generalized): Secondary | ICD-10-CM | POA: Diagnosis not present

## 2021-03-06 NOTE — Therapy (Signed)
[facility] [address] [place], [place], [address] Phone: [phone]   Fax:  [phone]  Physical Therapy Treatment/Progress Note  Patient Details  Name: Kathryn Lamb MRN: [id number] Date of Birth: 1951-08-03 Referring Provider (PT): Dr. Durward Fortes   Encounter Date: 03/06/2021  Progress Note Reporting Period 01/02/2021 to 03/06/2021  See note below for Objective Data and Assessment of Progress/Goals.       PT End of Session - 03/06/21 1119     Visit Number 10    Number of Visits 18    Date for PT Re-Evaluation 03/13/21    Authorization Type U[employer] Medicare    Progress Note Due on Visit 20    PT Start Time 1058    PT Stop Time 1138    PT Time Calculation (min) 40 min    Activity Tolerance Patient tolerated treatment well    Behavior During Therapy WFL for tasks assessed/performed             Past Medical History:  Diagnosis Date   Anxiety    Depression    Diverticulitis    Ischemic colitis ([place])    Mixed hyperlipidemia 10/05/2013   Rosacea     Past Surgical History:  Procedure Laterality Date   ABDOMINAL HYSTERECTOMY     APPENDECTOMY     CARPAL TUNNEL RELEASE Right 2004   LUMBAR LAMINECTOMY  2000   MICRODISCECTOMY LUMBAR     SHOULDER ARTHROSCOPY WITH ROTATOR CUFF REPAIR Right 12/21/2020   Dr. Durward Fortes   TUBAL LIGATION      There were no vitals filed for this visit.   Subjective Assessment - 03/06/21 1119     Subjective Pt. indicated feeling ok after last visit and didn't report specific pain upon arrival today.  Global rating of change quite a bit better +5 at this time.    Limitations House hold activities;Sitting;Lifting    Patient Stated Goals Reduce pain    Currently in Pain? No/denies    Pain Score 0-No pain   pain at worst in last week 3/10   Pain Location Shoulder    Pain Orientation Right    Pain Onset More than a month ago                [facility] PT Assessment - 03/06/21 0001       Assessment    Medical Diagnosis Rt shoulder pain s/p RTC repair    Referring Provider (PT) Dr. Durward Fortes    Onset Date/Surgical Date 12/21/20    Hand Dominance Right      Observation/Other Assessments   Focus on Therapeutic Outcomes (FOTO)  update 59%      AROM   Right Shoulder Flexion 152 Degrees   in supine   Right Shoulder ABduction 165 Degrees   in supine   Right Shoulder Internal Rotation 80 Degrees   in supine 60 deg abd   Right Shoulder External Rotation 75 Degrees   in supine 60 deg abd     Strength   Right Shoulder Flexion 3+/5   8.7 lbs in supine 90 deg flexion, 6.2 lbs in seated 90 deg flexion   Right Shoulder ABduction 3+/5   5.5 lbs in sitting   Right Shoulder External Rotation 4/5   12.1, 12.6 lbs   Left Shoulder Flexion 5/5   13.3, 12.5 lb   Left Shoulder ABduction 4/5   12.5, 14 lbs   Left Shoulder External Rotation 4+/5   17.2, 14.5 lbs  [facility] Adult PT Treatment/Exercise - 03/06/21 0001       BFR-Supine   Supine Exercise Pressure (mmHg) 80    Supine Exercise Prescription Comment exercises noted below, cuff size 2      Shoulder Exercises: Supine   Flexion Right   BFR 80 mmHg x30, 3 x 15 30 sec rests 1 lb     Shoulder Exercises: Sidelying   External Rotation Right   BFR 80 mmHg 30x, 3 x 15 c 30 sec rest 2 lb   ABduction Right   BFR 80 mmHg x 30, 3 x 15 c 30 sec breaks 2 lbs   ABduction Weight (lbs) 2      Shoulder Exercises: Standing   Other Standing Exercises standing wall push up 3 x 10    Other Standing Exercises standing flexion 90 deg to abduction and lower and then reverse x 10 bilateral                      PT Short Term Goals - 01/17/21 1412       PT SHORT TERM GOAL #1   Title Patient will demonstrate independent use of home exercise program to maintain progress from in clinic treatments.    Time 3    Period Weeks    Status Achieved    Target Date 01/23/21               PT Long Term Goals -  03/06/21 1137       PT LONG TERM GOAL #1   Title Patient will demonstrate/report pain at worst less than or equal to 2/10 to facilitate minimal limitation in daily activity secondary to pain symptoms.    Time 10    Period Weeks    Status On-going    Target Date 03/13/21      PT LONG TERM GOAL #2   Title Patient will demonstrate independent use of home exercise program to facilitate ability to maintain/progress functional gains from skilled physical therapy services.    Time 10    Period Weeks    Status On-going    Target Date 03/13/21      PT LONG TERM GOAL #3   Title Patient will demonstrate Rt [place] joint mobility WFL to facilitate usual self care, dressing, reaching overhead at PLOF s limitation due to symptoms.    Time 10    Period Weeks    Status Achieved    Target Date 03/13/21      PT LONG TERM GOAL #4   Title Patient will demonstrate Rt UE MMT 5/5 throughout to facilitate usual lifting, carrying in functional activity to PLOF s limitation.    Time 10    Period Weeks    Status On-going    Target Date 03/13/21      PT LONG TERM GOAL #5   Title Pt. will demonstrate FOTO outcome > or = 61 to indicated reduced disability due to condition.    Time 10    Period Weeks    Status On-going    Target Date 03/13/21                   Plan - 03/06/21 1122     Clinical Impression Statement Pt. has attended 10 visits overall during course of treatment, reporting quite a bit better +5 on Global Rating of change and indicated 3/10 pain at worst in last week.  See objective data for updated information showing current presentation.  Gains noted  in mobility and strength to this point but Pt. to benefit from continued skilled PT to help continue progression of strength for improved daily function and use.    Examination-Activity Limitations Sleep;Bathing;Caring for Others;Carry;Toileting;Dressing;Hygiene/Grooming;Lift;Reach Overhead    Examination-Participation Restrictions  Driving;Community Activity;Cleaning;Laundry;Yard Work;Meal Prep    Stability/Clinical Decision Making Stable/Uncomplicated    Rehab Potential Good    PT Frequency Other (comment)   1x/week for 4 weeks then 2x/week for POC of 10 weeks   PT Duration Other (comment)   10 weeks   PT Treatment/Interventions ADLs/Self Care Home Management;Cryotherapy;Iontophoresis '4mg'$ /ml Dexamethasone;Moist Heat;Traction;Balance training;Therapeutic exercise;Therapeutic activities;Functional mobility training;Electrical Stimulation;Stair training;Gait training;DME Instruction;Ultrasound;Neuromuscular re-education;Patient/family education;Passive range of motion;Spinal Manipulations;Joint Manipulations;Dry needling;Taping;Vasopneumatic Device;Manual techniques    PT Next Visit Plan Continue c BFR, progressive strengthening program.    PT Home Exercise Plan [email or web address]    Consulted and Agree with Plan of Care Patient             Patient will benefit from skilled therapeutic intervention in order to improve the following deficits and impairments:  Decreased endurance, Pain, Impaired UE functional use, Decreased strength, Decreased activity tolerance, Decreased range of motion, Impaired flexibility, Impaired perceived functional ability, Decreased mobility, Increased edema  Visit Diagnosis: Chronic right shoulder pain  Muscle weakness (generalized)  Localized edema     Problem List Patient Active Problem List   Diagnosis Date Noted   Former smoker (30 pack/year, quit 2011) 02/07/2021   Complete tear of right rotator cuff 01/18/2021   Labile hypertension 05/28/2020   Osteoarthritis of right AC (acromioclavicular) joint 01/18/2020   Os acromiale of right shoulder 01/18/2020   Carpal tunnel syndrome, left upper limb 01/18/2020   Aortic atherosclerosis ([place]) by Chest CT scan 01/07/2020 01/11/2020   Emphysema lung ([place]) 01/11/2020   Vitamin D deficiency 11/16/2015   Medication management 11/16/2015   Abnormal  glucose 08/22/2014   Hyperlipidemia, mixed 10/05/2013   Rosacea    Anxiety    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

## 2021-03-08 ENCOUNTER — Other Ambulatory Visit: Payer: Self-pay

## 2021-03-08 ENCOUNTER — Ambulatory Visit
Admission: RE | Admit: 2021-03-08 | Discharge: 2021-03-08 | Disposition: A | Discharge status: Home / Self Care | Payer: Medicare Other | Source: Ambulatory Visit | Attending: Adult Health | Admitting: Adult Health

## 2021-03-08 ENCOUNTER — Ambulatory Visit: Payer: Medicare Other | Admitting: Rehabilitative and Restorative Service Providers"

## 2021-03-08 ENCOUNTER — Encounter: Payer: Self-pay | Admitting: Rehabilitative and Restorative Service Providers"

## 2021-03-08 DIAGNOSIS — Z87891 Personal history of nicotine dependence: Secondary | ICD-10-CM

## 2021-03-08 DIAGNOSIS — M6281 Muscle weakness (generalized): Secondary | ICD-10-CM

## 2021-03-08 DIAGNOSIS — M25511 Pain in right shoulder: Secondary | ICD-10-CM

## 2021-03-08 DIAGNOSIS — I2510 Atherosclerotic heart disease of native coronary artery without angina pectoris: Secondary | ICD-10-CM | POA: Diagnosis not present

## 2021-03-08 DIAGNOSIS — R600 Localized edema: Secondary | ICD-10-CM | POA: Diagnosis not present

## 2021-03-08 DIAGNOSIS — G8929 Other chronic pain: Secondary | ICD-10-CM

## 2021-03-08 DIAGNOSIS — I358 Other nonrheumatic aortic valve disorders: Secondary | ICD-10-CM | POA: Diagnosis not present

## 2021-03-08 DIAGNOSIS — J439 Emphysema, unspecified: Secondary | ICD-10-CM | POA: Diagnosis not present

## 2021-03-08 NOTE — Therapy (Signed)
[facility] Therapy [address] [place], [place], [address] Phone: [phone]   Fax:  [phone]  Physical Therapy Treatment  Patient Details  Name: Kathryn Lamb MRN: [id number] Date of Birth: [birth year] Referring Provider (PT): Dr. Durward Fortes   Encounter Date: 03/08/2021   PT End of Session - 03/08/21 1058     Visit Number 11    Number of Visits 18    Date for PT Re-Evaluation 03/13/21    Authorization Type UHC Medicare    Progress Note Due on Visit 20    PT Start Time 1055    PT Stop Time 1135    PT Time Calculation (min) 40 min    Activity Tolerance Patient tolerated treatment well    Behavior During Therapy [facility] for tasks assessed/performed             Past Medical History:  Diagnosis Date   Anxiety    Depression    Diverticulitis    Ischemic colitis ([place])    Mixed hyperlipidemia 10/05/2013   Rosacea     Past Surgical History:  Procedure Laterality Date   ABDOMINAL HYSTERECTOMY     APPENDECTOMY     CARPAL TUNNEL RELEASE Right 2004   LUMBAR LAMINECTOMY  2000   MICRODISCECTOMY LUMBAR     SHOULDER ARTHROSCOPY WITH ROTATOR CUFF REPAIR Right 12/21/2020   Dr. Durward Fortes   TUBAL LIGATION      There were no vitals filed for this visit.   Subjective Assessment - 03/08/21 1058     Subjective Pt. reported feeling pain around 2/10 c arm lifting upon arrival today.  Pt. stated some ache at home after activity.    Limitations House hold activities;Sitting;Lifting    Patient Stated Goals Reduce pain    Currently in Pain? Yes    Pain Score 2     Pain Location Shoulder    Pain Orientation Right    Pain Descriptors / Indicators Sore    Pain Type Chronic pain;Surgical pain    Pain Onset More than a month ago    Pain Frequency Intermittent    Aggravating Factors  arm lifting    Pain Relieving Factors resting arm                [facility] PT Assessment - 03/08/21 0001       AROM   Overall AROM Comments Painful arc noted in  forward elevation attempt upon arrival today                           [facility] Adult PT Treatment/Exercise - 03/08/21 0001       Shoulder Exercises: Supine   Flexion Right   BFR 80 mmHg 30x, 3 x 15 c 2 lbs   Shoulder Flexion Weight (lbs) 2      Shoulder Exercises: Sidelying   External Rotation Right   BFR 80 mmHg 30x 2 lb, x 15 2 lb, 2 x 15 1 lb   ABduction Right   BFR 80 mmHg 30x, 3 x 15   ABduction Weight (lbs) 2      Shoulder Exercises: Standing   Flexion Both   to 90 degrees x 10     Shoulder Exercises: ROM/Strengthening   UBE (Upper Arm Bike) Lvl 3.0 3 mins fwd/back each way      Manual Therapy   Manual therapy comments compression to Rt infraspinatus c movement  PT Short Term Goals - 01/17/21 1412       PT SHORT TERM GOAL #1   Title Patient will demonstrate independent use of home exercise program to maintain progress from in clinic treatments.    Time 3    Period Weeks    Status Achieved    Target Date 01/23/21               PT Long Term Goals - 03/06/21 1137       PT LONG TERM GOAL #1   Title Patient will demonstrate/report pain at worst less than or equal to 2/10 to facilitate minimal limitation in daily activity secondary to pain symptoms.    Time 10    Period Weeks    Status On-going    Target Date 03/13/21      PT LONG TERM GOAL #2   Title Patient will demonstrate independent use of home exercise program to facilitate ability to maintain/progress functional gains from skilled physical therapy services.    Time 10    Period Weeks    Status On-going    Target Date 03/13/21      PT LONG TERM GOAL #3   Title Patient will demonstrate Rt [place] joint mobility WFL to facilitate usual self care, dressing, reaching overhead at PLOF s limitation due to symptoms.    Time 10    Period Weeks    Status Achieved    Target Date 03/13/21      PT LONG TERM GOAL #4   Title Patient will demonstrate Rt UE MMT 5/5  throughout to facilitate usual lifting, carrying in functional activity to PLOF s limitation.    Time 10    Period Weeks    Status On-going    Target Date 03/13/21      PT LONG TERM GOAL #5   Title Pt. will demonstrate FOTO outcome > or = 61 to indicated reduced disability due to condition.    Time 10    Period Weeks    Status On-going    Target Date 03/13/21                   Plan - 03/08/21 1113     Clinical Impression Statement Continued progression in BFR training today c increasing resistance noted c fatigue as primary side effect noted from interventions.  Painful arc still noted in elevation above shoulder height against gravity c mild shrug noted.  Pt. to continue to benefit from strengthening program for improved arm use.    Examination-Activity Limitations Sleep;Bathing;Caring for Others;Carry;Toileting;Dressing;Hygiene/Grooming;Lift;Reach Overhead    Examination-Participation Restrictions Driving;Community Activity;Cleaning;Laundry;Yard Work;Meal Prep    Stability/Clinical Decision Making Stable/Uncomplicated    Rehab Potential Good    PT Frequency Other (comment)   1x/week for 4 weeks then 2x/week for POC of 10 weeks   PT Duration Other (comment)   10 weeks   PT Treatment/Interventions ADLs/Self Care Home Management;Cryotherapy;Iontophoresis '4mg'$ /ml Dexamethasone;Moist Heat;Traction;Balance training;Therapeutic exercise;Therapeutic activities;Functional mobility training;Electrical Stimulation;Stair training;Gait training;DME Instruction;Ultrasound;Neuromuscular re-education;Patient/family education;Passive range of motion;Spinal Manipulations;Joint Manipulations;Dry needling;Taping;Vasopneumatic Device;Manual techniques    PT Next Visit Plan BFR strengthening    PT Home Exercise Plan [email or web address]    Consulted and Agree with Plan of Care Patient             Patient will benefit from skilled therapeutic intervention in order to improve the following deficits and  impairments:  Decreased endurance, Pain, Impaired UE functional use, Decreased strength, Decreased activity tolerance, Decreased range of motion, Impaired flexibility, Impaired perceived  functional ability, Decreased mobility, Increased edema  Visit Diagnosis: Chronic right shoulder pain  Muscle weakness (generalized)  Localized edema     Problem List Patient Active Problem List   Diagnosis Date Noted   Former smoker (30 pack/year, quit 2011) 02/07/2021   Complete tear of right rotator cuff 01/18/2021   Labile hypertension 05/28/2020   Osteoarthritis of right AC (acromioclavicular) joint 01/18/2020   Os acromiale of right shoulder 01/18/2020   Carpal tunnel syndrome, left upper limb 01/18/2020   Aortic atherosclerosis ([place]) by Chest CT scan 01/07/2020 01/11/2020   Emphysema lung ([place]) 01/11/2020   Vitamin D deficiency 11/16/2015   Medication management 11/16/2015   Abnormal glucose 08/22/2014   Hyperlipidemia, mixed 10/05/2013   Rosacea    Anxiety    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[place])    History of ischemic colitis     Scot Jun, PT, DPT, OCS, ATC 03/08/21  11:25 AM   [facility] [address] [place], [place], [address] Phone: [phone]   Fax:  [phone]  Name: Kathryn Lamb MRN: [id number] Date of Birth: March 08, 1951

## 2021-03-20 ENCOUNTER — Encounter: Payer: Medicare Other | Admitting: Rehabilitative and Restorative Service Providers"

## 2021-03-20 ENCOUNTER — Encounter: Payer: Medicare Other | Admitting: Orthopaedic Surgery

## 2021-03-22 ENCOUNTER — Ambulatory Visit: Payer: Medicare Other | Admitting: Rehabilitative and Restorative Service Providers"

## 2021-03-22 ENCOUNTER — Encounter: Payer: Self-pay | Admitting: Orthopaedic Surgery

## 2021-03-22 ENCOUNTER — Other Ambulatory Visit: Payer: Self-pay

## 2021-03-22 ENCOUNTER — Encounter: Payer: Self-pay | Admitting: Rehabilitative and Restorative Service Providers"

## 2021-03-22 ENCOUNTER — Ambulatory Visit (INDEPENDENT_AMBULATORY_CARE_PROVIDER_SITE_OTHER): Payer: Medicare Other | Admitting: Orthopaedic Surgery

## 2021-03-22 VITALS — Ht 63.0 in | Wt 150.0 lb

## 2021-03-22 DIAGNOSIS — R600 Localized edema: Secondary | ICD-10-CM

## 2021-03-22 DIAGNOSIS — M6281 Muscle weakness (generalized): Secondary | ICD-10-CM

## 2021-03-22 DIAGNOSIS — M25511 Pain in right shoulder: Secondary | ICD-10-CM | POA: Diagnosis not present

## 2021-03-22 DIAGNOSIS — G8929 Other chronic pain: Secondary | ICD-10-CM

## 2021-03-22 DIAGNOSIS — S46011D Strain of muscle(s) and tendon(s) of the rotator cuff of right shoulder, subsequent encounter: Secondary | ICD-10-CM

## 2021-03-22 NOTE — Therapy (Addendum)
[facility] Physical Therapy [address] [place], [place], [address] Phone: [phone]   Fax:  [phone]  Physical Therapy Treatment/Progress Note/Recertification  Patient Details  Name: Kathryn Lamb MRN: [id number] Date of Birth: October 04, 1950 Referring Provider (PT): Dr. Durward Fortes   Encounter Date: 03/22/2021  Progress Note Reporting Period 03/06/2021 to 03/22/2021  See note below for Objective Data and Assessment of Progress/Goals.       PT End of Session - 03/22/21 1407     Visit Number 12    Number of Visits 23    Date for PT Re-Evaluation 05/03/21    Authorization Type UHC Medicare    Progress Note Due on Visit 22    PT Start Time 1342    PT Stop Time 1420    PT Time Calculation (min) 38 min    Activity Tolerance Patient tolerated treatment well    Behavior During Therapy WFL for tasks assessed/performed             Past Medical History:  Diagnosis Date   Anxiety    Depression    Diverticulitis    Ischemic colitis ([place])    Mixed hyperlipidemia 10/05/2013   Rosacea     Past Surgical History:  Procedure Laterality Date   ABDOMINAL HYSTERECTOMY     APPENDECTOMY     CARPAL TUNNEL RELEASE Right 2004   LUMBAR LAMINECTOMY  2000   MICRODISCECTOMY LUMBAR     SHOULDER ARTHROSCOPY WITH ROTATOR CUFF REPAIR Right 12/21/2020   Dr. Durward Fortes   TUBAL LIGATION      There were no vitals filed for this visit.   Subjective Assessment - 03/22/21 1346     Subjective Pt. indicated general soreness.  Pt. stated having COVID recently and was unable to do exercise in that time due to feeling better.    Limitations House hold activities;Sitting;Lifting    Patient Stated Goals Reduce pain    Pain Score 2     Pain Location Shoulder    Pain Orientation Right    Pain Descriptors / Indicators Sore    Pain Onset More than a month ago    Aggravating Factors  lifting, general soreness    Pain Relieving Factors heating pad, OTC medicine                 [facility] PT Assessment - 03/22/21 0001       Assessment   Medical Diagnosis Rt shoulder pain s/p RTC repair    Referring Provider (PT) Dr. Durward Fortes    Onset Date/Surgical Date 12/21/20    Hand Dominance Right      Observation/Other Assessments   Focus on Therapeutic Outcomes (FOTO)  update 64%      AROM   Overall AROM Comments Elevation flexion against gravity to 120 degrees    Right Shoulder Flexion 160 Degrees   in supine   Right Shoulder ABduction 162 Degrees   in supine   Right Shoulder Internal Rotation 80 Degrees   in supine 60 deg abd   Right Shoulder External Rotation 75 Degrees   in supine 60 deg abd     Strength   Right Shoulder Flexion 4/5   10.8 lbs, 10 lbs in 90 deg flexion supine, 6.2, 6.4 lbs in sitting 90 deg flexion   Right Shoulder ABduction 3+/5    Right Shoulder External Rotation 4/5   12.4, 11.2 lbs  [facility] Adult PT Treatment/Exercise - 03/22/21 0001       Shoulder Exercises: Standing   External Rotation Right   eccentric control only Rt arm 3 x 10 green band c towel at side   Theraband Level (Shoulder External Rotation) Level 3 (Green)    Flexion Both   3 x 10 0-90 degrees 1 lb   Shoulder Flexion Weight (lbs) 1    Other Standing Exercises standing scaption 0-90 degrees 1 lb 3 x 10    Other Standing Exercises wall push up 3 x 10      Shoulder Exercises: ROM/Strengthening   UBE (Upper Arm Bike) Lvl 3 3 mins fwd/back each way                    PT Education - 03/22/21 1358     Education Details POC education    Person(s) Educated Patient    Methods Explanation    Comprehension Verbalized understanding              PT Short Term Goals - 01/17/21 1412       PT SHORT TERM GOAL #1   Title Patient will demonstrate independent use of home exercise program to maintain progress from in clinic treatments.    Time 3    Period Weeks    Status Achieved    Target Date 01/23/21                PT Long Term Goals - 03/22/21 1357       PT LONG TERM GOAL #1   Title Patient will demonstrate/report pain at worst less than or equal to 2/10 to facilitate minimal limitation in daily activity secondary to pain symptoms.    Time 6    Period Weeks    Status Revised    Target Date 05/03/21      PT LONG TERM GOAL #2   Title Patient will demonstrate independent use of home exercise program to facilitate ability to maintain/progress functional gains from skilled physical therapy services.    Time 6    Period Weeks    Status Revised    Target Date 05/03/21      PT LONG TERM GOAL #3   Title Patient will demonstrate Rt [place] joint mobility WFL to facilitate usual self care, dressing, reaching overhead at PLOF s limitation due to symptoms.    Time 10    Period Weeks    Status Achieved      PT LONG TERM GOAL #4   Title Patient will demonstrate Rt UE MMT 4/5 throughout to facilitate usual lifting, carrying in functional activity to PLOF s limitation.    Time 6    Period Weeks    Status Revised    Target Date 05/03/21      PT LONG TERM GOAL #5   Title Pt. will demonstrate FOTO outcome > or = 61 to indicated reduced disability due to condition.    Time 6    Period Weeks    Status Revised    Target Date 05/03/21                   Plan - 03/22/21 1355     Clinical Impression Statement Pt. has attended 12 visits overall during course of treatment.  See objective data for updated information.  Pt. chief impairment at this time is Rt shoulder strength in all directions.  Overall progression impacted by recent COVID diagnosis and illness that limited Pt. in  attendence to treatment and ability to perform HEP.  Pt. returned today for first time in several weeks.  Pt. is appropriate for continued skilled PT services to improve strength and function.    Examination-Activity Limitations Sleep;Bathing;Caring for Others;Carry;Toileting;Dressing;Hygiene/Grooming;Lift;Reach Overhead     Examination-Participation Restrictions Driving;Community Activity;Cleaning;Laundry;Yard Work;Meal Prep    Stability/Clinical Decision Making Stable/Uncomplicated    Rehab Potential Good    PT Frequency 2x / week   1x/week for 4 weeks then 2x/week for POC of 10 weeks   PT Duration 8 weeks   10 weeks   PT Treatment/Interventions ADLs/Self Care Home Management;Cryotherapy;Iontophoresis '4mg'$ /ml Dexamethasone;Moist Heat;Traction;Balance training;Therapeutic exercise;Therapeutic activities;Functional mobility training;Electrical Stimulation;Stair training;Gait training;DME Instruction;Ultrasound;Neuromuscular re-education;Patient/family education;Passive range of motion;Spinal Manipulations;Joint Manipulations;Dry needling;Taping;Vasopneumatic Device;Manual techniques    PT Next Visit Plan Resume elevation strengthening, BFR as tolerated.  Returning to MD today.    PT Home Exercise Plan [email or web address]    Consulted and Agree with Plan of Care Patient             Patient will benefit from skilled therapeutic intervention in order to improve the following deficits and impairments:  Decreased endurance, Pain, Impaired UE functional use, Decreased strength, Decreased activity tolerance, Decreased range of motion, Impaired flexibility, Impaired perceived functional ability, Decreased mobility, Increased edema  Visit Diagnosis: Chronic right shoulder pain - Plan: PT plan of care cert/re-cert  Muscle weakness (generalized) - Plan: PT plan of care cert/re-cert  Localized edema - Plan: PT plan of care cert/re-cert     Problem List Patient Active Problem List   Diagnosis Date Noted   Former smoker (30 pack/year, quit 2011) 02/07/2021   Complete tear of right rotator cuff 01/18/2021   Labile hypertension 05/28/2020   Osteoarthritis of right AC (acromioclavicular) joint 01/18/2020   Os acromiale of right shoulder 01/18/2020   Carpal tunnel syndrome, left upper limb 01/18/2020   Aortic atherosclerosis  ([place]) by Chest CT scan 01/07/2020 01/11/2020   Emphysema lung ([place]) 01/11/2020   Vitamin D deficiency 11/16/2015   Medication management 11/16/2015   Abnormal glucose 08/22/2014   Hyperlipidemia, mixed 10/05/2013   Rosacea    Anxiety    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 [place], [place], [address] Phone: [phone]   Fax:  [phone]  Name: Kathryn Lamb MRN: [id number] Date of Birth: June 18, 1951

## 2021-03-22 NOTE — Progress Notes (Signed)
Office Visit Note   Patient: Kathryn Lamb           Date of Birth: 08-16-1950           MRN: [id number] Visit Date: 03/22/2021              Requested by: Unk Pinto, [address] [place],  [place] [address] PCP: Unk Pinto, MD   Assessment & Plan: Visit Diagnoses:  1. Traumatic complete tear of right rotator cuff, subsequent encounter     Plan: 73-monthstatus post arthroscopic SCD DCR right shoulder with a mini open rotator cuff tear repair and biceps tenodesis.  Has done exceptionally well no longer has the pain she had preoperatively.  Able to raise her arm quickly overhead without any problem.  Negative impingement testing.  Good strength.  No popping or clicking.  Has done very well with the surgery and with her physical therapy.  She is going to check with her insurance company regarding continuing the therapy versus just home exercise program.  I think she is done very well plan to see her back as needed but it urged her to call if she had any questions or concerns.  Needs to continue working at home with strengthening exercises  Follow-Up Instructions: Return if symptoms worsen or fail to improve.   Orders:  No orders of the defined types were placed in this encounter.  No orders of the defined types were placed in this encounter.     Procedures: No procedures performed   Clinical Data: No additional findings.   Subjective: Chief Complaint  Patient presents with   Right Shoulder - Follow-up  Patient presents today for a one month follow up on her right shoulder. She is now 3 months out from a rotator cuff tear repair.She states that she is doing well overall. She is still going to physical therapy twice weekly. Her only complaint is still some lingering soreness.  HPI  Review of Systems   Objective: Vital Signs: Ht '5\' 3"'$  (1.6 m)   Wt 150 lb (68 kg)   BMI 26.57 kg/m   Physical Exam  Ortho Exam right shoulder incisions of  healed without any problem.  Full quick overhead motion.  Does not have full internal rotation but can touch the lower part of her back but not the middle of her back but notes that that has always been the case even before surgery.  Neurologically intact.  Negative impingement testing.  Specialty Comments:  No specialty comments available.  Imaging: No results found.   PMFS History: Patient Active Problem List   Diagnosis Date Noted   Former smoker (30 pack/year, quit 2011) 02/07/2021   Complete tear of right rotator cuff 01/18/2021   Labile hypertension 05/28/2020   Osteoarthritis of right AC (acromioclavicular) joint 01/18/2020   Os acromiale of right shoulder 01/18/2020   Carpal tunnel syndrome, left upper limb 01/18/2020   Aortic atherosclerosis ([place] by Chest CT scan 01/07/2020 01/11/2020   [place] 01/11/2020   Vitamin D deficiency 11/16/2015   Medication management 11/16/2015   Abnormal glucose 08/22/2014   Hyperlipidemia, mixed 10/05/2013   Rosacea    Anxiety    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[facility]    History of ischemic colitis    Past Medical History:  Diagnosis Date   Anxiety    Depression    Diverticulitis    Ischemic colitis ([place]    Mixed hyperlipidemia 10/05/2013   Rosacea  Family History  Problem Relation Age of Onset   Diabetes Father    Alcohol abuse Father    Cancer Father        prostate, lung   Hypertension Mother    Stroke Mother    Heart disease Mother    Diabetes Sister     Past Surgical History:  Procedure Laterality Date   ABDOMINAL HYSTERECTOMY     APPENDECTOMY     CARPAL TUNNEL RELEASE Right 2004   LUMBAR LAMINECTOMY  2000   MICRODISCECTOMY LUMBAR     SHOULDER ARTHROSCOPY WITH ROTATOR CUFF REPAIR Right 12/21/2020   Dr. Durward Fortes   TUBAL LIGATION     Social History   Occupational History   Not on file  Tobacco Use   Smoking status: Former    Packs/day: 1.00    Years: 30.00    Pack years: 30.00    Types:  Cigarettes    Quit date: 10/04/2008    Years since quitting: 12.4   Smokeless tobacco: Never  Vaping Use   Vaping Use: Never used  Substance and Sexual Activity   Alcohol use: No   Drug use: No   Sexual activity: Not on file

## 2021-03-27 ENCOUNTER — Ambulatory Visit: Payer: Medicare Other | Admitting: Rehabilitative and Restorative Service Providers"

## 2021-03-27 ENCOUNTER — Other Ambulatory Visit: Payer: Self-pay

## 2021-03-27 ENCOUNTER — Encounter: Payer: Self-pay | Admitting: Rehabilitative and Restorative Service Providers"

## 2021-03-27 DIAGNOSIS — R600 Localized edema: Secondary | ICD-10-CM | POA: Diagnosis not present

## 2021-03-27 DIAGNOSIS — G8929 Other chronic pain: Secondary | ICD-10-CM | POA: Diagnosis not present

## 2021-03-27 DIAGNOSIS — M6281 Muscle weakness (generalized): Secondary | ICD-10-CM

## 2021-03-27 DIAGNOSIS — M25511 Pain in right shoulder: Secondary | ICD-10-CM

## 2021-03-27 NOTE — Patient Instructions (Signed)
Access Code: [email or web address] URL: [email or web address] Date: 03/27/2021 Prepared by: Scot Jun  Exercises Seated Scapular Retraction - 2 x daily - 7 x weekly - 2 sets - 10 reps - 5 hold Supine Shoulder External Rotation in 45 Degrees Abduction AAROM with Dowel (Mirrored) - 2-3 x daily - 7 x weekly - 1-2 sets - 10 reps - 5 hold Standing Shoulder Abduction AAROM with Dowel (Mirrored) - 2 x daily - 7 x weekly - 3 sets - 10 reps - 2 hold Sidelying Shoulder Abduction Palm Forward - 2 x daily - 7 x weekly - 3 sets - 10-15 reps Sidelying Shoulder ER with Towel and Dumbbell - 2 x daily - 7 x weekly - 3 sets - 10-15 reps Standing Shoulder Flexion to 90 Degrees with Dumbbells - 1 x daily - 7 x weekly - 3 sets - 10 reps

## 2021-03-27 NOTE — Therapy (Signed)
[facility] [address] [place], [place], [address] Phone: [phone]   Fax:  [phone]  Physical Therapy Treatment  Patient Details  Name: Kathryn Lamb MRN: [id number] Date of Birth: [birth year] Referring Provider (PT): Dr. Durward Fortes   Encounter Date: 03/27/2021   PT End of Session - 03/27/21 1313     Visit Number 13    Number of Visits 23    Date for PT Re-Evaluation 05/03/21    Authorization Type UHC Medicare    Progress Note Due on Visit 22    PT Start Time 1300    PT Stop Time 1340    PT Time Calculation (min) 40 min    Activity Tolerance Patient tolerated treatment well    Behavior During Therapy [facility] for tasks assessed/performed             Past Medical History:  Diagnosis Date   Anxiety    Depression    Diverticulitis    Ischemic colitis ([place])    Mixed hyperlipidemia 10/05/2013   Rosacea     Past Surgical History:  Procedure Laterality Date   ABDOMINAL HYSTERECTOMY     APPENDECTOMY     CARPAL TUNNEL RELEASE Right 2004   LUMBAR LAMINECTOMY  2000   MICRODISCECTOMY LUMBAR     SHOULDER ARTHROSCOPY WITH ROTATOR CUFF REPAIR Right 12/21/2020   Dr. Durward Fortes   TUBAL LIGATION      There were no vitals filed for this visit.   Subjective Assessment - 03/27/21 1303     Subjective Pt. indicated she had an increase in bilateral shoulder, upper back soreness after last visit. Pt. stated she was disappointed in her physical ability overall and felt like she was out of shape.   One painful pain in shoulder noted c UBE use today but ok afteward.    Limitations House hold activities;Sitting;Lifting    Patient Stated Goals Reduce pain    Currently in Pain? No/denies    Pain Score 0-No pain    Pain Location Shoulder    Pain Orientation Right    Pain Descriptors / Indicators Sore    Pain Type Chronic pain;Surgical pain    Pain Onset More than a month ago    Pain Frequency Intermittent    Aggravating Factors  soreness after  last visit for both shoulders/upper back    Pain Relieving Factors rest                               [facility] Adult PT Treatment/Exercise - 03/27/21 0001       Shoulder Exercises: Standing   Flexion Both   0-90 degrees 3 x 10   ABduction Both   2 x 10 0-90 degrees   Other Standing Exercises standing ER walkouts green band 5 sec hold 2 x 10    Other Standing Exercises standing 90 deg flexion ball circles 20x cw, ccw each      Shoulder Exercises: ROM/Strengthening   UBE (Upper Arm Bike) Lvl 3.5 3 mins fwd/back each way    [place] flexion 1.5 lbs 3 x 10                    PT Education - 03/27/21 1329     Education Details HEP progression to including standing against gravity    Person(s) Educated Patient    Methods Explanation;Demonstration;Verbal cues    Comprehension Verbalized understanding;Returned demonstration  PT Short Term Goals - 01/17/21 1412       PT SHORT TERM GOAL #1   Title Patient will demonstrate independent use of home exercise program to maintain progress from in clinic treatments.    Time 3    Period Weeks    Status Achieved    Target Date 01/23/21               PT Long Term Goals - 03/22/21 1357       PT LONG TERM GOAL #1   Title Patient will demonstrate/report pain at worst less than or equal to 2/10 to facilitate minimal limitation in daily activity secondary to pain symptoms.    Time 6    Period Weeks    Status Revised    Target Date 05/03/21      PT LONG TERM GOAL #2   Title Patient will demonstrate independent use of home exercise program to facilitate ability to maintain/progress functional gains from skilled physical therapy services.    Time 6    Period Weeks    Status Revised    Target Date 05/03/21      PT LONG TERM GOAL #3   Title Patient will demonstrate Rt [place] joint mobility WFL to facilitate usual self care, dressing, reaching overhead at PLOF s limitation due to symptoms.    Time  10    Period Weeks    Status Achieved      PT LONG TERM GOAL #4   Title Patient will demonstrate Rt UE MMT 4/5 throughout to facilitate usual lifting, carrying in functional activity to PLOF s limitation.    Time 6    Period Weeks    Status Revised    Target Date 05/03/21      PT LONG TERM GOAL #5   Title Pt. will demonstrate FOTO outcome > or = 61 to indicated reduced disability due to condition.    Time 6    Period Weeks    Status Revised    Target Date 05/03/21                   Plan - 03/27/21 1326     Clinical Impression Statement Transitioned today to primarily against gravity movement without BFR due to difficulty increase.  Pt. was able to perform acceptability following consistent cues during intervention for techniques and procedures to encourage good performance.  Elevation attempts continue to slowly improve.    Examination-Activity Limitations Sleep;Bathing;Caring for Others;Carry;Toileting;Dressing;Hygiene/Grooming;Lift;Reach Overhead    Examination-Participation Restrictions Driving;Community Activity;Cleaning;Laundry;Yard Work;Meal Prep    Stability/Clinical Decision Making Stable/Uncomplicated    Rehab Potential Good    PT Frequency 2x / week   1x/week for 4 weeks then 2x/week for POC of 10 weeks   PT Duration 8 weeks    PT Treatment/Interventions ADLs/Self Care Home Management;Cryotherapy;Iontophoresis '4mg'$ /ml Dexamethasone;Moist Heat;Traction;Balance training;Therapeutic exercise;Therapeutic activities;Functional mobility training;Electrical Stimulation;Stair training;Gait training;DME Instruction;Ultrasound;Neuromuscular re-education;Patient/family education;Passive range of motion;Spinal Manipulations;Joint Manipulations;Dry needling;Taping;Vasopneumatic Device;Manual techniques    PT Next Visit Plan Resume elevation strengthening, BFR as tolerated mixed in.    PT Home Exercise Plan [email or web address]    Consulted and Agree with Plan of Care Patient              Patient will benefit from skilled therapeutic intervention in order to improve the following deficits and impairments:  Decreased endurance, Pain, Impaired UE functional use, Decreased strength, Decreased activity tolerance, Decreased range of motion, Impaired flexibility, Impaired perceived functional ability, Decreased mobility, Increased edema  Visit Diagnosis: Chronic  right shoulder pain  Muscle weakness (generalized)  Localized edema     Problem List Patient Active Problem List   Diagnosis Date Noted   Former smoker (30 pack/year, quit 2011) 02/07/2021   Complete tear of right rotator cuff 01/18/2021   Labile hypertension 05/28/2020   Osteoarthritis of right AC (acromioclavicular) joint 01/18/2020   Os acromiale of right shoulder 01/18/2020   Carpal tunnel syndrome, left upper limb 01/18/2020   Aortic atherosclerosis ([place]) by Chest CT scan 01/07/2020 01/11/2020   Emphysema lung ([place]) 01/11/2020   Vitamin D deficiency 11/16/2015   Medication management 11/16/2015   Abnormal glucose 08/22/2014   Hyperlipidemia, mixed 10/05/2013   Rosacea    Anxiety    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 [place], [place], [address] Phone: [phone]   Fax:  [phone]  Name: Kathryn Lamb MRN: [id number] Date of Birth: 1950-09-22

## 2021-03-29 ENCOUNTER — Ambulatory Visit: Payer: Medicare Other | Admitting: Rehabilitative and Restorative Service Providers"

## 2021-03-29 ENCOUNTER — Encounter: Payer: Self-pay | Admitting: Rehabilitative and Restorative Service Providers"

## 2021-03-29 ENCOUNTER — Other Ambulatory Visit: Payer: Self-pay

## 2021-03-29 DIAGNOSIS — G8929 Other chronic pain: Secondary | ICD-10-CM | POA: Diagnosis not present

## 2021-03-29 DIAGNOSIS — R600 Localized edema: Secondary | ICD-10-CM

## 2021-03-29 DIAGNOSIS — M6281 Muscle weakness (generalized): Secondary | ICD-10-CM

## 2021-03-29 DIAGNOSIS — M25511 Pain in right shoulder: Secondary | ICD-10-CM

## 2021-03-29 NOTE — Therapy (Signed)
[facility] [address] [place], [place], [address] Phone: [phone]   Fax:  [phone]  Physical Therapy Treatment  Patient Details  Name: Kathryn Lamb MRN: [id number] Date of Birth: September 17, 1950 Referring Provider (PT): Dr. Durward Fortes   Encounter Date: 03/29/2021   PT End of Session - 03/29/21 1345     Visit Number 14    Number of Visits 23    Date for PT Re-Evaluation 05/03/21    Authorization Type UHC Medicare    Progress Note Due on Visit 22    PT Start Time 1345    PT Stop Time 1424    PT Time Calculation (min) 39 min    Activity Tolerance Patient tolerated treatment well    Behavior During Therapy [facility] for tasks assessed/performed             Past Medical History:  Diagnosis Date   Anxiety    Depression    Diverticulitis    Ischemic colitis ([place])    Mixed hyperlipidemia 10/05/2013   Rosacea     Past Surgical History:  Procedure Laterality Date   ABDOMINAL HYSTERECTOMY     APPENDECTOMY     CARPAL TUNNEL RELEASE Right 2004   LUMBAR LAMINECTOMY  2000   MICRODISCECTOMY LUMBAR     SHOULDER ARTHROSCOPY WITH ROTATOR CUFF REPAIR Right 12/21/2020   Dr. Durward Fortes   TUBAL LIGATION      There were no vitals filed for this visit.   Subjective Assessment - 03/29/21 1351     Subjective Pt. stated a little sore but overall doing well today.  No specific pain increases noted.    Limitations House hold activities;Sitting;Lifting    Patient Stated Goals Reduce pain    Currently in Pain? No/denies   sore not pain   Pain Orientation Right    Pain Descriptors / Indicators Sore    Pain Type Chronic pain;Surgical pain    Pain Onset More than a month ago    Pain Frequency Intermittent    Aggravating Factors  sore after exercise at times    Pain Relieving Factors rest                               [facility] Adult PT Treatment/Exercise - 03/29/21 0001       Blood Flow Restriction-Positions    Blood Flow  Restriction Position Standing      BFR-Supine   Supine Limb Occulsion Pressure (mmHg) 155    Supine Exercise Pressure (mmHg) 80    Supine Exercise Prescription Comment exercises noted below, cuff size 2      Shoulder Exercises: Standing   External Rotation Right   BFR 80 mmHg 30x, 3 x 15 c towel at side   Flexion Right   BFR 80 mmHg 30x, 3 x 15 c 30 sec rest breaks between   ABduction Right   1 lb 2 x 10   Shoulder ABduction Weight (lbs) 1    Row Both   3 x 15   Theraband Level (Shoulder Row) Level 3 (Green)    Other Standing Exercises standing 90 deg flexion ball circles 30 x 2 cw, ccw each      Shoulder Exercises: ROM/Strengthening   UBE (Upper Arm Bike) Lvl 3 3 mins fwd/back each way                      PT Short Term Goals - 01/17/21 1412  PT SHORT TERM GOAL #1   Title Patient will demonstrate independent use of home exercise program to maintain progress from in clinic treatments.    Time 3    Period Weeks    Status Achieved    Target Date 01/23/21               PT Long Term Goals - 03/22/21 1357       PT LONG TERM GOAL #1   Title Patient will demonstrate/report pain at worst less than or equal to 2/10 to facilitate minimal limitation in daily activity secondary to pain symptoms.    Time 6    Period Weeks    Status Revised    Target Date 05/03/21      PT LONG TERM GOAL #2   Title Patient will demonstrate independent use of home exercise program to facilitate ability to maintain/progress functional gains from skilled physical therapy services.    Time 6    Period Weeks    Status Revised    Target Date 05/03/21      PT LONG TERM GOAL #3   Title Patient will demonstrate Rt [place] joint mobility WFL to facilitate usual self care, dressing, reaching overhead at PLOF s limitation due to symptoms.    Time 10    Period Weeks    Status Achieved      PT LONG TERM GOAL #4   Title Patient will demonstrate Rt UE MMT 4/5 throughout to facilitate usual  lifting, carrying in functional activity to PLOF s limitation.    Time 6    Period Weeks    Status Revised    Target Date 05/03/21      PT LONG TERM GOAL #5   Title Pt. will demonstrate FOTO outcome > or = 61 to indicated reduced disability due to condition.    Time 6    Period Weeks    Status Revised    Target Date 05/03/21                    Patient will benefit from skilled therapeutic intervention in order to improve the following deficits and impairments:     Visit Diagnosis: Chronic right shoulder pain  Muscle weakness (generalized)  Localized edema     Problem List Patient Active Problem List   Diagnosis Date Noted   Former smoker (30 pack/year, quit 2011) 02/07/2021   Complete tear of right rotator cuff 01/18/2021   Labile hypertension 05/28/2020   Osteoarthritis of right AC (acromioclavicular) joint 01/18/2020   Os acromiale of right shoulder 01/18/2020   Carpal tunnel syndrome, left upper limb 01/18/2020   Aortic atherosclerosis ([place]) by Chest CT scan 01/07/2020 01/11/2020   Emphysema lung ([place]) 01/11/2020   Vitamin D deficiency 11/16/2015   Medication management 11/16/2015   Abnormal glucose 08/22/2014   Hyperlipidemia, mixed 10/05/2013   Rosacea    Anxiety    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 [place], [place], [address] Phone: [phone]   Fax:  [phone]  Name: Kathryn Lamb MRN: [id number] Date of Birth: [birth year]

## 2021-04-03 ENCOUNTER — Other Ambulatory Visit: Payer: Self-pay

## 2021-04-03 ENCOUNTER — Ambulatory Visit: Payer: Medicare Other | Admitting: Rehabilitative and Restorative Service Providers"

## 2021-04-03 ENCOUNTER — Encounter: Payer: Self-pay | Admitting: Rehabilitative and Restorative Service Providers"

## 2021-04-03 DIAGNOSIS — G8929 Other chronic pain: Secondary | ICD-10-CM | POA: Diagnosis not present

## 2021-04-03 DIAGNOSIS — R600 Localized edema: Secondary | ICD-10-CM | POA: Diagnosis not present

## 2021-04-03 DIAGNOSIS — M6281 Muscle weakness (generalized): Secondary | ICD-10-CM | POA: Diagnosis not present

## 2021-04-03 DIAGNOSIS — M25511 Pain in right shoulder: Secondary | ICD-10-CM | POA: Diagnosis not present

## 2021-04-03 NOTE — Therapy (Signed)
[facility] [address] [place], [place], [address] Phone: [phone]   Fax:  [phone]  Physical Therapy Treatment  Patient Details  Name: Kathryn Lamb MRN: [id number] Date of Birth: 1950/08/28 Referring Provider (PT): Dr. Durward Fortes   Encounter Date: 04/03/2021   PT End of Session - 04/03/21 1300     Visit Number 15    Number of Visits 23    Date for PT Re-Evaluation 05/03/21    Authorization Type UHC Medicare    Progress Note Due on Visit 22    PT Start Time 1300    PT Stop Time 1339    PT Time Calculation (min) 39 min    Activity Tolerance Patient tolerated treatment well    Behavior During Therapy [facility] for tasks assessed/performed             Past Medical History:  Diagnosis Date   Anxiety    Depression    Diverticulitis    Ischemic colitis ([place])    Mixed hyperlipidemia 10/05/2013   Rosacea     Past Surgical History:  Procedure Laterality Date   ABDOMINAL HYSTERECTOMY     APPENDECTOMY     CARPAL TUNNEL RELEASE Right 2004   LUMBAR LAMINECTOMY  2000   MICRODISCECTOMY LUMBAR     SHOULDER ARTHROSCOPY WITH ROTATOR CUFF REPAIR Right 12/21/2020   Dr. Durward Fortes   TUBAL LIGATION      There were no vitals filed for this visit.   Subjective Assessment - 04/03/21 1303     Subjective Pt. reported feeling "a little sore" from housework but doing ok.  Pt. stated the home execises "have been going well."    Limitations House hold activities;Sitting;Lifting    Patient Stated Goals Reduce pain    Currently in Pain? No/denies   no pain ,just a little sore   Pain Score 0-No pain    Pain Location Shoulder    Pain Orientation Right    Pain Descriptors / Indicators Sore    Pain Type Chronic pain;Surgical pain    Pain Onset More than a month ago    Pain Frequency Intermittent    Aggravating Factors  housework    Pain Relieving Factors rest                [facility] PT Assessment - 04/03/21 0001       Assessment    Medical Diagnosis Rt shoulder pain s/p RTC repair    Referring Provider (PT) Dr. Durward Fortes    Onset Date/Surgical Date 12/21/20    Hand Dominance Right      AROM   Overall AROM Comments Full range in flexion against gravity s shrug      Strength   Right Shoulder Flexion 4/5                           [facility] Adult PT Treatment/Exercise - 04/03/21 0001       Shoulder Exercises: Standing   External Rotation Right   eccentric c towel at side 3 x 10   Theraband Level (Shoulder External Rotation) Level 3 (Green)    Internal Rotation Right   3 x 15 c towel at side   Flexion Right   85 mmHg 30x, 3 x 15. full range   Shoulder Flexion Weight (lbs) 1    ABduction Right   BFR 85 mmHg 30x, 3 x 15 0-90 degrees   Other Standing Exercises standing 90 deg flexion ball circles 30  x 2 cw, ccw each      Shoulder Exercises: ROM/Strengthening   UBE (Upper Arm Bike) Lvl 3.5 3 mins fwd/back each way                      PT Short Term Goals - 01/17/21 1412       PT SHORT TERM GOAL #1   Title Patient will demonstrate independent use of home exercise program to maintain progress from in clinic treatments.    Time 3    Period Weeks    Status Achieved    Target Date 01/23/21               PT Long Term Goals - 03/22/21 1357       PT LONG TERM GOAL #1   Title Patient will demonstrate/report pain at worst less than or equal to 2/10 to facilitate minimal limitation in daily activity secondary to pain symptoms.    Time 6    Period Weeks    Status Revised    Target Date 05/03/21      PT LONG TERM GOAL #2   Title Patient will demonstrate independent use of home exercise program to facilitate ability to maintain/progress functional gains from skilled physical therapy services.    Time 6    Period Weeks    Status Revised    Target Date 05/03/21      PT LONG TERM GOAL #3   Title Patient will demonstrate Rt [place] joint mobility WFL to facilitate usual self care,  dressing, reaching overhead at PLOF s limitation due to symptoms.    Time 10    Period Weeks    Status Achieved      PT LONG TERM GOAL #4   Title Patient will demonstrate Rt UE MMT 4/5 throughout to facilitate usual lifting, carrying in functional activity to PLOF s limitation.    Time 6    Period Weeks    Status Revised    Target Date 05/03/21      PT LONG TERM GOAL #5   Title Pt. will demonstrate FOTO outcome > or = 61 to indicated reduced disability due to condition.    Time 6    Period Weeks    Status Revised    Target Date 05/03/21                   Plan - 04/03/21 1327     Clinical Impression Statement Continued strengthening program c mixture of BFR and banded resistances.  Quality of elevation improving in all directions at this time.  Continued skilled PT to improve strength for possible discharge in a few visits.    Examination-Activity Limitations Sleep;Bathing;Caring for Others;Carry;Toileting;Dressing;Hygiene/Grooming;Lift;Reach Overhead    Examination-Participation Restrictions Driving;Community Activity;Cleaning;Laundry;Yard Work;Meal Prep    Stability/Clinical Decision Making Stable/Uncomplicated    Rehab Potential Good    PT Frequency 2x / week   1x/week for 4 weeks then 2x/week for POC of 10 weeks   PT Duration 8 weeks    PT Treatment/Interventions ADLs/Self Care Home Management;Cryotherapy;Iontophoresis '4mg'$ /ml Dexamethasone;Moist Heat;Traction;Balance training;Therapeutic exercise;Therapeutic activities;Functional mobility training;Electrical Stimulation;Stair training;Gait training;DME Instruction;Ultrasound;Neuromuscular re-education;Patient/family education;Passive range of motion;Spinal Manipulations;Joint Manipulations;Dry needling;Taping;Vasopneumatic Device;Manual techniques    PT Next Visit Plan BFR, elevation strengthening.    PT Home Exercise Plan [email or web address]    Consulted and Agree with Plan of Care Patient             Patient will benefit  from skilled therapeutic intervention in order to  improve the following deficits and impairments:  Decreased endurance, Pain, Impaired UE functional use, Decreased strength, Decreased activity tolerance, Decreased range of motion, Impaired flexibility, Impaired perceived functional ability, Decreased mobility, Increased edema  Visit Diagnosis: Chronic right shoulder pain  Muscle weakness (generalized)  Localized edema     Problem List Patient Active Problem List   Diagnosis Date Noted   Former smoker (30 pack/year, quit 2011) 02/07/2021   Complete tear of right rotator cuff 01/18/2021   Labile hypertension 05/28/2020   Osteoarthritis of right AC (acromioclavicular) joint 01/18/2020   Os acromiale of right shoulder 01/18/2020   Carpal tunnel syndrome, left upper limb 01/18/2020   Aortic atherosclerosis ([place]) by Chest CT scan 01/07/2020 01/11/2020   Emphysema lung ([employer]) 01/11/2020   Vitamin D deficiency 11/16/2015   Medication management 11/16/2015   Abnormal glucose 08/22/2014   Hyperlipidemia, mixed 10/05/2013   Rosacea    Anxiety    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 [place], [place], [address] Phone: [phone]   Fax:  [phone]  Name: Kathryn Lamb MRN: [id number] Date of Birth: 04/07/1951

## 2021-04-05 ENCOUNTER — Encounter: Payer: Medicare Other | Admitting: Rehabilitative and Restorative Service Providers"

## 2021-04-10 ENCOUNTER — Encounter: Payer: Self-pay | Admitting: Rehabilitative and Restorative Service Providers"

## 2021-04-10 ENCOUNTER — Ambulatory Visit: Payer: Medicare Other | Admitting: Rehabilitative and Restorative Service Providers"

## 2021-04-10 ENCOUNTER — Other Ambulatory Visit: Payer: Self-pay

## 2021-04-10 DIAGNOSIS — M25511 Pain in right shoulder: Secondary | ICD-10-CM | POA: Diagnosis not present

## 2021-04-10 DIAGNOSIS — M6281 Muscle weakness (generalized): Secondary | ICD-10-CM | POA: Diagnosis not present

## 2021-04-10 DIAGNOSIS — G8929 Other chronic pain: Secondary | ICD-10-CM | POA: Diagnosis not present

## 2021-04-10 DIAGNOSIS — R600 Localized edema: Secondary | ICD-10-CM | POA: Diagnosis not present

## 2021-04-10 NOTE — Therapy (Signed)
[facility] [address] [place], [place], [address] Phone: [phone]   Fax:  [phone]  Physical Therapy Treatment  Patient Details  Name: Kathryn Lamb MRN: [id number] Date of Birth: February 01, 1951 Referring Provider (PT): Dr. Durward Fortes   Encounter Date: 04/10/2021   PT End of Session - 04/10/21 1300     Visit Number 16    Number of Visits 23    Date for PT Re-Evaluation 05/03/21    Authorization Type UHC Medicare    Progress Note Due on Visit 22    PT Start Time 1259    PT Stop Time 1338    PT Time Calculation (min) 39 min    Activity Tolerance Patient tolerated treatment well    Behavior During Therapy [facility] for tasks assessed/performed             Past Medical History:  Diagnosis Date   Anxiety    Depression    Diverticulitis    Ischemic colitis ([place])    Mixed hyperlipidemia 10/05/2013   Rosacea     Past Surgical History:  Procedure Laterality Date   ABDOMINAL HYSTERECTOMY     APPENDECTOMY     CARPAL TUNNEL RELEASE Right 2004   LUMBAR LAMINECTOMY  2000   MICRODISCECTOMY LUMBAR     SHOULDER ARTHROSCOPY WITH ROTATOR CUFF REPAIR Right 12/21/2020   Dr. Durward Fortes   TUBAL LIGATION      There were no vitals filed for this visit.   Subjective Assessment - 04/10/21 1304     Subjective Pt. indicated feeling sore after doing activity but nothing specific to report from resting.    Limitations House hold activities;Sitting;Lifting    Patient Stated Goals Reduce pain    Currently in Pain? No/denies    Pain Score 0-No pain    Pain Onset More than a month ago                               [facility] Adult PT Treatment/Exercise - 04/10/21 0001       Shoulder Exercises: Standing   External Rotation Right   eccentric 3 x 15 c towel at side   Theraband Level (Shoulder External Rotation) Level 3 (Green)    Row Both;15 reps   5 sec hold in retraction   Theraband Level (Shoulder Row) Level 4 (Blue)    Other  Standing Exercises standing flexion wall slides c lift off 2 x10, standing flexion to abduction to side and reverse 2 x 10 below 90 degrees bilateral    Other Standing Exercises standing 90 deg flexion ball circles 30 x 2 cw, ccw each 2lb ball, wall push up 3 x 10 c SA press hold 2 seconds      Shoulder Exercises: ROM/Strengthening   UBE (Upper Arm Bike) Lvl 3.5 4 mins fwd/back each way                      PT Short Term Goals - 01/17/21 1412       PT SHORT TERM GOAL #1   Title Patient will demonstrate independent use of home exercise program to maintain progress from in clinic treatments.    Time 3    Period Weeks    Status Achieved    Target Date 01/23/21               PT Long Term Goals - 03/22/21 1357       PT  LONG TERM GOAL #1   Title Patient will demonstrate/report pain at worst less than or equal to 2/10 to facilitate minimal limitation in daily activity secondary to pain symptoms.    Time 6    Period Weeks    Status Revised    Target Date 05/03/21      PT LONG TERM GOAL #2   Title Patient will demonstrate independent use of home exercise program to facilitate ability to maintain/progress functional gains from skilled physical therapy services.    Time 6    Period Weeks    Status Revised    Target Date 05/03/21      PT LONG TERM GOAL #3   Title Patient will demonstrate Rt [place] joint mobility WFL to facilitate usual self care, dressing, reaching overhead at PLOF s limitation due to symptoms.    Time 10    Period Weeks    Status Achieved      PT LONG TERM GOAL #4   Title Patient will demonstrate Rt UE MMT 4/5 throughout to facilitate usual lifting, carrying in functional activity to PLOF s limitation.    Time 6    Period Weeks    Status Revised    Target Date 05/03/21      PT LONG TERM GOAL #5   Title Pt. will demonstrate FOTO outcome > or = 61 to indicated reduced disability due to condition.    Time 6    Period Weeks    Status Revised     Target Date 05/03/21                   Plan - 04/10/21 1331     Clinical Impression Statement Elevation quality continued to show slow and steady improvement but easy fatigue noted overall.  Pt. can continue to benefit from strengthening program to improve arm function.    Examination-Activity Limitations Sleep;Bathing;Caring for Others;Carry;Toileting;Dressing;Hygiene/Grooming;Lift;Reach Overhead    Examination-Participation Restrictions Driving;Community Activity;Cleaning;Laundry;Yard Work;Meal Prep    Stability/Clinical Decision Making Stable/Uncomplicated    Rehab Potential Good    PT Frequency 2x / week   1x/week for 4 weeks then 2x/week for POC of 10 weeks   PT Duration 8 weeks    PT Treatment/Interventions ADLs/Self Care Home Management;Cryotherapy;Iontophoresis '4mg'$ /ml Dexamethasone;Moist Heat;Traction;Balance training;Therapeutic exercise;Therapeutic activities;Functional mobility training;Electrical Stimulation;Stair training;Gait training;DME Instruction;Ultrasound;Neuromuscular re-education;Patient/family education;Passive range of motion;Spinal Manipulations;Joint Manipulations;Dry needling;Taping;Vasopneumatic Device;Manual techniques    PT Next Visit Plan BFR, elevation strengthening, possible HEP transitioning next visit.    PT Home Exercise Plan [email or web address]    Consulted and Agree with Plan of Care Patient             Patient will benefit from skilled therapeutic intervention in order to improve the following deficits and impairments:  Decreased endurance, Pain, Impaired UE functional use, Decreased strength, Decreased activity tolerance, Decreased range of motion, Impaired flexibility, Impaired perceived functional ability, Decreased mobility, Increased edema  Visit Diagnosis: Chronic right shoulder pain  Muscle weakness (generalized)  Localized edema     Problem List Patient Active Problem List   Diagnosis Date Noted   Former smoker (30 pack/year, quit  2011) 02/07/2021   Complete tear of right rotator cuff 01/18/2021   Labile hypertension 05/28/2020   Osteoarthritis of right AC (acromioclavicular) joint 01/18/2020   Os acromiale of right shoulder 01/18/2020   Carpal tunnel syndrome, left upper limb 01/18/2020   Aortic atherosclerosis ([place]) by Chest CT scan 01/07/2020 01/11/2020   Emphysema lung ([place]) 01/11/2020   Vitamin D deficiency 11/16/2015  Medication management 11/16/2015   Abnormal glucose 08/22/2014   Hyperlipidemia, mixed 10/05/2013   Rosacea    Anxiety    Major depression in full remission [facility])    History of ischemic colitis     Scot Jun, PT, DPT, OCS, ATC 04/10/21  1:33 PM    [facility] [address] [place], [place], [address] Phone: [phone]   Fax:  [phone]  Name: Kathryn Lamb MRN: [id number] Date of Birth: [birth year]

## 2021-04-12 ENCOUNTER — Encounter: Payer: Medicare Other | Admitting: Rehabilitative and Restorative Service Providers"

## 2021-04-17 ENCOUNTER — Other Ambulatory Visit: Payer: Self-pay

## 2021-04-17 ENCOUNTER — Encounter: Payer: Self-pay | Admitting: Rehabilitative and Restorative Service Providers"

## 2021-04-17 ENCOUNTER — Ambulatory Visit: Payer: Medicare Other | Admitting: Rehabilitative and Restorative Service Providers"

## 2021-04-17 DIAGNOSIS — M25511 Pain in right shoulder: Secondary | ICD-10-CM | POA: Diagnosis not present

## 2021-04-17 DIAGNOSIS — R600 Localized edema: Secondary | ICD-10-CM | POA: Diagnosis not present

## 2021-04-17 DIAGNOSIS — G8929 Other chronic pain: Secondary | ICD-10-CM | POA: Diagnosis not present

## 2021-04-17 DIAGNOSIS — M6281 Muscle weakness (generalized): Secondary | ICD-10-CM

## 2021-04-17 NOTE — Therapy (Addendum)
[facility] [address] [place], [place], [address] Phone: [phone]   Fax:  [phone]  Physical Therapy Treatment /Discharge  Patient Details  Name: Kathryn Lamb MRN: [id number] Date of Birth: 1951/07/02 Referring Provider (PT): Dr. Durward Fortes   Encounter Date: 04/17/2021   PT End of Session - 04/17/21 1256     Visit Number 17    Number of Visits 23    Date for PT Re-Evaluation 05/03/21    Authorization Type UHC Medicare    Progress Note Due on Visit 22    PT Start Time 1258    PT Stop Time 1336    PT Time Calculation (min) 38 min    Activity Tolerance Patient tolerated treatment well    Behavior During Therapy [facility] for tasks assessed/performed             Past Medical History:  Diagnosis Date   Anxiety    Depression    Diverticulitis    Ischemic colitis ([place])    Mixed hyperlipidemia 10/05/2013   Rosacea     Past Surgical History:  Procedure Laterality Date   ABDOMINAL HYSTERECTOMY     APPENDECTOMY     CARPAL TUNNEL RELEASE Right 2004   LUMBAR LAMINECTOMY  2000   MICRODISCECTOMY LUMBAR     SHOULDER ARTHROSCOPY WITH ROTATOR CUFF REPAIR Right 12/21/2020   Dr. Durward Fortes   TUBAL LIGATION      There were no vitals filed for this visit.   Subjective Assessment - 04/17/21 1302     Subjective Pt. indicated continued generic shoulder soreness that comes and goes.  Pt. indicated she is reaching into to cabinets now and using arm more.    Limitations House hold activities;Sitting;Lifting    Patient Stated Goals Reduce pain    Currently in Pain? No/denies   no pain   Pain Score 0-No pain    Pain Location Shoulder    Pain Orientation Right    Pain Descriptors / Indicators Sore    Pain Type Chronic pain;Surgical pain    Pain Onset More than a month ago    Pain Frequency Intermittent    Aggravating Factors  sometimes sore after housework and strengthenging intervention    Pain Relieving Factors rest                 [facility] PT Assessment - 04/17/21 0001       Assessment   Medical Diagnosis Rt shoulder pain s/p RTC repair    Referring Provider (PT) Dr. Durward Fortes    Onset Date/Surgical Date 12/21/20    Hand Dominance Right      Observation/Other Assessments   Focus on Therapeutic Outcomes (FOTO)  update 61%      Strength   Right Shoulder Flexion 4/5   13.4, 16.8 lbs   Right Shoulder External Rotation 4/5   13.9, 13.6 lbs                          [facility] Adult PT Treatment/Exercise - 04/17/21 0001       Exercises   Other Exercises  Review of HEP for HEP trial      Shoulder Exercises: Standing   External Rotation Right   eccentric execise 3 x 15   Theraband Level (Shoulder External Rotation) Level 3 (Green)    Row Both   3 x 15   Theraband Level (Shoulder Row) Level 4 (Blue)    Other Standing Exercises standing ER walk out holds  green band 5 sec x 5, standing 90 deg flexion to abd to side and reverse 2 x 10      Shoulder Exercises: ROM/Strengthening   UBE (Upper Arm Bike) Lvl 3.5 3 mins fwd/back each way                    PT Education - 04/17/21 1325     Education Details HEP review    Person(s) Educated Patient    Methods Explanation;Demonstration;Verbal cues;Handout    Comprehension Returned demonstration;Verbalized understanding              PT Short Term Goals - 01/17/21 1412       PT SHORT TERM GOAL #1   Title Patient will demonstrate independent use of home exercise program to maintain progress from in clinic treatments.    Time 3    Period Weeks    Status Achieved    Target Date 01/23/21               PT Long Term Goals - 04/17/21 1326       PT LONG TERM GOAL #1   Title Patient will demonstrate/report pain at worst less than or equal to 2/10 to facilitate minimal limitation in daily activity secondary to pain symptoms.    Time 6    Period Weeks    Status Partially Met    Target Date 05/03/21      PT LONG TERM GOAL #2    Title Patient will demonstrate independent use of home exercise program to facilitate ability to maintain/progress functional gains from skilled physical therapy services.    Time 6    Period Weeks    Status Achieved    Target Date 05/03/21      PT LONG TERM GOAL #3   Title Patient will demonstrate Rt [place] joint mobility WFL to facilitate usual self care, dressing, reaching overhead at PLOF s limitation due to symptoms.    Time 10    Period Weeks    Status Achieved      PT LONG TERM GOAL #4   Title Patient will demonstrate Rt UE MMT 4/5 throughout to facilitate usual lifting, carrying in functional activity to PLOF s limitation.    Time 6    Period Weeks    Status Partially Met    Target Date 05/03/21      PT LONG TERM GOAL #5   Title Pt. will demonstrate FOTO outcome > or = 61 to indicated reduced disability due to condition.    Time 6    Period Weeks    Status Partially Met    Target Date 05/03/21                   Plan - 04/17/21 1313     Clinical Impression Statement Pt. has continued to show slow and steady gains overall in strength and functional activity tolerance.  Pt. was appropriate today for HEP trial at home c skilled PT services in clinic on hold.  Pt. to benefit from continued HEP consisting of strengthening to improve as time passes going forward.  Pt. in agreement.    Examination-Activity Limitations Sleep;Bathing;Caring for Others;Carry;Toileting;Dressing;Hygiene/Grooming;Lift;Reach Overhead    Examination-Participation Restrictions Driving;Community Activity;Cleaning;Laundry;Yard Work;Meal Prep    Stability/Clinical Decision Making Stable/Uncomplicated    Rehab Potential Good    PT Frequency 2x / week   1x/week for 4 weeks then 2x/week for POC of 10 weeks   PT Duration 8 weeks  PT Treatment/Interventions ADLs/Self Care Home Management;Cryotherapy;Iontophoresis 8m/ml Dexamethasone;Moist Heat;Traction;Balance training;Therapeutic exercise;Therapeutic  activities;Functional mobility training;Electrical Stimulation;Stair training;Gait training;DME Instruction;Ultrasound;Neuromuscular re-education;Patient/family education;Passive range of motion;Spinal Manipulations;Joint Manipulations;Dry needling;Taping;Vasopneumatic Device;Manual techniques    PT Next Visit Plan Hold PT in clinic for trial HEP d/c.    PT Home Exercise Plan [email or web address]    Consulted and Agree with Plan of Care Patient             Patient will benefit from skilled therapeutic intervention in order to improve the following deficits and impairments:  Decreased endurance, Pain, Impaired UE functional use, Decreased strength, Decreased activity tolerance, Decreased range of motion, Impaired flexibility, Impaired perceived functional ability, Decreased mobility, Increased edema  Visit Diagnosis: Chronic right shoulder pain  Muscle weakness (generalized)  Localized edema     Problem List Patient Active Problem List   Diagnosis Date Noted   Former smoker (30 pack/year, quit 2011) 02/07/2021   Complete tear of right rotator cuff 01/18/2021   Labile hypertension 05/28/2020   Osteoarthritis of right AC (acromioclavicular) joint 01/18/2020   Os acromiale of right shoulder 01/18/2020   Carpal tunnel syndrome, left upper limb 01/18/2020   [place] 01/07/2020 01/11/2020   [place] 01/11/2020   Vitamin D deficiency 11/16/2015   Medication management 11/16/2015   Abnormal glucose 08/22/2014   Hyperlipidemia, mixed 10/05/2013   Rosacea    Anxiety    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    History of ischemic colitis     MScot Jun PT, DPT, OCS, ATC 04/17/21  1:31 PM  PHYSICAL THERAPY DISCHARGE SUMMARY  Visits from Start of Care: 17  Current functional level related to goals / functional outcomes: See note   Remaining deficits: See note   Education / Equipment: HEP   Patient agrees to discharge. Patient  goals were partially met. Patient is being discharged due to being pleased with the current functional level.  MScot Jun PT, DPT, OCS, ATC 05/03/21  1:24 PM     [facility] [address] [address]Phone: [phone]  Fax:  [phone] Name: Kathryn LOUDMRN: [id number] of Birth: 2February 07, 1952

## 2021-04-17 NOTE — Patient Instructions (Signed)
Access Code: [email or web address] URL: [email or web address] Date: 04/17/2021 Prepared by: Scot Jun  Exercises Seated Scapular Retraction - 2 x daily - 7 x weekly - 2 sets - 10 reps - 5 hold Sidelying Shoulder ER with Towel and Dumbbell - 1 x daily - 7 x weekly - 3 sets - 10-15 reps Standing Shoulder Flexion to 90 Degrees with Dumbbells - 1 x daily - 7 x weekly - 3 sets - 10-15 reps Shoulder Abduction with Dumbbells - Thumbs Up - 1 x daily - 7 x weekly - 3 sets - 10-15 reps Shoulder External Rotation with Anchored Resistance - 1 x daily - 7 x weekly - 3 sets - 10-15 reps Standing Row with Anchored Resistance - 1 x daily - 7 x weekly - 3 sets - 10-15 reps Wall Push Up with Plus - 1 x daily - 7 x weekly - 3 sets - 10-15 reps

## 2021-04-19 ENCOUNTER — Encounter: Payer: Medicare Other | Admitting: Rehabilitative and Restorative Service Providers"

## 2021-05-24 ENCOUNTER — Other Ambulatory Visit: Payer: Self-pay | Admitting: Adult Health Nurse Practitioner

## 2021-06-13 ENCOUNTER — Encounter: Payer: Self-pay | Admitting: Internal Medicine

## 2021-06-13 NOTE — Progress Notes (Signed)
Annual Screening/Preventative Visit & Comprehensive Evaluation &  Examination  Future Appointments  Date Time Provider [place]  06/14/2021      CPE 11:00 AM Unk Pinto, MD [facility] None  02/07/2022      Wellness 11:30 AM Liane Comber, NP [facility] None  06/14/2022      CPE 11:00 AM Unk Pinto, MD [facility] None        This very nice 70 y.o. MWF  presents for a Screening /Preventative Visit & comprehensive evaluation and management of multiple medical co-morbidities.  Patient has been followed for HTN, HLD, Prediabetes  and Vitamin D Deficiency. CT scan in July 2022  showed Aortic Atherosclerosis.  Patient has GERD Controlled on her meds. She also has IBS.  Patient also has hx/o Major Depression in Remission.          Patient is followed expectantly for Labile HTN.   Patient's BP has been controlled at home and patient denies any cardiac symptoms as chest pain, palpitations, shortness of breath, dizziness or ankle swelling. Today's BP is at goal -  138/78.        Patient's hyperlipidemia is controlled with diet and medications. Patient denies myalgias or other medication SE's. Last lipids were at goal except elevated Trig's :  Lab Results  Component Value Date   CHOL 186 12/07/2020   HDL 47 (L) 12/07/2020   LDLCALC 99 12/07/2020   TRIG 300 (H) 12/07/2020   CHOLHDL 4.0 12/07/2020         Patient has hx/o prediabetes (A1c 5.7% /2016) and patient denies reactive hypoglycemic symptoms, visual blurring, diabetic polys or paresthesias. Last A1c was near goal :  Lab Results  Component Value Date   HGBA1C 5.8 (H) 12/07/2020         Finally, patient has history of Vitamin D Deficiency ("41" /2019) and last Vitamin D was at goal :  Lab Results  Component Value Date   VD25OH 81 12/07/2020     Current Outpatient Medications on File Prior to Visit  Medication Sig   aspirin EC 81 MG tablet Take daily   atorvastatin 80 MG tablet TAKE 1 TABLET   DAILY     buPROPion  XL) 300 MG  TAKE 1 TABLET  IN  THE MORNING    cetirizine 10 MG tablet Take  daily.   VITAMIN D  5000 u Take 1 capsule Daily   cyanocobalamin 1000 MCG  Take  daily.   diclofenac  1 % GEL Apply  2 grams   2 x /day  1 to Painful Joint   famotidine 40 MG tablet TAKE 1 TABLET  DAILY    FLAXSEED OIL  1,300 mg Take  daily   gabapentin 100 MG capsule TAKE 2 CAPS 3  x  /DAILY AS NEEDED    Multiple Vitamin/minerals Takes 3 times per week. M,W,F   Probiotic Product  Take  daily.     Allergies  Allergen Reactions   Doxycycline    Erythromycin    Flagyl [Metronidazole]    Penicillins    Sulfa Antibiotics      Past Medical History:  Diagnosis Date   Anxiety    Depression    Diverticulitis    Ischemic colitis ([place])    Mixed hyperlipidemia 10/05/2013   Rosacea      Health Maintenance  Topic Date Due   Zoster Vaccines- Shingrix (1 of 2) Never done   Pneumonia Vaccine 49+ Years old  10/04/2018   COVID-19 Vaccine (3 -  Moderna risk series) 06/26/2020   INFLUENZA VACCINE  03/12/2021   MAMMOGRAM  07/10/2021   TETANUS/TDAP  04/07/2022   COLONOSCOPY  12/16/2027   DEXA SCAN  Completed   Hepatitis C Screening  Completed   HPV VACCINES  Aged Out     Immunization History  Administered Date(s) Administered   Influenza Split 05/17/2013, 05/17/2014   Influenza, High Dose  05/30/2017, 06/01/2018, 08/02/2019   Influenza 06/03/2018   Moderna Sars-Covid-2 Vacc 05/01/2020, 05/29/2020   PPD Test 05/04/2014   Pneumococcal -13 05/09/2015   Pneumococcal -23 10/04/2013   Td 08/30/2002   Tdap 04/07/2012   Varicella Zoster Immune Globulin 03/15/2016    Last Colon - 12/15/2017 - Dr Earlean Shawl - Mod /Severe Diverticulosis- Recc 10 yr f/u due May 2029  Last MGM - 07/10/2020   Last BMD - 03/24/2019 - Osteopenia Lt Femur  T =   -1.5   Past Surgical History:  Procedure Laterality Date   ABDOMINAL HYSTERECTOMY     APPENDECTOMY     CARPAL TUNNEL RELEASE Right 2004   LUMBAR LAMINECTOMY   2000   MICRODISCECTOMY LUMBAR     SHOULDER ARTHROSCOPY WITH ROTATOR CUFF REPAIR Right 12/21/2020   Dr. Durward Fortes   TUBAL LIGATION       Family History  Problem Relation Age of Onset   Diabetes Father    Alcohol abuse Father    Cancer Father        prostate, lung   Hypertension Mother    Stroke Mother    Heart disease Mother    Diabetes Sister      Social History   Tobacco Use   Smoking status: Former    Packs/day: 1.00    Years: 30.00    Pack years: 30.00    Types: Cigarettes    Quit date: 10/04/2008    Years since quitting: 12.6   Smokeless tobacco: Never  Vaping Use   Vaping Use: Never used  Substance Use Topics   Alcohol use: No   Drug use: No      ROS Constitutional: Denies fever, chills, weight loss/gain, headaches, insomnia,  night sweats, and change in appetite. Does c/o fatigue. Eyes: Denies redness, blurred vision, diplopia, discharge, itchy, watery eyes.  ENT: Denies discharge, congestion, post nasal drip, epistaxis, sore throat, earache, hearing loss, dental pain, Tinnitus, Vertigo, Sinus pain, snoring.  Cardio: Denies chest pain, palpitations, irregular heartbeat, syncope, dyspnea, diaphoresis, orthopnea, PND, claudication, edema Respiratory: denies cough, dyspnea, DOE, pleurisy, hoarseness, laryngitis, wheezing.  Gastrointestinal: Denies dysphagia, heartburn, reflux, water brash, pain, cramps, nausea, vomiting, bloating, diarrhea, constipation, hematemesis, melena, hematochezia, jaundice, hemorrhoids Genitourinary: Denies dysuria, frequency, urgency, nocturia, hesitancy, discharge, hematuria, flank pain Breast: Breast lumps, nipple discharge, bleeding.  Musculoskeletal: Denies arthralgia, myalgia, stiffness, Jt. Swelling, pain, limp, and strain/sprain. Denies falls. Skin: Denies puritis, rash, hives, warts, acne, eczema, changing in skin lesion Neuro: No weakness, tremor, incoordination, spasms, paresthesia, pain Psychiatric: Denies confusion, memory  loss, sensory loss. Denies Depression. Endocrine: Denies change in weight, skin, hair change, nocturia, and paresthesia, diabetic polys, visual blurring, hyper / hypo glycemic episodes.  Heme/Lymph: No excessive bleeding, bruising, enlarged lymph nodes.  Physical Exam  BP 138/78   Pulse 69   Temp (!) 97 F (36.1 C)   Resp 16   Ht 5\' 3"  (1.6 m)   Wt 150 lb 6.4 oz (68.2 kg)   SpO2 98%   BMI 26.64 kg/m   General Appearance: Well nourished, well groomed and in no apparent distress.  Eyes: PERRLA, EOMs,  conjunctiva no swelling or erythema, normal fundi and vessels. Sinuses: No frontal/maxillary tenderness ENT/Mouth: EACs patent / TMs  nl. Nares clear without erythema, swelling, mucoid exudates. Oral hygiene is good. No erythema, swelling, or exudate. Tongue normal, non-obstructing. Tonsils not swollen or erythematous. Hearing normal.  Neck: Supple, thyroid not palpable. No bruits, nodes or JVD. Respiratory: Respiratory effort normal.  BS equal and clear bilateral without rales, rhonci, wheezing or stridor. Cardio: Heart sounds are normal with regular rate and rhythm and no murmurs, rubs or gallops. Peripheral pulses are normal and equal bilaterally without edema. No aortic or femoral bruits. Chest: symmetric with normal excursions and percussion. Breasts: Symmetric, without lumps, nipple discharge, retractions, or fibrocystic changes.  Abdomen: Flat, soft with bowel sounds active. Nontender, no guarding, rebound, hernias, masses, or organomegaly.  Lymphatics: Non tender without lymphadenopathy.  Genitourinary:  Musculoskeletal: Full ROM all peripheral extremities, joint stability, 5/5 strength, and normal gait. Skin: Warm and dry without rashes, lesions, cyanosis, clubbing or  ecchymosis.  Neuro: Cranial nerves intact, reflexes equal bilaterally. Normal muscle tone, no cerebellar symptoms. Sensation intact.  Pysch: Alert and oriented X 3, normal affect, Insight and Judgment appropriate.     Assessment and Plan  1. Annual Preventative Screening Examination   2. Labile hypertension  - EKG 12-Lead - Urinalysis, Routine w reflex microscopic - Microalbumin / creatinine urine ratio - CBC with Differential/Platelet - COMPLETE METABOLIC PANEL WITH GFR - Magnesium - TSH  3. Hyperlipidemia, mixed  - EKG 12-Lead - Lipid panel - TSH  4. Abnormal glucose  - EKG 12-Lead - Hemoglobin A1c - Insulin, random  5. Vitamin D deficiency  - VITAMIN D 25 Hydroxy   6. Aortic atherosclerosis ([place]) by Chest CT scan 01/07/2020  - EKG 12-Lead - Lipid panel  7. Recurrent major depressive disorder, in full remission ([place])  - TSH  8. Screening for colorectal cancer  - POC Hemoccult Bld/Stl   9. Screening for ischemic heart disease  - EKG 12-Lead  10. FHx: heart disease  - EKG 12-Lead  11. Former smoker  - EKG 12-Lead  12. Medication management  - Urinalysis, Routine w reflex microscopic - Microalbumin / creatinine urine ratio - CBC with Differential/Platelet - COMPLETE METABOLIC PANEL WITH GFR - Magnesium - Lipid panel - TSH - Hemoglobin A1c - Insulin, random - VITAMIN D 25 Hydroxy   13. Onychomycosis  - terbinafine 50 MG tablet Take 1 tablet Daily for Nail Fungus   Dispense: 90 tablet; Refill:           Patient was counseled in prudent diet to achieve/maintain BMI less than 25 for weight control, BP monitoring, regular exercise and medications. Discussed med's effects and SE's. Screening labs and tests as requested with regular follow-up as recommended. Over 40 minutes of exam, counseling, chart review and high complex critical decision making was performed.   Kirtland Bouchard, MD

## 2021-06-13 NOTE — Patient Instructions (Signed)

## 2021-06-14 ENCOUNTER — Encounter: Payer: Self-pay | Admitting: Internal Medicine

## 2021-06-14 ENCOUNTER — Other Ambulatory Visit: Payer: Self-pay

## 2021-06-14 ENCOUNTER — Ambulatory Visit (INDEPENDENT_AMBULATORY_CARE_PROVIDER_SITE_OTHER): Payer: Medicare Other | Admitting: Internal Medicine

## 2021-06-14 VITALS — BP 138/78 | HR 69 | Temp 97.0°F | Resp 16 | Ht 63.0 in | Wt 150.4 lb

## 2021-06-14 DIAGNOSIS — Z1211 Encounter for screening for malignant neoplasm of colon: Secondary | ICD-10-CM

## 2021-06-14 DIAGNOSIS — Z136 Encounter for screening for cardiovascular disorders: Secondary | ICD-10-CM | POA: Diagnosis not present

## 2021-06-14 DIAGNOSIS — B351 Tinea unguium: Secondary | ICD-10-CM

## 2021-06-14 DIAGNOSIS — I700 Atherosclerosis of aorta: Secondary | ICD-10-CM

## 2021-06-14 DIAGNOSIS — Z79899 Other long term (current) drug therapy: Secondary | ICD-10-CM

## 2021-06-14 DIAGNOSIS — Z0000 Encounter for general adult medical examination without abnormal findings: Secondary | ICD-10-CM | POA: Diagnosis not present

## 2021-06-14 DIAGNOSIS — E559 Vitamin D deficiency, unspecified: Secondary | ICD-10-CM

## 2021-06-14 DIAGNOSIS — Z87891 Personal history of nicotine dependence: Secondary | ICD-10-CM

## 2021-06-14 DIAGNOSIS — Z0001 Encounter for general adult medical examination with abnormal findings: Secondary | ICD-10-CM

## 2021-06-14 DIAGNOSIS — R7309 Other abnormal glucose: Secondary | ICD-10-CM

## 2021-06-14 DIAGNOSIS — Z8249 Family history of ischemic heart disease and other diseases of the circulatory system: Secondary | ICD-10-CM | POA: Diagnosis not present

## 2021-06-14 DIAGNOSIS — E782 Mixed hyperlipidemia: Secondary | ICD-10-CM | POA: Diagnosis not present

## 2021-06-14 DIAGNOSIS — R0989 Other specified symptoms and signs involving the circulatory and respiratory systems: Secondary | ICD-10-CM | POA: Diagnosis not present

## 2021-06-14 DIAGNOSIS — Z1212 Encounter for screening for malignant neoplasm of rectum: Secondary | ICD-10-CM

## 2021-06-14 DIAGNOSIS — F3342 Major depressive disorder, recurrent, in full remission: Secondary | ICD-10-CM

## 2021-06-14 MED ORDER — TERBINAFINE HCL 250 MG PO TABS: ORAL_TABLET | ORAL | 1 refills | Status: DC

## 2021-06-15 LAB — LIPID PANEL
Cholesterol: 167 mg/dL (ref <200)
HDL: 48 mg/dL — ABNORMAL LOW (ref >=50)
LDL Cholesterol (Calc): 93 mg/dL (calc)
Non-HDL Cholesterol (Calc): 119 mg/dL (calc) (ref <130)
Total CHOL/HDL Ratio: 3.5 (calc) (ref <5.0)
Triglycerides: 163 mg/dL — ABNORMAL HIGH (ref <150)

## 2021-06-15 LAB — HEMOGLOBIN A1C
Hgb A1c MFr Bld: 5.7 % of total Hgb — ABNORMAL HIGH (ref <5.7)
Mean Plasma Glucose: 117 mg/dL
eAG (mmol/L): 6.5 mmol/L

## 2021-06-15 LAB — COMPLETE METABOLIC PANEL WITH GFR
AG Ratio: 2.2 (calc) (ref 1.0–2.5)
ALT: 27 U/L (ref 6–29)
AST: 20 U/L (ref 10–35)
Albumin: 4.7 g/dL (ref 3.6–5.1)
Alkaline phosphatase (APISO): 60 U/L (ref 37–153)
BUN: 12 mg/dL (ref 7–25)
CO2: 29 mmol/L (ref 20–32)
Calcium: 10 mg/dL (ref 8.6–10.4)
Chloride: 104 mmol/L (ref 98–110)
Creat: 0.74 mg/dL (ref 0.60–1.00)
Globulin: 2.1 g/dL (calc) (ref 1.9–3.7)
Glucose, Bld: 98 mg/dL (ref 65–99)
Potassium: 4.2 mmol/L (ref 3.5–5.3)
Sodium: 141 mmol/L (ref 135–146)
Total Bilirubin: 0.8 mg/dL (ref 0.2–1.2)
Total Protein: 6.8 g/dL (ref 6.1–8.1)
eGFR: 86 mL/min/{1.73_m2} (ref >=60)

## 2021-06-15 LAB — TSH: TSH: 2.95 mIU/L (ref 0.40–4.50)

## 2021-06-15 LAB — MICROALBUMIN / CREATININE URINE RATIO
Creatinine, Urine: 29 mg/dL (ref 20–275)
Microalb Creat Ratio: 14 mcg/mg creat (ref <30)
Microalb, Ur: 0.4 mg/dL

## 2021-06-15 LAB — URINALYSIS, ROUTINE W REFLEX MICROSCOPIC
Bacteria, UA: NONE SEEN /HPF
Bilirubin Urine: NEGATIVE
Glucose, UA: NEGATIVE
Hgb urine dipstick: NEGATIVE
Hyaline Cast: NONE SEEN /LPF
Ketones, ur: NEGATIVE
Nitrite: NEGATIVE
Protein, ur: NEGATIVE
RBC / HPF: NONE SEEN /HPF (ref 0–2)
Specific Gravity, Urine: 1.006 (ref 1.001–1.035)
pH: 7 (ref 5.0–8.0)

## 2021-06-15 LAB — CBC WITH DIFFERENTIAL/PLATELET
Absolute Monocytes: 286 cells/uL (ref 200–950)
Basophils Absolute: 40 cells/uL (ref 0–200)
Basophils Relative: 0.9 %
Eosinophils Absolute: 167 cells/uL (ref 15–500)
Eosinophils Relative: 3.8 %
HCT: 37.4 % (ref 35.0–45.0)
Hemoglobin: 12.5 g/dL (ref 11.7–15.5)
Lymphs Abs: 2209 cells/uL (ref 850–3900)
MCH: 30.4 pg (ref 27.0–33.0)
MCHC: 33.4 g/dL (ref 32.0–36.0)
MCV: 91 fL (ref 80.0–100.0)
MPV: 9.6 fL (ref 7.5–12.5)
Monocytes Relative: 6.5 %
Neutro Abs: 1698 cells/uL (ref 1500–7800)
Neutrophils Relative %: 38.6 %
Platelets: 279 10*3/uL (ref 140–400)
RBC: 4.11 10*6/uL (ref 3.80–5.10)
RDW: 12.5 % (ref 11.0–15.0)
Total Lymphocyte: 50.2 %
WBC: 4.4 10*3/uL (ref 3.8–10.8)

## 2021-06-15 LAB — VITAMIN D 25 HYDROXY (VIT D DEFICIENCY, FRACTURES): Vit D, 25-Hydroxy: 88 ng/mL (ref 30–100)

## 2021-06-15 LAB — MAGNESIUM: Magnesium: 2.2 mg/dL (ref 1.5–2.5)

## 2021-06-15 LAB — INSULIN, RANDOM: Insulin: 12 u[IU]/mL

## 2021-06-15 LAB — MICROSCOPIC MESSAGE

## 2021-06-15 NOTE — Progress Notes (Signed)
============================================================ -   Test results slightly outside the reference range are not unusual. If there is anything important, I will review this with you,  otherwise it is considered normal test values.  If you have further questions,  please do not hesitate to contact me at the office or via My Chart.  ============================================================ ============================================================  -  U/A shows a few WBC, But OK since Nitrates is Negative ============================================================ ============================================================  -   -  Total  Chol =    167    -             (  Ideal  or  Goal is less than 180  !  )   - and   -  Bad / Dangerous LDL  Chol =  93    - also Elevated   - Both  Excellent   - Very low risk for Heart Attack  / Stroke ============================================================ ============================================================  -  A1c = 5.7% - borderline abnormal  / sl high sugar    - Avoid Sweets, Candy & White Stuff   - White Rice, White [place], White Flour  - Breads &  Pasta ============================================================ ============================================================  -  Vitamin  D = 88 - Excellent   -  Please keep dose same  ============================================================ ============================================================  -  All Else - CBC - Kidneys - Electrolytes - Liver - Magnesium & Thyroid    - all  Normal / OK ============================================================ ============================================================  -  Keep up the [place] Work  !  ============================================================ ============================================================

## 2021-08-29 DIAGNOSIS — L718 Other rosacea: Secondary | ICD-10-CM | POA: Diagnosis not present

## 2021-08-29 DIAGNOSIS — L820 Inflamed seborrheic keratosis: Secondary | ICD-10-CM | POA: Diagnosis not present

## 2021-08-29 DIAGNOSIS — L570 Actinic keratosis: Secondary | ICD-10-CM | POA: Diagnosis not present

## 2021-08-29 DIAGNOSIS — X32XXXA Exposure to sunlight, initial encounter: Secondary | ICD-10-CM | POA: Diagnosis not present

## 2021-09-10 ENCOUNTER — Other Ambulatory Visit: Payer: Self-pay | Admitting: Adult Health Nurse Practitioner

## 2022-01-21 DIAGNOSIS — D3122 Benign neoplasm of left retina: Secondary | ICD-10-CM | POA: Diagnosis not present

## 2022-01-21 DIAGNOSIS — D3121 Benign neoplasm of right retina: Secondary | ICD-10-CM | POA: Diagnosis not present

## 2022-02-07 ENCOUNTER — Encounter: Payer: Self-pay | Admitting: Adult Health

## 2022-02-07 ENCOUNTER — Ambulatory Visit (INDEPENDENT_AMBULATORY_CARE_PROVIDER_SITE_OTHER): Payer: Medicare Other | Admitting: Adult Health

## 2022-02-07 VITALS — BP 128/74 | HR 71 | Temp 97.7°F | Wt 149.8 lb

## 2022-02-07 DIAGNOSIS — M8580 Other specified disorders of bone density and structure, unspecified site: Secondary | ICD-10-CM

## 2022-02-07 DIAGNOSIS — Z0001 Encounter for general adult medical examination with abnormal findings: Secondary | ICD-10-CM | POA: Diagnosis not present

## 2022-02-07 DIAGNOSIS — J439 Emphysema, unspecified: Secondary | ICD-10-CM

## 2022-02-07 DIAGNOSIS — E559 Vitamin D deficiency, unspecified: Secondary | ICD-10-CM | POA: Diagnosis not present

## 2022-02-07 DIAGNOSIS — R7309 Other abnormal glucose: Secondary | ICD-10-CM | POA: Diagnosis not present

## 2022-02-07 DIAGNOSIS — R6889 Other general symptoms and signs: Secondary | ICD-10-CM | POA: Diagnosis not present

## 2022-02-07 DIAGNOSIS — F3342 Major depressive disorder, recurrent, in full remission: Secondary | ICD-10-CM

## 2022-02-07 DIAGNOSIS — Z87891 Personal history of nicotine dependence: Secondary | ICD-10-CM

## 2022-02-07 DIAGNOSIS — R0989 Other specified symptoms and signs involving the circulatory and respiratory systems: Secondary | ICD-10-CM

## 2022-02-07 DIAGNOSIS — Z79899 Other long term (current) drug therapy: Secondary | ICD-10-CM | POA: Diagnosis not present

## 2022-02-07 DIAGNOSIS — Z8719 Personal history of other diseases of the digestive system: Secondary | ICD-10-CM

## 2022-02-07 DIAGNOSIS — I700 Atherosclerosis of aorta: Secondary | ICD-10-CM

## 2022-02-07 DIAGNOSIS — Z1231 Encounter for screening mammogram for malignant neoplasm of breast: Secondary | ICD-10-CM

## 2022-02-07 DIAGNOSIS — E782 Mixed hyperlipidemia: Secondary | ICD-10-CM | POA: Diagnosis not present

## 2022-02-07 DIAGNOSIS — Z0000 Encounter for general adult medical examination without abnormal findings: Secondary | ICD-10-CM

## 2022-02-07 NOTE — Progress Notes (Signed)
MEDICARE WELLNESS AND FOLLOW UP  Assessment:   Annual Medicare Wellness Visit Due annually  Health maintenance reviewed  - schedule mammogram and DEXA - low dose chest CT ordered for this year - declines shingrix   Atherosclerosis of aorta ([place]) - Ct scan 01/07/2020 Control blood pressure, cholesterol, glucose, increase exercise.   Recurrent major depressive disorder, in full remission ([place]) Remission with wellbutrin, continue medication  Former smoker (30 pack year history, quit 2011) -lung cancer screening with low dose CT discussed as recommended by guidelines based on age, number of pack year history.  Discussed risks of screening including but not limited to false positives on xray, further testing or consultation with specialist, and possible false negative CT as well. Understanding expressed and wishes to proceed with CT testing. Order placed.  -     CT CHEST LUNG CANCER SCREENING LOW DOSE WO CONTRAST; Future  Emphysema ([place]) Former smoker, per imaging, doing well with mucinex PRN  Anxiety controlled  Mixed hyperlipidemia -continue medications, check lipids, decrease fatty foods, increase activity.  - discussed normal LDL <100 - Lipid panel - deferred last <90 days  Abnormal glucose (prediabetes) Discussed disease and risks Discussed diet/exercise, weight management  Check A1C q15m defer today   Rosacea Follows with Derm  Hx of Ischemic colitis Controlled without recent episodes, avoid triggers, continue follow up GI as recommended  Vitamin D deficiency Continue supplement  Medication management CBC, CMP/GFR, mangesium PRM  Bilateral low back pain without sciatica Controlled, continue gabapentin PRN  Labile htn Recently fairly controlled by lifestyle  Monitor blood pressure at home; call if consistently over 130/80 Continue DASH diet.   Reminder to go to the ER if any CP, SOB, nausea, dizziness, severe HA, changes vision/speech, left arm numbness and  tingling and jaw pain.  Osteopenia - get dexa, discussed Vit D and Ca, weight bearing exercises  Congestion Saline irrigations, flonase; follow up if not resolving.   Future Appointments  Date Time Provider DTovey 06/14/2022 11:00 AM MUnk Pinto MD [facility] None    Plan:   During the course of the visit the patient was educated and counseled about appropriate screening and preventive services including:   Pneumococcal vaccine  Prevnar 13 Influenza vaccine Td vaccine Screening electrocardiogram Bone densitometry screening Colorectal cancer screening Diabetes screening Glaucoma screening Nutrition counseling  Advanced directives: requested   Over 30 minutes of exam, counseling, chart review, and critical decision making was performed Future Appointments  Date Time Provider DBangor 06/14/2022 11:00 AM MUnk Pinto MD [facility] None     Subjective:   Kathryn Lamb a 71y.o. female who presents for wellness and 3 month follow up on hypertension, prediabetes, hyperlipidemia, vitamin D def.   MDD for many years, she has on wellbutrin 300 mg and feels she is doing well, wants to continue.   She is a former smoker, quit in 2010 with 30 pack/year smoking history. She had low dose lung cancer screening CT 03/08/2021 which showed emphysematous changes  She has R shoulder arthroscopic rotator cuff repair by Dr. WGarnette Czech5/07/2021, completed PT without residual loss of function.   BMI is Body mass index is 26.54 kg/m., she has been working on diet and exercise, will walk 20 min 3 days a week, working to increase that.  Wt Readings from Last 3 Encounters:  02/07/22 149 lb 12.8 oz (67.9 kg)  06/14/21 150 lb 6.4 oz (68.2 kg)  03/22/21 150 lb (68 kg)   Her blood pressure  has been controlled at home (120s-130s/80s), today their BP is BP: 128/74 She does workout. She denies chest pain, shortness of breath, dizziness.   BP Readings from Last  3 Encounters:  02/07/22 128/74  06/14/21 138/78  02/07/21 124/72   She has aortic atherosclerosis and CAD per CT 12/2019.   She is on cholesterol medication, atorvastatin 80 mg daily, and denies myalgias. Her cholesterol is at goal. The cholesterol last visit was:   Lab Results  Component Value Date   CHOL 167 06/14/2021   HDL 48 (L) 06/14/2021   LDLCALC 93 06/14/2021   TRIG 163 (H) 06/14/2021   CHOLHDL 3.5 06/14/2021   She has been working on diet and exercise for prediabetes, and denies paresthesia of the feet, polydipsia, polyuria and visual disturbances. Last A1C in the office was:  Lab Results  Component Value Date   HGBA1C 5.7 (H) 06/14/2021   Patient is on Vitamin D supplement. Lab Results  Component Value Date   VD25OH 88 06/14/2021       Medication Review   Current Outpatient Medications (Cardiovascular):    atorvastatin (LIPITOR) 80 MG tablet, TAKE 1 TABLET BY MOUTH  DAILY FOR CHOLESTEROL  Current Outpatient Medications (Respiratory):    cetirizine (ZYRTEC) 10 MG tablet, Take 10 mg by mouth daily.  Current Outpatient Medications (Analgesics):    aspirin EC 81 MG tablet, Take 81 mg by mouth daily. Swallow whole.  Current Outpatient Medications (Hematological):    cyanocobalamin 1000 MCG tablet, Take 1,000 mcg by mouth daily.  Current Outpatient Medications (Other):    buPROPion (WELLBUTRIN XL) 300 MG 24 hr tablet, TAKE 1 TABLET BY MOUTH IN  THE MORNING FOR MOOD, FOCUS AND CONCENTRATION   Cholecalciferol (VITAMIN D) 125 MCG (5000 UT) CAPS, Take 1 capsule Daily   diclofenac Sodium (VOLTAREN) 1 % GEL, Apply  2 grams   2 x /day  1 to Painful Joint   famotidine (PEPCID) 40 MG tablet, TAKE 1 TABLET BY MOUTH  DAILY FOR ACID REFLUX AND  INDIGESTION   gabapentin (NEURONTIN) 100 MG capsule, TAKE 2 CAPSULES BY MOUTH 3  TIMES DAILY AS NEEDED FOR  PAIN   Multiple Vitamin (MULTIVITAMIN WITH MINERALS) TABS tablet, Take 1 tablet by mouth. Takes 3 times per week. M,W,F    Probiotic Product (PROBIOTIC DAILY PO), Take by mouth daily.  Current Problems (verified) Patient Active Problem List   Diagnosis Date Noted   Former smoker (30 pack/year, quit 2011) 02/07/2021   Labile hypertension 05/28/2020   Osteoarthritis of right AC (acromioclavicular) joint 01/18/2020   Os acromiale of right shoulder 01/18/2020   Carpal tunnel syndrome, left upper limb 01/18/2020   Aortic atherosclerosis ([place]) by Chest CT scan 01/07/2020 01/11/2020   Emphysema lung ([place]) 01/11/2020   Vitamin D deficiency 11/16/2015   Medication management 11/16/2015   Abnormal glucose 08/22/2014   Hyperlipidemia, mixed 10/05/2013   Rosacea    Anxiety    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[place])    History of ischemic colitis     Screening Tests Immunization History  Administered Date(s) Administered   Influenza Split 05/17/2013, 05/17/2014   Influenza, High Dose Seasonal PF 05/09/2015, 06/18/2016, 05/30/2017, 06/01/2018, 08/02/2019   Influenza-Unspecified 06/03/2018   Moderna Sars-Covid-2 Vaccination 05/01/2020, 05/29/2020   PPD Test 05/04/2014   Pneumococcal Conjugate-13 05/09/2015   Pneumococcal Polysaccharide-23 10/04/2013   Td 08/30/2002   Tdap 04/07/2012   Varicella Zoster Immune Globulin 03/15/2016   Last colonoscopy: 12/2017- never had polyps, GI states for her to not have  another- very tortuous she needed peds scope and had a very difficult exam- will discuss other options in 10 years.   Last mammogram: 06/2020, wants every other year, DUE order placed -  Last pap smear/pelvic exam: 2014, remote hysterectomy, DONE DEXA: 03/2019 T -1.5 - declines this year, plan to get in 2023, lifestyle discussed  Shingles/Zostavax: 2017, deferring shingrix for now  Names of Other Physician/Practitioners you currently use: 1. [facility]- here for primary care 2. Dr. Gwynn Burly, eye doctor, last visit 01/2022, monitoring mild cataracts, glasses, goes  annually 3. Dr. Leonides Sake, dentist, last visit 2023, goes q4m4. Dr. [facility] [facility] derm, last 2022, looking for new provider  Patient Care Team: MUnk Pinto MD as PCP - General (Internal Medicine) MRichmond Campbell MD as Consulting Physician (Gastroenterology) HAllyn Kenner MD as Consulting Physician (Dermatology) WGarald Balding MD as Consulting Physician (Orthopedic Surgery)  Allergies Allergies  Allergen Reactions   Doxycycline    Erythromycin    Flagyl [Metronidazole]    Penicillins    Sulfa Antibiotics    SURGICAL HISTORY She  has a past surgical history that includes Abdominal hysterectomy; Tubal ligation; Lumbar laminectomy (2000); Carpal tunnel release (Right, 2004); Appendectomy; Microdiscectomy lumbar; and Shoulder arthroscopy with rotator cuff repair (Right, 12/21/2020).   FAMILY HISTORY Her family history includes Alcohol abuse in her father; Cancer in her father; Diabetes in her father and sister; Heart disease in her mother; Hypertension in her mother; Stroke in her mother.   SOCIAL HISTORY She  reports that she quit smoking about 13 years ago. Her smoking use included cigarettes. She has a 30.00 pack-year smoking history. She has never used smokeless tobacco. She reports that she does not drink alcohol and does not use drugs.  MEDICARE WELLNESS OBJECTIVES: Physical activity: Current Exercise Habits: Home exercise routine, Type of exercise: walking, Time (Minutes): 25, Frequency (Times/Week): 2, Weekly Exercise (Minutes/Week): 50, Intensity: Mild, Exercise limited by: None identified Cardiac risk factors: Cardiac Risk Factors include: advanced age (>557m, >6>64omen);dyslipidemia;hypertension;smoking/ tobacco exposure Depression/mood screen:      02/07/2022   11:53 AM  Depression screen PHQ 2/9  Decreased Interest 0  Down, Depressed, Hopeless 0  PHQ - 2 Score 0    ADLs:     02/07/2022   11:46 AM 06/13/2021   11:58 PM  In your present state of health, do you have any  difficulty performing the following activities:  Hearing? 0 0  Vision? 0 0  Difficulty concentrating or making decisions? 0 0  Walking or climbing stairs? 0 0  Dressing or bathing? 0 0  Doing errands, shopping? 0 0     Cognitive Testing  Alert? Yes  Normal Appearance?Yes  Oriented to person? Yes  Place? Yes   Time? Yes  Recall of three objects?  Yes  Can perform simple calculations? Yes  Displays appropriate judgment?Yes  Can read the correct time from a watch face?Yes  EOL planning: Does Patient Have a Medical Advance Directive?: Yes Type of Advance Directive: Healthcare Power of Attorney, Living will Does patient want to make changes to medical advance directive?: No - Patient declined Copy of [place] Chart?: No - copy requested    Review of Systems  Constitutional: Negative.  Negative for malaise/fatigue and weight loss.  HENT:  Positive for congestion (since this AM). Negative for ear pain, hearing loss, sore throat and tinnitus.   Eyes: Negative.  Negative for blurred vision and double vision.  Respiratory: Negative.  Negative for  cough, sputum production, shortness of breath and wheezing.   Cardiovascular: Negative.  Negative for chest pain, palpitations, orthopnea, claudication, leg swelling and PND.  Gastrointestinal: Negative.  Negative for abdominal pain, blood in stool, constipation, diarrhea, heartburn, melena, nausea and vomiting.  Genitourinary: Negative.   Musculoskeletal:  Negative for falls, joint pain and myalgias.  Skin: Negative.  Negative for rash.  Neurological: Negative.  Negative for dizziness, tingling, sensory change, weakness and headaches.  Endo/Heme/Allergies: Negative.  Negative for environmental allergies and polydipsia.  Psychiatric/Behavioral: Negative.  Negative for depression, memory loss, substance abuse and suicidal ideas. The patient is not nervous/anxious and does not have insomnia.   All other systems reviewed and  are negative.    Objective:   Today's Vitals   02/07/22 1127 02/07/22 1159  BP: (!) 156/82 128/74  Pulse: 71   Temp: 97.7 F (36.5 C)   SpO2: 98%   Weight: 149 lb 12.8 oz (67.9 kg)     General appearance: alert, no distress, WD/WN,  female HEENT: normocephalic, sclerae anicteric, TMs pearly, nares patent, no discharge or erythema, pharynx normal. Nasal vocal quality.  Oral cavity: MMM, no lesions Neck: supple, no lymphadenopathy, no thyromegaly, no masses Heart: RRR, normal S1, S2, no murmur Lungs: CTA bilaterally, no wheezes, rhonchi, or rales Abdomen: +bs, soft, non tender, non distended, no masses, no hepatomegaly, no splenomegaly Musculoskeletal: nontender, no swelling, no obvious deformity; R shoulder in sling Extremities: no edema, no cyanosis, no clubbing Pulses: 2+ symmetric, upper and lower extremities, normal cap refill Neurological: alert, oriented x 3, CN2-12 intact, strength normal upper extremities and lower extremities, sensation normal throughout, DTRs 2+ throughout, no cerebellar signs, gait normal Psychiatric: normal affect, behavior normal, pleasant    Medicare Attestation I have personally reviewed: The patient's medical and social history Their use of alcohol, tobacco or illicit drugs Their current medications and supplements The patient's functional ability including ADLs,fall risks, home safety risks, cognitive, and hearing and visual impairment Diet and physical activities Evidence for depression or mood disorders  The patient's weight, height, BMI, and visual acuity have been recorded in the chart.  I have made referrals, counseling, and provided education to the patient based on review of the above and I have provided the patient with a written personalized care plan for preventive services.     Izora Ribas, NP   02/07/2022

## 2022-02-07 NOTE — Patient Instructions (Addendum)
  [name] , Thank you for taking time to come for your Medicare Wellness Visit. I appreciate your ongoing commitment to your health goals. Please review the following plan we discussed and let me know if I can assist you in the future.   These are the goals we discussed:  Goals      Exercise 150 min/wk Moderate Activity     LDL CALC < 100        This is a list of the screening recommended for you and due dates:  Health Maintenance  Topic Date Due   COVID-19 Vaccine (3 - Moderna risk series) 02/23/2022*   Zoster (Shingles) Vaccine (1 of 2) 05/10/2022*   Flu Shot  03/12/2022   Tetanus Vaccine  04/07/2022   Mammogram  07/10/2022   Colon Cancer Screening  12/16/2027   DEXA scan (bone density measurement)  Completed   Hepatitis C Screening: USPSTF Recommendation to screen - Ages 36-79 yo.  Completed   HPV Vaccine  Aged Out   Pneumonia Vaccine  Discontinued  *Topic was postponed. The date shown is not the original due date.      HOW TO SCHEDULE A MAMMOGRAM AND BONE DENSITY EXAM  [facility]  7 a.m.-6:30 p.m., Monday 7 a.m.-5 p.m., Tuesday-Friday Schedule an appointment by calling [phone].

## 2022-02-08 LAB — CBC WITH DIFFERENTIAL/PLATELET
Absolute Monocytes: 408 cells/uL (ref 200–950)
Basophils Absolute: 43 cells/uL (ref 0–200)
Basophils Relative: 0.5 %
Eosinophils Absolute: 187 cells/uL (ref 15–500)
Eosinophils Relative: 2.2 %
HCT: 38.6 % (ref 35.0–45.0)
Hemoglobin: 12.9 g/dL (ref 11.7–15.5)
Lymphs Abs: 2253 cells/uL (ref 850–3900)
MCH: 30.1 pg (ref 27.0–33.0)
MCHC: 33.4 g/dL (ref 32.0–36.0)
MCV: 90.2 fL (ref 80.0–100.0)
MPV: 9.7 fL (ref 7.5–12.5)
Monocytes Relative: 4.8 %
Neutro Abs: 5610 cells/uL (ref 1500–7800)
Neutrophils Relative %: 66 %
Platelets: 257 10*3/uL (ref 140–400)
RBC: 4.28 10*6/uL (ref 3.80–5.10)
RDW: 12.5 % (ref 11.0–15.0)
Total Lymphocyte: 26.5 %
WBC: 8.5 10*3/uL (ref 3.8–10.8)

## 2022-02-08 LAB — COMPLETE METABOLIC PANEL WITH GFR
AG Ratio: 2 (calc) (ref 1.0–2.5)
ALT: 26 U/L (ref 6–29)
AST: 21 U/L (ref 10–35)
Albumin: 4.6 g/dL (ref 3.6–5.1)
Alkaline phosphatase (APISO): 59 U/L (ref 37–153)
BUN: 11 mg/dL (ref 7–25)
CO2: 30 mmol/L (ref 20–32)
Calcium: 9.6 mg/dL (ref 8.6–10.4)
Chloride: 105 mmol/L (ref 98–110)
Creat: 0.81 mg/dL (ref 0.60–1.00)
Globulin: 2.3 g/dL (calc) (ref 1.9–3.7)
Glucose, Bld: 87 mg/dL (ref 65–99)
Potassium: 4.5 mmol/L (ref 3.5–5.3)
Sodium: 142 mmol/L (ref 135–146)
Total Bilirubin: 0.6 mg/dL (ref 0.2–1.2)
Total Protein: 6.9 g/dL (ref 6.1–8.1)
eGFR: 77 mL/min/{1.73_m2} (ref >=60)

## 2022-02-08 LAB — LIPID PANEL
Cholesterol: 182 mg/dL (ref <200)
HDL: 49 mg/dL — ABNORMAL LOW (ref >=50)
LDL Cholesterol (Calc): 104 mg/dL (calc) — ABNORMAL HIGH
Non-HDL Cholesterol (Calc): 133 mg/dL (calc) — ABNORMAL HIGH (ref <130)
Total CHOL/HDL Ratio: 3.7 (calc) (ref <5.0)
Triglycerides: 170 mg/dL — ABNORMAL HIGH (ref <150)

## 2022-02-08 LAB — HEMOGLOBIN A1C
Hgb A1c MFr Bld: 5.8 % of total Hgb — ABNORMAL HIGH (ref <5.7)
Mean Plasma Glucose: 120 mg/dL
eAG (mmol/L): 6.6 mmol/L

## 2022-02-08 LAB — MAGNESIUM: Magnesium: 2.1 mg/dL (ref 1.5–2.5)

## 2022-02-08 LAB — TSH: TSH: 3.08 mIU/L (ref 0.40–4.50)

## 2022-02-10 ENCOUNTER — Other Ambulatory Visit: Payer: Self-pay | Admitting: Nurse Practitioner

## 2022-03-06 ENCOUNTER — Other Ambulatory Visit: Payer: Self-pay | Admitting: Internal Medicine

## 2022-03-06 ENCOUNTER — Other Ambulatory Visit: Payer: Self-pay | Admitting: Adult Health

## 2022-03-06 DIAGNOSIS — M8580 Other specified disorders of bone density and structure, unspecified site: Secondary | ICD-10-CM

## 2022-03-06 DIAGNOSIS — Z1231 Encounter for screening mammogram for malignant neoplasm of breast: Secondary | ICD-10-CM

## 2022-03-08 ENCOUNTER — Ambulatory Visit
Admission: RE | Admit: 2022-03-08 | Discharge: 2022-03-08 | Disposition: A | Discharge status: Home / Self Care | Payer: Medicare Other | Source: Ambulatory Visit | Attending: Adult Health | Admitting: Adult Health

## 2022-03-08 DIAGNOSIS — I2510 Atherosclerotic heart disease of native coronary artery without angina pectoris: Secondary | ICD-10-CM | POA: Diagnosis not present

## 2022-03-08 DIAGNOSIS — J432 Centrilobular emphysema: Secondary | ICD-10-CM | POA: Diagnosis not present

## 2022-03-08 DIAGNOSIS — Z87891 Personal history of nicotine dependence: Secondary | ICD-10-CM | POA: Diagnosis not present

## 2022-03-08 DIAGNOSIS — J929 Pleural plaque without asbestos: Secondary | ICD-10-CM | POA: Diagnosis not present

## 2022-04-27 ENCOUNTER — Other Ambulatory Visit: Payer: Self-pay | Admitting: Nurse Practitioner

## 2022-06-13 ENCOUNTER — Encounter: Payer: Self-pay | Admitting: Internal Medicine

## 2022-06-13 NOTE — Patient Instructions (Signed)

## 2022-06-13 NOTE — Progress Notes (Signed)
Annual Screening/Preventative Visit & Comprehensive Evaluation &  Examination   Future Appointments  Date Time Provider Department  06/14/2022                              cpe 11:00 AM Unk Pinto, MD [facility]  06/20/2023                              cpe 11:00 AM Unk Pinto, MD [facility]        This very nice 71 y.o. MWF with  HTN, HLD, Prediabetes  and Vitamin D Deficiency  presents for a Screening /Preventative Visit & comprehensive evaluation and management of multiple medical co-morbidities.   CT scan in July 2022  showed Aortic Atherosclerosis.  Patient has GERD Controlled on her meds. She also has IBS.  Patient also has hx/o Major Depression in Remission.           Patient is followed expectantly for Labile HTN.   Patient's BP has been controlled at home and patient denies any cardiac symptoms as chest pain, palpitations, shortness of breath, dizziness or ankle swelling. Today's BP is at goal -  136/86 .         Patient's hyperlipidemia is near controlled with diet and Atorvastatin. Patient denies myalgias or other medication SE's. Last lipids were near goal except elevated Trig's :  Lab Results  Component Value Date   CHOL 182 02/07/2022   HDL 49 (L) 02/07/2022   LDLCALC 104 (H) 02/07/2022   TRIG 170 (H) 02/07/2022   CHOLHDL 3.7 02/07/2022         Patient has hx/o prediabetes (A1c 5.7% /2016) and patient denies reactive hypoglycemic symptoms, visual blurring, diabetic polys or paresthesias. Last A1c was near goal :  Lab Results  Component Value Date   HGBA1C 5.8 (H) 02/07/2022         Finally, patient has history of Vitamin D Deficiency ("41" /2019) and last Vitamin D was at goal :  Lab Results  Component Value Date   VD25OH 88 06/14/2021       Current Outpatient Medications  Medication Instructions   aspirin EC  81 mg Daily   atorvastatin  80 MG tablet TAKE 1 TABLET  DAILY    buPROPion XL 300 MG  Take 1 tablet every Morning    cetirizine    10 mg  Daily   VITAMIN D 5,000 u Take 1 capsule Daily   Cyanocobalamin  1,000 mcg Daily   diclofenac  1 % GEL Apply  2 grams   2 x /day  1 to Painful Joint   famotidine  40 MG tablet TAKE 1 TABLET   DAILY    gabapentin  100 MG capsule TAKE 2 CAPSULES  3  TIMES DAILY AS NEEDED    MULTIVIT w/MINERALS 1 tablet   PROBIOTIC  Daily     Allergies  Allergen Reactions   Doxycycline    Erythromycin    Flagyl [Metronidazole]    Penicillins    Sulfa Antibiotics      Past Medical History:  Diagnosis Date   Anxiety    Depression    Diverticulitis    Ischemic colitis ([place])    Mixed hyperlipidemia 10/05/2013   Rosacea      Health Maintenance  Topic Date Due   Zoster Vaccines- Shingrix (1 of 2) Never done   Pneumonia Vaccine  77+ Years old  10/04/2018   COVID-19 Vaccine (3 - Moderna risk series) 06/26/2020   INFLUENZA VACCINE  03/12/2021   MAMMOGRAM  07/10/2021   TETANUS/TDAP  04/07/2022   COLONOSCOPY  12/16/2027   DEXA SCAN  Completed   Hepatitis C Screening  Completed   HPV VACCINES  Aged Out     Immunization History  Administered Date(s) Administered   Influenza Split 05/17/2013, 05/17/2014   Influenza, High Dose  05/30/2017, 06/01/2018, 08/02/2019   Influenza 06/03/2018   Moderna Sars-Covid-2 Vacc 05/01/2020, 05/29/2020   PPD Test 05/04/2014   Pneumococcal  -  13 05/09/2015   Pneumococcal  -  23 10/04/2013   Td 08/30/2002   Tdap 04/07/2012   Varicella  Immune Globulin 03/15/2016    Last Colon - 12/15/2017 - Dr Earlean Shawl - Mod /Severe Diverticulosis- Recc 10 yr f/u due May 2029  Last MGM - 07/10/2020   Last BMD - 03/24/2019 - Osteopenia Lt Femur  T =   -1.5   Past Surgical History:  Procedure Laterality Date   ABDOMINAL HYSTERECTOMY     APPENDECTOMY     CARPAL TUNNEL RELEASE Right 2004   LUMBAR LAMINECTOMY  2000   MICRODISCECTOMY LUMBAR     SHOULDER ARTHROSCOPY WITH ROTATOR CUFF REPAIR Right 12/21/2020   Dr. Durward Fortes   TUBAL LIGATION       Family  History  Problem Relation Age of Onset   Diabetes Father    Alcohol abuse Father    Cancer Father        prostate, lung   Hypertension Mother    Stroke Mother    Heart disease Mother    Diabetes Sister      Social History   Tobacco Use   Smoking status: Former    Packs/day: 1.00    Years: 30.00    Pack years: 30.00    Types: Cigarettes    Quit date: 10/04/2008    Years since quitting: 12.6   Smokeless tobacco: Never  Vaping Use   Vaping Use: Never used  Substance Use Topics   Alcohol use: No   Drug use: No      ROS Constitutional: Denies fever, chills, weight loss/gain, headaches, insomnia,  night sweats, and change in appetite. Does c/o fatigue. Eyes: Denies redness, blurred vision, diplopia, discharge, itchy, watery eyes.  ENT: Denies discharge, congestion, post nasal drip, epistaxis, sore throat, earache, hearing loss, dental pain, Tinnitus, Vertigo, Sinus pain, snoring.  Cardio: Denies chest pain, palpitations, irregular heartbeat, syncope, dyspnea, diaphoresis, orthopnea, PND, claudication, edema Respiratory: denies cough, dyspnea, DOE, pleurisy, hoarseness, laryngitis, wheezing.  Gastrointestinal: Denies dysphagia, heartburn, reflux, water brash, pain, cramps, nausea, vomiting, bloating, diarrhea, constipation, hematemesis, melena, hematochezia, jaundice, hemorrhoids Genitourinary: Denies dysuria, frequency, urgency, nocturia, hesitancy, discharge, hematuria, flank pain Breast: Breast lumps, nipple discharge, bleeding.  Musculoskeletal: Denies arthralgia, myalgia, stiffness, Jt. Swelling, pain, limp, and strain/sprain. Denies falls. Skin: Denies puritis, rash, hives, warts, acne, eczema, changing in skin lesion Neuro: No weakness, tremor, incoordination, spasms, paresthesia, pain Psychiatric: Denies confusion, memory loss, sensory loss. Denies Depression. Endocrine: Denies change in weight, skin, hair change, nocturia, and paresthesia, diabetic polys, visual  blurring, hyper / hypo glycemic episodes.  Heme/Lymph: No excessive bleeding, bruising, enlarged lymph nodes.  Physical Exam  BP 136/86   Pulse 73   Temp 97.7 F (36.5 C)   Resp 16   Ht '5\' 3"'$  (1.6 m)   Wt 149 lb 3.2 oz (67.7 kg)   SpO2 96%   BMI 26.43 kg/m  General Appearance: Well nourished, well groomed and in no apparent distress.  Eyes: PERRLA, EOMs, conjunctiva no swelling or erythema, normal fundi and vessels. Sinuses: No frontal/maxillary tenderness ENT/Mouth: EACs patent / TMs  nl. Nares clear without erythema, swelling, mucoid exudates. Oral hygiene is good. No erythema, swelling, or exudate. Tongue normal, non-obstructing. Tonsils not swollen or erythematous. Hearing normal.  Neck: Supple, thyroid not palpable. No bruits, nodes or JVD. Respiratory: Respiratory effort normal.  BS equal and clear bilateral without rales, rhonci, wheezing or stridor. Cardio: Heart sounds are normal with regular rate and rhythm and no murmurs, rubs or gallops. Peripheral pulses are normal and equal bilaterally without edema. No aortic or femoral bruits. Chest: symmetric with normal excursions and percussion. Breasts: Deferred to upcoming MGM. Abdomen: Flat, soft with bowel sounds active. Nontender, no guarding, rebound, hernias, masses, or organomegaly.  Lymphatics: Non tender without lymphadenopathy.  Musculoskeletal: Full ROM all peripheral extremities, joint stability, 5/5 strength, and normal gait. Skin: Warm and dry without rashes, lesions, cyanosis, clubbing or  ecchymosis.  Neuro: Cranial nerves intact, reflexes equal bilaterally. Normal muscle tone, no cerebellar symptoms. Sensation intact.  Pysch: Alert and oriented X 3, normal affect, Insight and Judgment appropriate.    Assessment and Plan  1. Annual Preventative Screening Examination   2. Labile hypertension  - EKG 12-Lead - Urinalysis, Routine w reflex microscopic - Microalbumin / creatinine urine ratio - CBC with  Differential/Platelet - COMPLETE METABOLIC PANEL WITH GFR - Magnesium - TSH  3. Hyperlipidemia, mixed  - EKG 12-Lead - Lipid panel  4. Abnormal glucose  - EKG 12-Lead - Hemoglobin A1c - Insulin, random  5. Vitamin D deficiency  - VITAMIN D 25 Hydroxy   6. Recurrent major depressive disorder, in full remission ([place])  - TSH  7. Aortic atherosclerosis ([place]) by Chest CT scan 01/07/2020  - EKG 12-Lead  8. Screening for colorectal cancer  - POC Hemoccult Bld/Stl   9. Screening for heart disease  - EKG 12-Lead  10. FHx: heart disease  - EKG 12-Lead  11. Former smoker (30 pack/year, quit 2011)  - EKG 12-Lead  12. Medication management  - Urinalysis, Routine w reflex microscopic - Microalbumin / creatinine urine ratio - CBC with Differential/Platelet - COMPLETE METABOLIC PANEL WITH GFR - Magnesium - Lipid panel - TSH - Hemoglobin A1c - Insulin, random - VITAMIN D 25 Hydroxy          Patient was counseled in prudent diet to achieve/maintain BMI less than 25 for weight control, BP monitoring, regular exercise and medications. Discussed med's effects and SE's. Screening labs and tests as requested with regular follow-up as recommended. Over 40 minutes of exam, counseling, chart review and high complex critical decision making was performed.   Kirtland Bouchard, MD.

## 2022-06-14 ENCOUNTER — Ambulatory Visit (INDEPENDENT_AMBULATORY_CARE_PROVIDER_SITE_OTHER): Payer: Medicare Other | Admitting: Internal Medicine

## 2022-06-14 ENCOUNTER — Encounter: Payer: Self-pay | Admitting: Internal Medicine

## 2022-06-14 VITALS — BP 136/86 | HR 73 | Temp 97.7°F | Resp 16 | Ht 63.0 in | Wt 149.2 lb

## 2022-06-14 DIAGNOSIS — Z0000 Encounter for general adult medical examination without abnormal findings: Secondary | ICD-10-CM | POA: Diagnosis not present

## 2022-06-14 DIAGNOSIS — G8929 Other chronic pain: Secondary | ICD-10-CM

## 2022-06-14 DIAGNOSIS — Z0001 Encounter for general adult medical examination with abnormal findings: Secondary | ICD-10-CM

## 2022-06-14 DIAGNOSIS — Z79899 Other long term (current) drug therapy: Secondary | ICD-10-CM | POA: Diagnosis not present

## 2022-06-14 DIAGNOSIS — E782 Mixed hyperlipidemia: Secondary | ICD-10-CM

## 2022-06-14 DIAGNOSIS — R7309 Other abnormal glucose: Secondary | ICD-10-CM

## 2022-06-14 DIAGNOSIS — E559 Vitamin D deficiency, unspecified: Secondary | ICD-10-CM

## 2022-06-14 DIAGNOSIS — Z136 Encounter for screening for cardiovascular disorders: Secondary | ICD-10-CM

## 2022-06-14 DIAGNOSIS — R0989 Other specified symptoms and signs involving the circulatory and respiratory systems: Secondary | ICD-10-CM | POA: Diagnosis not present

## 2022-06-14 DIAGNOSIS — F3342 Major depressive disorder, recurrent, in full remission: Secondary | ICD-10-CM

## 2022-06-14 DIAGNOSIS — Z87891 Personal history of nicotine dependence: Secondary | ICD-10-CM

## 2022-06-14 DIAGNOSIS — Z8249 Family history of ischemic heart disease and other diseases of the circulatory system: Secondary | ICD-10-CM

## 2022-06-14 DIAGNOSIS — I10 Essential (primary) hypertension: Secondary | ICD-10-CM

## 2022-06-14 DIAGNOSIS — I700 Atherosclerosis of aorta: Secondary | ICD-10-CM

## 2022-06-14 DIAGNOSIS — Z1211 Encounter for screening for malignant neoplasm of colon: Secondary | ICD-10-CM

## 2022-06-14 MED ORDER — GABAPENTIN 300 MG PO CAPS: ORAL_CAPSULE | ORAL | 3 refills | Status: DC

## 2022-06-15 NOTE — Progress Notes (Signed)
<><><><><><><><><><><><><><><><><><><><><><><><><><><><><><><><><> <><><><><><><><><><><><><><><><><><><><><><><><><><><><><><><><><> - Test results slightly outside the reference range are not unusual. If there is anything important, I will review this with you,  otherwise it is considered normal test values.  If you have further questions,  please do not hesitate to contact me at the office or via My Chart.  <><><><><><><><><><><><><><><><><><><><><><><><><><><><><><><><><> <><><><><><><><><><><><><><><><><><><><><><><><><><><><><><><><><>  -   -  Total  Chol =  204  -  Borderline Elevated             (  Ideal  or  Goal is less than 180  !  )  & -  Bad / Dangerous LDL  Chol =  116   - very  Elevated              (  Ideal  or  Goal is less than 70  !  )   - Recommend a STRICTER low cholesterol diet   - Cholesterol only comes from animal sources                                                                               - ie. meat, dairy, egg yolks  - Eat all the vegetables you want.  - Avoid Meat, Avoid Meat,  Avoid Meat                                                               - especially Red Meat - Beef AND Pork   - Avoid cheese & dairy - milk & ice cream.     - Cheese is the most concentrated form of trans-fats which                                                              is the worst thing to clog up our arteries.    - Veggie cheese is OK which can be found in the fresh produce section                                                        at [employer] or [employer] or [employer] <><><><><><><><><><><><><><><><><><><><><><><><><><><><><><><><><> <><><><><><><><><><><><><><><><><><><><><><><><><><><><><><><><><>  -  Also, Triglycerides (   218   ) or fats in blood are too high                 (   Ideal or  Goal is less than 150  !  )    - Recommend avoid fried & greasy foods,  sweets / candy,   - Avoid white rice  (brown or wild rice or Quinoa is OK),    - Avoid white potatoes  (  sweet potatoes are OK)   - Avoid anything made from white flour                         -  bagels, doughnuts, rolls, buns, biscuits, white and                          -  wheat breads, pizza crust and traditional                                                 pasta made of white flour & egg white  - (vegetarian pasta or spinach or wheat pasta is OK).    - Multi-grain bread is OK - like multi-grain flat bread or  sandwich thins.   - Avoid alcohol in excess.   - Exercise is also important. <><><><><><><><><><><><><><><><><><><><><><><><><><><><><><><><><>  -  A1c = 6.1 % -  Blood sugar and A1c are STILL elevated  &  even higher in the borderline and early or pre-diabetes range which   has the same 300% increased risk for heart attack, stroke, cancer and                                      alzheimer- type vascular dementia as full blown diabetes.   But the good news is that diet, exercise with weight loss can                                                                         cure the early diabetes at this point. <><><><><><><><><><><><><><><><><><><><><><><><><><><><><><><><><>  -  It is very important that you work harder with diet by                                avoiding all foods that are white except chicken, fish & calliflower.  - Avoid white rice  (brown & wild rice is OK),   - Avoid white potatoes  (sweet potatoes in moderation is OK),   White bread or wheat bread or anything made out of   white flour like bagels, donuts, rolls, buns, biscuits, cakes,  - pastries, cookies, pizza crust, and pasta (made from white flour & egg whites)   - vegetarian pasta or spinach or wheat pasta is OK.  - Multigrain breads like Arnold's, Pepper[employer] or   multigrain sandwich thins or high fiber breads like                                     E[place]ka bread or "Dave's Killer" breads that are  4 to 5 grams fiber per slice !  are best.     - Diet, exercise and weight loss can reverse and cure diabetes in the early stages.   <><><><><><><><><><><><><><><><><><><><><><><><><><><><><><><><><> <><><><><><><><><><><><><><><><><><><><><><><><><><><><><><><><><>  -  Vitamin D = 77 - Excellent  - Please keep dose same  <><><><><><><><><><><><><><><><><><><><><><><><><><><><><><><><><>  -  All Else - CBC - Kidneys - Electrolytes - Liver - Magnesium & Thyroid    - all  Normal / OK <><><><><><><><><><><><><><><><><><><><><><><><><><><><><><><><><> <><><><><><><><><><><><><><><><><><><><><><><><><><><><><><><><><>

## 2022-06-17 LAB — CBC WITH DIFFERENTIAL/PLATELET
Absolute Monocytes: 299 cells/uL (ref 200–950)
Basophils Absolute: 29 cells/uL (ref 0–200)
Basophils Relative: 0.6 %
Eosinophils Absolute: 191 cells/uL (ref 15–500)
Eosinophils Relative: 3.9 %
HCT: 38 % (ref 35.0–45.0)
Hemoglobin: 12.8 g/dL (ref 11.7–15.5)
Lymphs Abs: 1955 cells/uL (ref 850–3900)
MCH: 30.3 pg (ref 27.0–33.0)
MCHC: 33.7 g/dL (ref 32.0–36.0)
MCV: 89.8 fL (ref 80.0–100.0)
MPV: 9.4 fL (ref 7.5–12.5)
Monocytes Relative: 6.1 %
Neutro Abs: 2426 cells/uL (ref 1500–7800)
Neutrophils Relative %: 49.5 %
Platelets: 284 10*3/uL (ref 140–400)
RBC: 4.23 10*6/uL (ref 3.80–5.10)
RDW: 12.4 % (ref 11.0–15.0)
Total Lymphocyte: 39.9 %
WBC: 4.9 10*3/uL (ref 3.8–10.8)

## 2022-06-17 LAB — COMPLETE METABOLIC PANEL WITH GFR
AG Ratio: 2 (calc) (ref 1.0–2.5)
ALT: 26 U/L (ref 6–29)
AST: 21 U/L (ref 10–35)
Albumin: 4.8 g/dL (ref 3.6–5.1)
Alkaline phosphatase (APISO): 64 U/L (ref 37–153)
BUN: 11 mg/dL (ref 7–25)
CO2: 30 mmol/L (ref 20–32)
Calcium: 10.1 mg/dL (ref 8.6–10.4)
Chloride: 102 mmol/L (ref 98–110)
Creat: 0.89 mg/dL (ref 0.60–1.00)
Globulin: 2.4 g/dL (calc) (ref 1.9–3.7)
Glucose, Bld: 100 mg/dL — ABNORMAL HIGH (ref 65–99)
Potassium: 4.6 mmol/L (ref 3.5–5.3)
Sodium: 141 mmol/L (ref 135–146)
Total Bilirubin: 0.5 mg/dL (ref 0.2–1.2)
Total Protein: 7.2 g/dL (ref 6.1–8.1)
eGFR: 69 mL/min/{1.73_m2} (ref >=60)

## 2022-06-17 LAB — URINALYSIS, ROUTINE W REFLEX MICROSCOPIC
Bacteria, UA: NONE SEEN /HPF
Bilirubin Urine: NEGATIVE
Glucose, UA: NEGATIVE
Hgb urine dipstick: NEGATIVE
Hyaline Cast: NONE SEEN /LPF
Ketones, ur: NEGATIVE
Nitrite: NEGATIVE
Protein, ur: NEGATIVE
RBC / HPF: NONE SEEN /HPF (ref 0–2)
Specific Gravity, Urine: 1.006 (ref 1.001–1.035)
Squamous Epithelial / HPF: NONE SEEN /HPF (ref <=5)
WBC, UA: NONE SEEN /HPF (ref 0–5)
pH: 6.5 (ref 5.0–8.0)

## 2022-06-17 LAB — MAGNESIUM: Magnesium: 2.1 mg/dL (ref 1.5–2.5)

## 2022-06-17 LAB — MICROALBUMIN / CREATININE URINE RATIO
Creatinine, Urine: 35 mg/dL (ref 20–275)
Microalb, Ur: <0.2 mg/dL

## 2022-06-17 LAB — INSULIN, RANDOM: Insulin: 19.7 u[IU]/mL — ABNORMAL HIGH

## 2022-06-17 LAB — HEMOGLOBIN A1C
Hgb A1c MFr Bld: 6.1 % of total Hgb — ABNORMAL HIGH (ref <5.7)
Mean Plasma Glucose: 128 mg/dL
eAG (mmol/L): 7.1 mmol/L

## 2022-06-17 LAB — LIPID PANEL
Cholesterol: 204 mg/dL — ABNORMAL HIGH (ref <200)
HDL: 54 mg/dL (ref >=50)
LDL Cholesterol (Calc): 116 mg/dL (calc) — ABNORMAL HIGH
Non-HDL Cholesterol (Calc): 150 mg/dL (calc) — ABNORMAL HIGH (ref <130)
Total CHOL/HDL Ratio: 3.8 (calc) (ref <5.0)
Triglycerides: 218 mg/dL — ABNORMAL HIGH (ref <150)

## 2022-06-17 LAB — MICROSCOPIC MESSAGE

## 2022-06-17 LAB — VITAMIN D 25 HYDROXY (VIT D DEFICIENCY, FRACTURES): Vit D, 25-Hydroxy: 77 ng/mL (ref 30–100)

## 2022-06-17 LAB — TSH: TSH: 4.43 mIU/L (ref 0.40–4.50)

## 2022-07-19 ENCOUNTER — Other Ambulatory Visit: Payer: Self-pay

## 2022-07-19 MED ORDER — FAMOTIDINE 40 MG PO TABS: ORAL_TABLET | ORAL | 3 refills | Status: DC

## 2022-08-21 ENCOUNTER — Ambulatory Visit
Admission: RE | Admit: 2022-08-21 | Discharge: 2022-08-21 | Disposition: A | Discharge status: Home / Self Care | Payer: Medicare Other | Source: Ambulatory Visit | Attending: Internal Medicine | Admitting: Internal Medicine

## 2022-08-21 DIAGNOSIS — Z780 Asymptomatic menopausal state: Secondary | ICD-10-CM | POA: Diagnosis not present

## 2022-08-21 DIAGNOSIS — M85852 Other specified disorders of bone density and structure, left thigh: Secondary | ICD-10-CM | POA: Diagnosis not present

## 2022-08-21 DIAGNOSIS — Z1231 Encounter for screening mammogram for malignant neoplasm of breast: Secondary | ICD-10-CM

## 2022-08-21 DIAGNOSIS — M8580 Other specified disorders of bone density and structure, unspecified site: Secondary | ICD-10-CM

## 2022-08-21 NOTE — Progress Notes (Signed)
<><><><><><><><><><><><><><><><><><><><><><><><><><><><><><><><><> <><><><><><><><><><><><><><><><><><><><><><><><><><><><><><><><><>  -   Bone Density shows mild Osteopenia ( NOT Osteoporosis)  -   - Recommendations are         Exercise        Calcium 500 - 600 mg  supplements  /day         Vitamin D          Vitamin K2    ( MK-7)       100 mg                   - can get on [employer]  #240 capsules - an 8 month supply for $17                    <><><><><><><><><><><><><><><><><><><><><><><><><><><><><><><><><> <><><><><><><><><><><><><><><><><><><><><><><><><><><><><><><><><>  - Recommend repeat the Bone Density scan in 2 years  ( Jan 2026)

## 2022-09-26 ENCOUNTER — Ambulatory Visit (INDEPENDENT_AMBULATORY_CARE_PROVIDER_SITE_OTHER): Payer: Medicare Other | Admitting: Nurse Practitioner

## 2022-09-26 ENCOUNTER — Encounter: Payer: Self-pay | Admitting: Nurse Practitioner

## 2022-09-26 VITALS — BP 154/78 | HR 72 | Temp 97.5°F | Ht 63.0 in | Wt 147.0 lb

## 2022-09-26 DIAGNOSIS — Z0000 Encounter for general adult medical examination without abnormal findings: Secondary | ICD-10-CM

## 2022-09-26 DIAGNOSIS — J439 Emphysema, unspecified: Secondary | ICD-10-CM | POA: Diagnosis not present

## 2022-09-26 DIAGNOSIS — F3342 Major depressive disorder, recurrent, in full remission: Secondary | ICD-10-CM | POA: Diagnosis not present

## 2022-09-26 DIAGNOSIS — E559 Vitamin D deficiency, unspecified: Secondary | ICD-10-CM | POA: Diagnosis not present

## 2022-09-26 DIAGNOSIS — R6889 Other general symptoms and signs: Secondary | ICD-10-CM

## 2022-09-26 DIAGNOSIS — L719 Rosacea, unspecified: Secondary | ICD-10-CM

## 2022-09-26 DIAGNOSIS — Z79899 Other long term (current) drug therapy: Secondary | ICD-10-CM | POA: Diagnosis not present

## 2022-09-26 DIAGNOSIS — R7309 Other abnormal glucose: Secondary | ICD-10-CM | POA: Diagnosis not present

## 2022-09-26 DIAGNOSIS — I700 Atherosclerosis of aorta: Secondary | ICD-10-CM

## 2022-09-26 DIAGNOSIS — Z87891 Personal history of nicotine dependence: Secondary | ICD-10-CM | POA: Diagnosis not present

## 2022-09-26 DIAGNOSIS — M545 Low back pain, unspecified: Secondary | ICD-10-CM

## 2022-09-26 DIAGNOSIS — G8929 Other chronic pain: Secondary | ICD-10-CM

## 2022-09-26 DIAGNOSIS — R0989 Other specified symptoms and signs involving the circulatory and respiratory systems: Secondary | ICD-10-CM | POA: Diagnosis not present

## 2022-09-26 DIAGNOSIS — K649 Unspecified hemorrhoids: Secondary | ICD-10-CM

## 2022-09-26 DIAGNOSIS — Z0001 Encounter for general adult medical examination with abnormal findings: Secondary | ICD-10-CM | POA: Diagnosis not present

## 2022-09-26 DIAGNOSIS — E782 Mixed hyperlipidemia: Secondary | ICD-10-CM

## 2022-09-26 DIAGNOSIS — F419 Anxiety disorder, unspecified: Secondary | ICD-10-CM | POA: Diagnosis not present

## 2022-09-26 DIAGNOSIS — K5901 Slow transit constipation: Secondary | ICD-10-CM

## 2022-09-26 DIAGNOSIS — Z8719 Personal history of other diseases of the digestive system: Secondary | ICD-10-CM

## 2022-09-26 MED ORDER — LINACLOTIDE 72 MCG PO CAPS: 72.0000 ug | ORAL_CAPSULE | Freq: Every day | ORAL | 0 refills | Status: DC

## 2022-09-26 NOTE — Patient Instructions (Signed)
Linaclotide Capsules What is this medication? LINACLOTIDE (lin a KLOE tide) treats irritable bowel syndrome (IBS) with constipation. It may also treat chronic constipation. It works by softening the stool, making it easier to have a bowel movement. This medicine may be used for other purposes; ask your health care provider or pharmacist if you have questions. COMMON BRAND NAME(S): Linzess What should I tell my care team before I take this medication? They need to know if you have any of these conditions: Diarrhea History of blockage in your bowels History of stool (fecal) impaction An unusual or allergic reaction to linaclotide, other medications, foods, dyes, or preservatives Pregnant or trying to get pregnant Breast-feeding How should I use this medication? Take this medication by mouth with water. Take it as directed on the prescription label at the same time every day. Do not cut, crush or chew this medication. Swallow the capsules whole. Take it on an empty stomach, at least 30 minutes before a meal. If you have trouble swallowing, you may open the capsule and mix with applesauce or water. Keep taking it unless your care team tells you to stop. How to take with applesauce: Open the capsule and put the contents in 1 teaspoon of applesauce. Swallow the medication and applesauce right away. Do not chew the medication or applesauce. Do not store for later use. How to take with water: Pour 30 mL (1 ounce) of room temperature bottled water into a clean cup. Open the capsule and sprinkle all the beads into the cup of water. Swirl the mixture for at least 20 seconds. Drink the mixture right away. If beads remain in the cup, add 30 mL more of water and swirl. Drink the mixture right away. Do not store for later use. A special MedGuide will be given to you by the pharmacist with each prescription and refill. Be sure to read this information carefully each time. Talk to your care team about the use of  this medication in children. While it may be given to children as young as 6 years for selected conditions, precautions do apply. Overdosage: If you think you have taken too much of this medicine contact a poison control center or emergency room at once. NOTE: This medicine is only for you. Do not share this medicine with others. What if I miss a dose? If you miss a dose, skip it. Take your next dose at the normal time. Do not take extra or 2 doses at the same time to make up for the missed dose. What may interact with this medication? Interactions have not been studied. This list may not describe all possible interactions. Give your health care provider a list of all the medicines, herbs, non-prescription drugs, or dietary supplements you use. Also tell them if you smoke, drink alcohol, or use illegal drugs. Some items may interact with your medicine. What should I watch for while using this medication? Visit your care team for regular checks on your progress. Tell your care team if your symptoms do not start to get better or if they get worse. Diarrhea is a common side effect of this medication. It often begins within 2 weeks of starting this medication. Stop taking this medication and call your care team if you get severe diarrhea. Stop taking this medication and call your care team or go to the nearest hospital emergency room right away if you develop unusual or severe stomach pain, especially if you also have bright red, bloody stools or black stools  that look like tar. What side effects may I notice from receiving this medication? Side effects that you should report to your care team as soon as possible: Allergic reactions--skin rash, itching, hives, swelling of the face, lips, tongue, or throat Side effects that usually do not require medical attention (report to your care team if they continue or are bothersome): Bloating Diarrhea Gas Stomach pain This list may not describe all possible  side effects. Call your doctor for medical advice about side effects. You may report side effects to FDA at 1-800-FDA-1088. Where should I keep my medication? Keep out of the reach of children and pets. Store at room temperature between 20 and 25 degrees C (68 and 77 degrees F). Keep this medication in the original container. Protect from moisture. Keep the container tightly closed. Do not throw out the packet in the container. It keeps the medication dry. Get rid of any unused medication after the expiration date. To get rid of medications that are no longer needed or have expired: Take the medication to a medication take-back program. Check with your pharmacy or law enforcement to find a location. If you cannot return the medication, check the label or package insert to see if the medication should be thrown out in the garbage or flushed down the toilet. If you are not sure, ask your care team. If it is safe to put it in the trash, take the medication out of the container. Mix the medication with cat litter, dirt, coffee grounds, or other unwanted substance. Seal the mixture in a bag or container. Put it in the trash. NOTE: This sheet is a summary. It may not cover all possible information. If you have questions about this medicine, talk to your doctor, pharmacist, or health care provider.  2023 [employer] Standard (2021-06-18 00:00:00)

## 2022-09-26 NOTE — Progress Notes (Signed)
MEDICARE WELLNESS AND FOLLOW UP  Assessment:   Encounter for Medicare annual wellness exam Due annually Health maintenance reviewed  Atherosclerosis of aorta ([place]) - Ct scan 01/07/2020/Hyperlipidemia Control blood pressure, cholesterol, glucose, increase exercise.  Currently implementing lifestyle modifications. Recommend a diet heavy in fruits and veggies, omega 3's. Decrease consumption of animal meats, cheeses, and dairy products. Remain active and exercise as tolerated. Continue to monitor. Check lipids/TSH  Recurrent major depressive disorder, in full remission ([place]) Remission with Wellbutrin Continue to monitor  Former smoker (30 pack year history, quit 2011) Chest CTA completed 02/2022 Lung-RADS 1S, negative.  Continue annual screening  Lung cancer screening with low dose CT discussed as recommended by guidelines based on age, number of pack year history.  Discussed risks of screening including but not limited to false positives on xray, further testing or consultation with specialist, and possible false negative CT as well.   Emphysema ([place]) Former smoker, per imaging, doing well with mucinex PRN  Anxiety Controlled Continue Wellbutrin.   Reviewed relaxation techniques.  Sleep hygiene. Recommended mindfulness meditation and exercise.   Encouraged personality growth wand development through coping techniques and problem-solving skills. Limit/Decrease/Monitor drug/alcohol intake.     Abnormal glucose (prediabetes) Education: Reviewed 'ABCs' of diabetes management  Discussed goals to be met and/or maintained include A1C (<7) Blood pressure (<130/80) Cholesterol (LDL <70) Continue Eye Exam yearly  Continue Dental Exam Q6 mo Discussed dietary recommendations Discussed Physical Activity recommendations Check A1C   Rosacea Follows with Derm  Hx of Ischemic colitis Controlled without recent episodes, avoid triggers, continue follow up GI as recommended  Vitamin  D deficiency Continue supplement for goal and maintenance of 60-100 Monitor levels  Medication management All medications discussed and reviewed in full. All questions and concerns regarding medications addressed.     Bilateral low back pain without sciatica Controlled Continue gabapentin, cyanocobalamin, voltaren PRN  Labile htn Recently fairly controlled by lifestyle  Discussed DASH (Dietary Approaches to Stop Hypertension) DASH diet is lower in sodium than a typical American diet. Cut back on foods that are high in saturated fat, cholesterol, and trans fats. Eat more whole-grain foods, fish, poultry, and nuts Remain active and exercise as tolerated daily.  Monitor BP at home-Call if greater than 130/80.  Check CMP/CBC  Constipation/Hemorrhoid Start Linzess 72 mg Stay well hydrated Discussed referral to GI for assessment of hemorrhoid removal if needed. Continue Probitoic  Orders Placed This Encounter  Procedures   CBC with Differential/Platelet   COMPLETE METABOLIC PANEL WITH GFR   Lipid panel   Hemoglobin A1c   VITAMIN D 25 Hydroxy (Vit-D Deficiency, Fractures)   Meds ordered this encounter  Medications   linaclotide (LINZESS) 72 MCG capsule    Sig: Take 1 capsule (72 mcg total) by mouth daily before breakfast.    Dispense:  30 capsule    Refill:  0    Order Specific Question:   Supervising Provider    Answer:   Unk Pinto [phone]   Notify office for further evaluation and treatment, questions or concerns if any reported s/s fail to improve.   The patient was advised to call back or seek an in-person evaluation if any symptoms worsen or if the condition fails to improve as anticipated.   Further disposition pending results of labs. Discussed med's effects and SE's.    I discussed the assessment and treatment plan with the patient. The patient was provided an opportunity to ask questions and all were answered. The patient agreed with the plan  and  demonstrated an understanding of the instructions.  Discussed med's effects and SE's. Screening labs and tests as requested with regular follow-up as recommended.  I provided 35 minutes of face-to-face time during this encounter including counseling, chart review, and critical decision making was preformed.    Future Appointments  Date Time Provider [place]  12/30/2022 10:30 AM Unk Pinto, MD [facility] None  06/20/2023 11:00 AM Unk Pinto, MD [facility] None  12/30/2023 11:00 AM Darrol Jump, NP [facility] None      Plan:   During the course of the visit the patient was educated and counseled about appropriate screening and preventive services including:   Pneumococcal vaccine  Prevnar 13 Influenza vaccine Td vaccine Screening electrocardiogram Bone densitometry screening Colorectal cancer screening Diabetes screening Glaucoma screening Nutrition counseling  Advanced directives: requested   Future Appointments  Date Time Provider [place]  12/30/2022 10:30 AM Unk Pinto, MD [facility] None  06/20/2023 11:00 AM Unk Pinto, MD [facility] None  12/30/2023 11:00 AM Darrol Jump, NP [facility] None      Subjective:   Kathryn Lamb is a 72 y.o. female who presents for wellness and 3 month follow up on hypertension, prediabetes, hyperlipidemia, vitamin D def.   Overall reports feeling well today.  Reports constipation, with painful external hemorrhoid, hast to take daily stool softeners with Benefiber without relief.  Continues to have Type I [place] Stools, BM every 3-4 days.    Mother passed after hospice in 201, she is on wellbutrin 300 mg and feels she is doing well.   She is a former smoker, quit in 2010 with 30 pack/year smoking history. She had low dose lung cancer screening CT 12/2019 which showed emphysematous changes.  Most recent 02/2022 negative with annual re-screen.  She has R shoulder arthroscopic rotator cuff  repair by Dr. Garnette Czech 12/21/2020, doing well, PT twice a week, has follow up in a few weeks. Currently in a sling.  BMI is Body mass index is 26.04 kg/m., she has been working on diet and exercise, Wt Readings from Last 3 Encounters:  09/26/22 147 lb (66.7 kg)  06/14/22 149 lb 3.2 oz (67.7 kg)  02/07/22 149 lb 12.8 oz (67.9 kg)   Her blood pressure has been controlled at home, today their BP is BP: (!) 154/78 She does workout. She denies chest pain, shortness of breath, dizziness. Not currently on BP  medications.  Controlled thorough lifestyle.   She has aortic atherosclerosis and CAD per CT 12/2019.  Currently on Atorvastatin, she increased to 80 mg last visit, and denies myalgias. Her cholesterol is at goal. The cholesterol last visit was:   Lab Results  Component Value Date   CHOL 204 (H) 06/14/2022   HDL 54 06/14/2022   LDLCALC 116 (H) 06/14/2022   TRIG 218 (H) 06/14/2022   CHOLHDL 3.8 06/14/2022   She has been working on diet and exercise for prediabetes, and denies paresthesia of the feet, polydipsia, polyuria and visual disturbances. Last A1C in the office was:  Lab Results  Component Value Date   HGBA1C 6.1 (H) 06/14/2022   Patient is on Vitamin D supplement. Lab Results  Component Value Date   VD25OH 77 06/14/2022       Medication Review   Current Outpatient Medications (Cardiovascular):    atorvastatin (LIPITOR) 80 MG tablet, TAKE 1 TABLET BY MOUTH  DAILY FOR CHOLESTEROL  Current Outpatient Medications (Respiratory):    cetirizine (ZYRTEC) 10 MG tablet, Take 10 mg by mouth daily.  Current  Outpatient Medications (Analgesics):    aspirin EC 81 MG tablet, Take 81 mg by mouth daily. Swallow whole.  Current Outpatient Medications (Hematological):    cyanocobalamin 1000 MCG tablet, Take 1,000 mcg by mouth daily.  Current Outpatient Medications (Other):    buPROPion (WELLBUTRIN XL) 300 MG 24 hr tablet, Take 1 tablet every Morning for Mood,  Focus & Concentration                                                   /              TAKE                                         BY                                             MOUTH   Cholecalciferol (VITAMIN D) 125 MCG (5000 UT) CAPS, Take 1 capsule Daily   famotidine (PEPCID) 40 MG tablet, TAKE 1 TABLET BY MOUTH  DAILY FOR ACID REFLUX AND  INDIGESTION   gabapentin (NEURONTIN) 300 MG capsule, Take 1 capsule 3 x /day for Pain   Multiple Vitamin (MULTIVITAMIN WITH MINERALS) TABS tablet, Take 1 tablet by mouth. Takes 3 times per week. M,W,F   Probiotic Product (PROBIOTIC DAILY PO), Take by mouth daily.   VITAMIN K PO, Take by mouth daily.   diclofenac Sodium (VOLTAREN) 1 % GEL, Apply  2 grams   2 x /day  1 to Painful Joint (Patient not taking: Reported on 09/26/2022)   linaclotide (LINZESS) 72 MCG capsule, Take 1 capsule (72 mcg total) by mouth daily before breakfast.  Current Problems (verified) Patient Active Problem List   Diagnosis Date Noted   Former smoker (30 pack/year, quit 2011) 02/07/2021   Labile hypertension 05/28/2020   Osteoarthritis of right AC (acromioclavicular) joint 01/18/2020   Os acromiale of right shoulder 01/18/2020   Carpal tunnel syndrome, left upper limb 01/18/2020   Aortic atherosclerosis ([place]) by Chest CT scan 01/07/2020 01/11/2020   Emphysema lung ([place]) 01/11/2020   Vitamin D deficiency 11/16/2015   Medication management 11/16/2015   Abnormal glucose 08/22/2014   Hyperlipidemia, mixed 10/05/2013   Rosacea    Anxiety    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[place])    History of ischemic colitis     Screening Tests Immunization History  Administered Date(s) Administered   Influenza Split 05/17/2013, 05/17/2014   Influenza, High Dose Seasonal PF 05/09/2015, 06/18/2016, 05/30/2017, 06/01/2018, 08/02/2019   Influenza-Unspecified 06/03/2018   Moderna Sars-Covid-2 Vaccination 05/01/2020, 05/29/2020   PPD Test 05/04/2014   Pneumococcal Conjugate-13 05/09/2015   Pneumococcal  Polysaccharide-23 10/04/2013   Td 08/30/2002   Tdap 04/07/2012   Varicella Zoster Immune Globulin 03/15/2016   Preventative care: Last colonoscopy: 12/2017- never had polyps, GI states for her to not have another- very tortuous she needed peds scope and had a very difficult exam- will discuss other options in 10 years.   Last mammogram: 08/2022 Last pap smear/pelvic exam: 2014, remote hysterectomy, DONE DEXA: 03/2019 T -1.5; 08/2022 -1.5 (no change)  Prior vaccinations: TD or Tdap:  2013  Influenza: 2021  Pneumococcal: 09/2013 Prevnar13: 2016 Shingles/Zostavax: 2017, ask insurance about shingrix  Covid 19: 2/2, moderna, declines booster  Names of Other Physician/Practitioners you currently use: 1. [facility]- here for primary care 2. Dr. Joya San, eye doctor, last visit 2023, glasses, goes annually 3. Dr. Leonides Sake, dentist, last visit 2023, goes q53m4. Dr. [facility], last 2020, looking for new provider   Patient Care Team: MUnk Pinto MD as PCP - General (Internal Medicine) MRichmond Campbell MD as Consulting Physician (Gastroenterology) HAllyn Kenner MD as Consulting Physician (Dermatology) WGarald Balding MD (Inactive) as Consulting Physician (Orthopedic Surgery)  Allergies Allergies  Allergen Reactions   Doxycycline    Erythromycin    Flagyl [Metronidazole]    Penicillins    Sulfa Antibiotics     SURGICAL HISTORY She  has a past surgical history that includes Abdominal hysterectomy; Tubal ligation; Lumbar laminectomy (2000); Carpal tunnel release (Right, 2004); Appendectomy; Microdiscectomy lumbar; and Shoulder arthroscopy with rotator cuff repair (Right, 12/21/2020).   FAMILY HISTORY Her family history includes Alcohol abuse in her father; Cancer in her father; Diabetes in her father and sister; Heart disease in her mother; Hypertension in her mother; Stroke in her mother.   SOCIAL HISTORY She  reports that she quit smoking  about 13 years ago. Her smoking use included cigarettes. She has a 30.00 pack-year smoking history. She has never used smokeless tobacco. She reports that she does not drink alcohol and does not use drugs.  MEDICARE WELLNESS OBJECTIVES: Physical activity:   Cardiac risk factors:   Depression/mood screen:      09/26/2022   12:16 PM  Depression screen PHQ 2/9  Decreased Interest 0  Down, Depressed, Hopeless 0  PHQ - 2 Score 0    ADLs:     09/26/2022   12:01 PM 06/13/2022   10:46 PM  In your present state of health, do you have any difficulty performing the following activities:  Hearing? 0 0  Vision? 0 0  Comment Follows Dr. TJoya Sanyearly   Difficulty concentrating or making decisions? 0 0  Walking or climbing stairs? 0 0  Dressing or bathing? 0 0  Doing errands, shopping? 0 0  Preparing Food and eating ? N   Using the Toilet? N   In the past six months, have you accidently leaked urine? N   Do you have problems with loss of bowel control? N   Managing your Medications? N   Managing your Finances? N   Housekeeping or managing your Housekeeping? N      Cognitive Testing  Alert? Yes  Normal Appearance?Yes  Oriented to person? Yes  Place? Yes   Time? Yes  Recall of three objects?  Yes  Can perform simple calculations? Yes  Displays appropriate judgment?Yes  Can read the correct time from a watch face?Yes  EOL planning:      Review of Systems  Constitutional: Negative.  Negative for malaise/fatigue and weight loss.  HENT: Negative.  Negative for hearing loss and tinnitus.   Eyes: Negative.  Negative for blurred vision and double vision.  Respiratory: Negative.  Negative for cough, sputum production, shortness of breath and wheezing.   Cardiovascular: Negative.  Negative for chest pain, palpitations, orthopnea, claudication, leg swelling and PND.  Gastrointestinal: Negative.  Negative for abdominal pain, blood in stool, constipation, diarrhea, heartburn, melena, nausea  and vomiting.  Genitourinary: Negative.   Musculoskeletal:  Positive for joint pain (R shoulder). Negative for falls and myalgias.  Skin: Negative.  Negative for rash.  Neurological: Negative.  Negative for dizziness, tingling, sensory change, weakness and headaches.  Endo/Heme/Allergies: Negative.  Negative for polydipsia.  Psychiatric/Behavioral: Negative.  Negative for depression, memory loss, substance abuse and suicidal ideas. The patient is not nervous/anxious and does not have insomnia.   All other systems reviewed and are negative.    Objective:   Today's Vitals   09/26/22 1130  BP: (!) 154/78  Pulse: 72  Temp: (!) 97.5 F (36.4 C)  SpO2: 97%  Weight: 147 lb (66.7 kg)  Height: 5' 3"$  (1.6 m)    General appearance: alert, no distress, WD/WN,  female HEENT: normocephalic, sclerae anicteric, TMs pearly, nares patent, no discharge or erythema, pharynx normal Oral cavity: MMM, no lesions Neck: supple, no lymphadenopathy, no thyromegaly, no masses Heart: RRR, normal S1, S2, systolic murmur with radiation to carotids and best heard at RSB Lungs: CTA bilaterally, no wheezes, rhonchi, or rales Abdomen: +bs, soft, non tender, non distended, no masses, no hepatomegaly, no splenomegaly Musculoskeletal: nontender, no swelling, no obvious deformity; R shoulder in sling Extremities: no edema, no cyanosis, no clubbing Pulses: 2+ symmetric, upper and lower extremities, normal cap refill Neurological: alert, oriented x 3, CN2-12 intact, strength normal upper extremities and lower extremities, sensation normal throughout, DTRs 2+ throughout, no cerebellar signs, gait normal Psychiatric: normal affect, behavior normal, pleasant    Medicare Attestation I have personally reviewed: The patient's medical and social history Their use of alcohol, tobacco or illicit drugs Their current medications and supplements The patient's functional ability including ADLs,fall risks, home safety risks,  cognitive, and hearing and visual impairment Diet and physical activities Evidence for depression or mood disorders  The patient's weight, height, BMI, and visual acuity have been recorded in the chart.  I have made referrals, counseling, and provided education to the patient based on review of the above and I have provided the patient with a written personalized care plan for preventive services.     Darrol Jump, NP   09/26/2022

## 2022-09-27 LAB — CBC WITH DIFFERENTIAL/PLATELET
Absolute Monocytes: 302 cells/uL (ref 200–950)
Basophils Absolute: 52 cells/uL (ref 0–200)
Basophils Relative: 1 %
Eosinophils Absolute: 213 cells/uL (ref 15–500)
Eosinophils Relative: 4.1 %
HCT: 39.5 % (ref 35.0–45.0)
Hemoglobin: 13.5 g/dL (ref 11.7–15.5)
Lymphs Abs: 2569 cells/uL (ref 850–3900)
MCH: 30.5 pg (ref 27.0–33.0)
MCHC: 34.2 g/dL (ref 32.0–36.0)
MCV: 89.2 fL (ref 80.0–100.0)
MPV: 9.4 fL (ref 7.5–12.5)
Monocytes Relative: 5.8 %
Neutro Abs: 2064 cells/uL (ref 1500–7800)
Neutrophils Relative %: 39.7 %
Platelets: 305 10*3/uL (ref 140–400)
RBC: 4.43 10*6/uL (ref 3.80–5.10)
RDW: 12.7 % (ref 11.0–15.0)
Total Lymphocyte: 49.4 %
WBC: 5.2 10*3/uL (ref 3.8–10.8)

## 2022-09-27 LAB — LIPID PANEL
Cholesterol: 196 mg/dL (ref <200)
HDL: 49 mg/dL — ABNORMAL LOW (ref >=50)
LDL Cholesterol (Calc): 113 mg/dL (calc) — ABNORMAL HIGH
Non-HDL Cholesterol (Calc): 147 mg/dL (calc) — ABNORMAL HIGH (ref <130)
Total CHOL/HDL Ratio: 4 (calc) (ref <5.0)
Triglycerides: 218 mg/dL — ABNORMAL HIGH (ref <150)

## 2022-09-27 LAB — COMPLETE METABOLIC PANEL WITH GFR
AG Ratio: 2 (calc) (ref 1.0–2.5)
ALT: 28 U/L (ref 6–29)
AST: 21 U/L (ref 10–35)
Albumin: 4.9 g/dL (ref 3.6–5.1)
Alkaline phosphatase (APISO): 62 U/L (ref 37–153)
BUN: 12 mg/dL (ref 7–25)
CO2: 28 mmol/L (ref 20–32)
Calcium: 10.1 mg/dL (ref 8.6–10.4)
Chloride: 104 mmol/L (ref 98–110)
Creat: 0.77 mg/dL (ref 0.60–1.00)
Globulin: 2.5 g/dL (calc) (ref 1.9–3.7)
Glucose, Bld: 89 mg/dL (ref 65–99)
Potassium: 4.5 mmol/L (ref 3.5–5.3)
Sodium: 142 mmol/L (ref 135–146)
Total Bilirubin: 0.7 mg/dL (ref 0.2–1.2)
Total Protein: 7.4 g/dL (ref 6.1–8.1)
eGFR: 81 mL/min/{1.73_m2} (ref >=60)

## 2022-09-27 LAB — HEMOGLOBIN A1C
Hgb A1c MFr Bld: 6.1 % of total Hgb — ABNORMAL HIGH (ref <5.7)
Mean Plasma Glucose: 128 mg/dL
eAG (mmol/L): 7.1 mmol/L

## 2022-09-27 LAB — VITAMIN D 25 HYDROXY (VIT D DEFICIENCY, FRACTURES): Vit D, 25-Hydroxy: 96 ng/mL (ref 30–100)

## 2022-10-16 ENCOUNTER — Other Ambulatory Visit: Payer: Self-pay | Admitting: Nurse Practitioner

## 2022-10-16 DIAGNOSIS — K5901 Slow transit constipation: Secondary | ICD-10-CM

## 2022-10-19 IMAGING — MR MR SHOULDER*R* W/O CM
4 of 5 series · 23 of 40 positions shown · non-contrast
Comparison: MRI right shoulder 03/07/2017. Plain films right
shoulder 01/18/2020.

CLINICAL DATA: Right shoulder pain radiating into the elbow for 6
weeks. Numbness in the fingers of the right hand.

EXAM:
MRI OF THE RIGHT SHOULDER WITHOUT CONTRAST
TECHNIQUE: Multiplanar, multisequence MR imaging of the shoulder was performed.
No intravenous contrast was administered.

[Series 6: T2 fat-sat · axial · right · 3.0mm · 0.59mm/px · z∈[-31,+53]mm · 9 of 27 slices shown (1 of 3)]
[im 1/27]
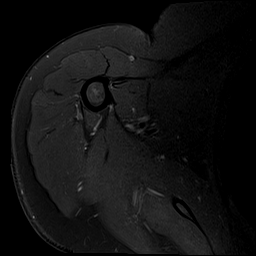
[im 4/27]
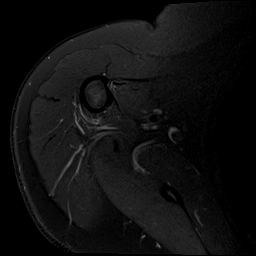
[im 7/27]
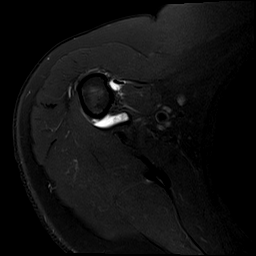
[im 10/27]
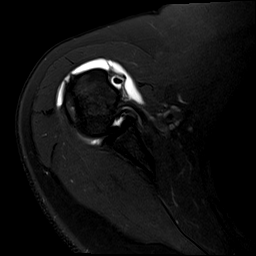
[im 14/27]
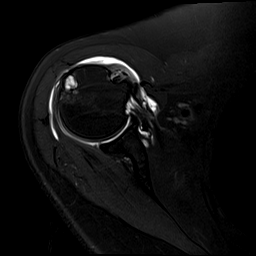
[im 17/27]
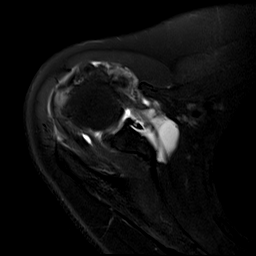
[im 20/27]
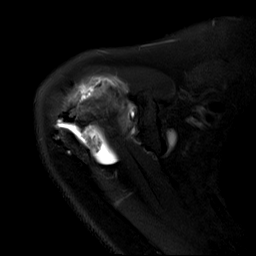
[im 23/27]
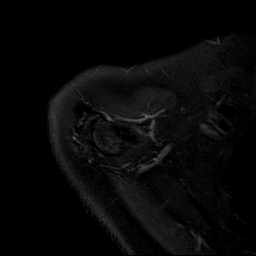
[im 27/27]
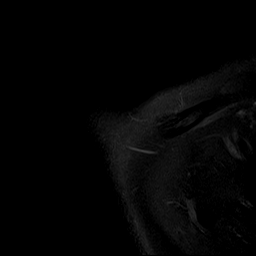

[Series 7: T2 fat-sat · oblique · right · 4.0mm · 0.27mm/px · 4 of 23 slices shown (2 of 3)]
[im 1/23]
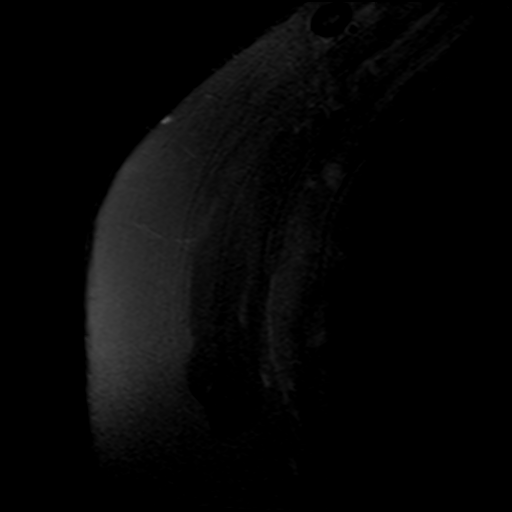
[im 4/23]
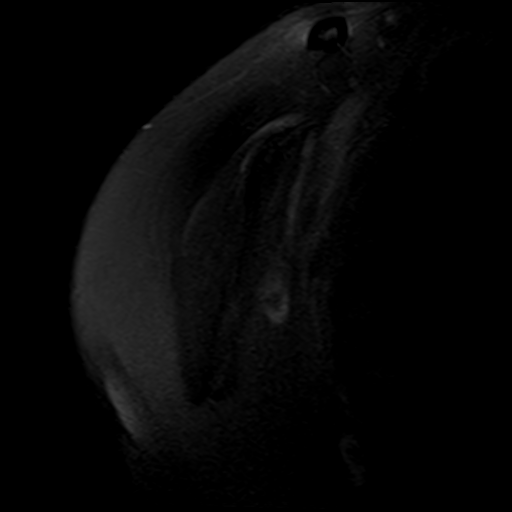
[im 13/23]
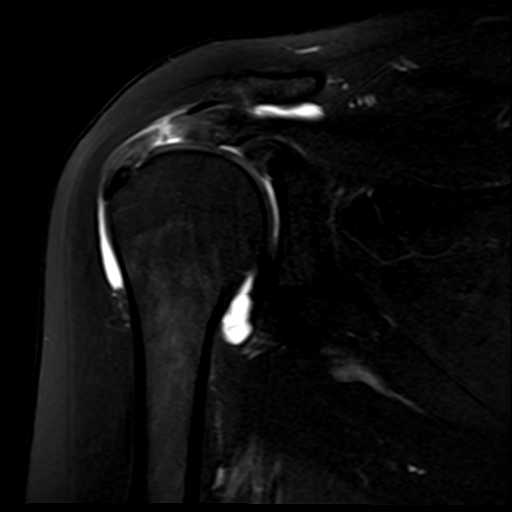
[im 19/23]
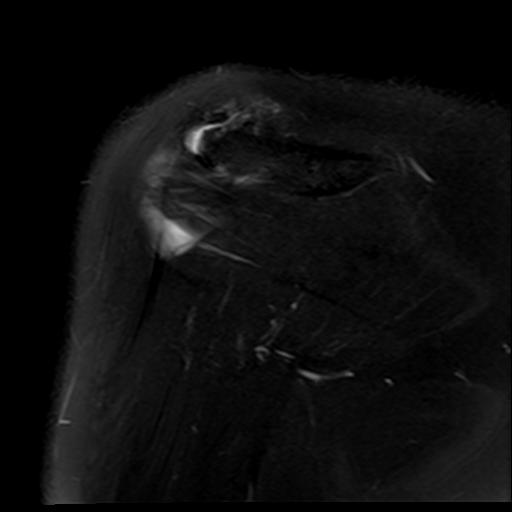

[Series 9: T2 fat-sat · coronal · right · 4.0mm · 0.44mm/px · 3 of 23 slices shown (3 of 3)]
[im 4/23]
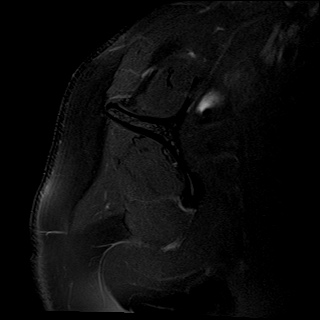
[im 13/23]
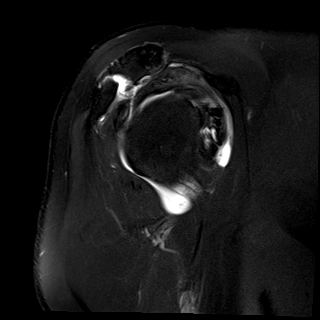
[im 19/23]
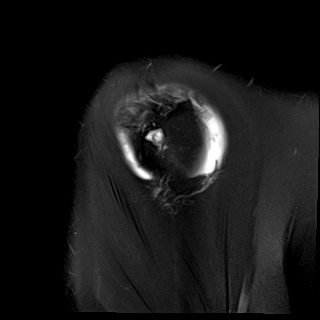

[Series 11: PD · oblique · right · 4.0mm · 0.22mm/px · 7 of 21 slices shown]
[im 1/21]
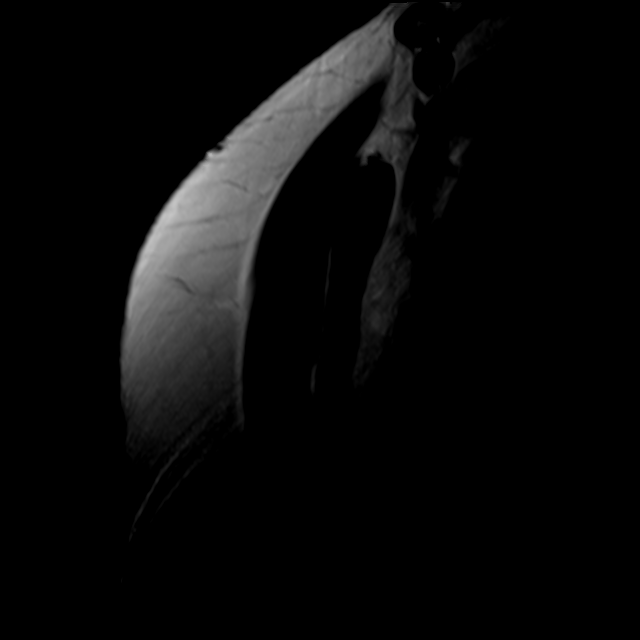
[im 4/21]
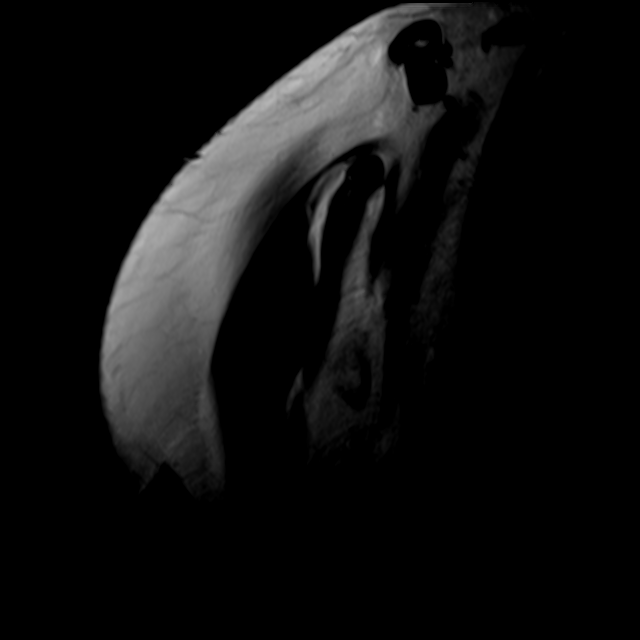
[im 7/21]
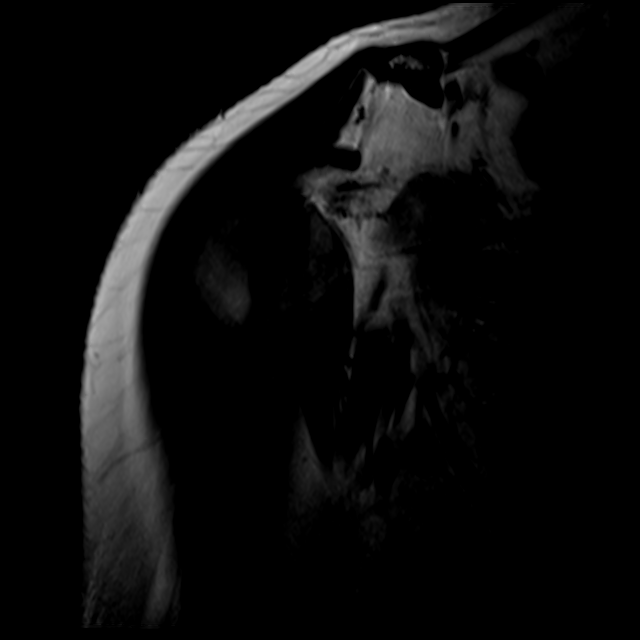
[im 11/21]
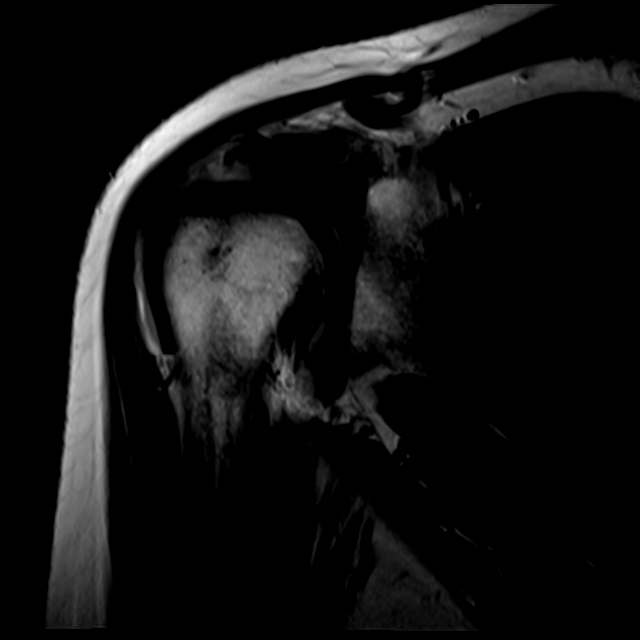
[im 14/21]
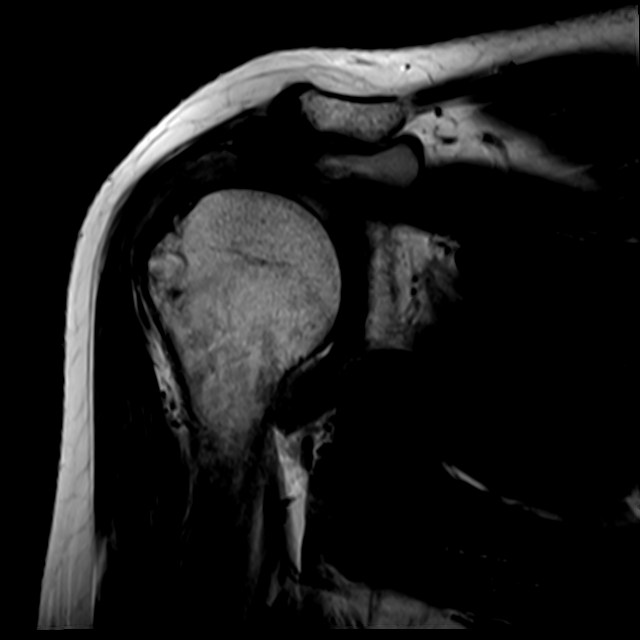
[im 17/21]
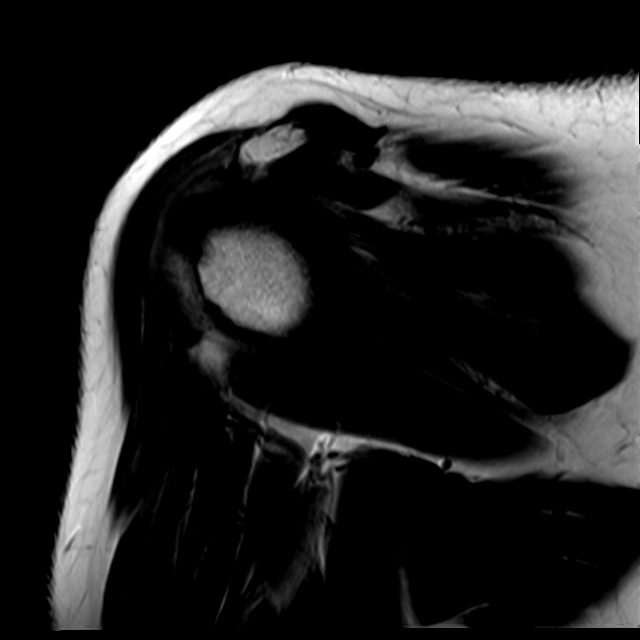
[im 21/21]
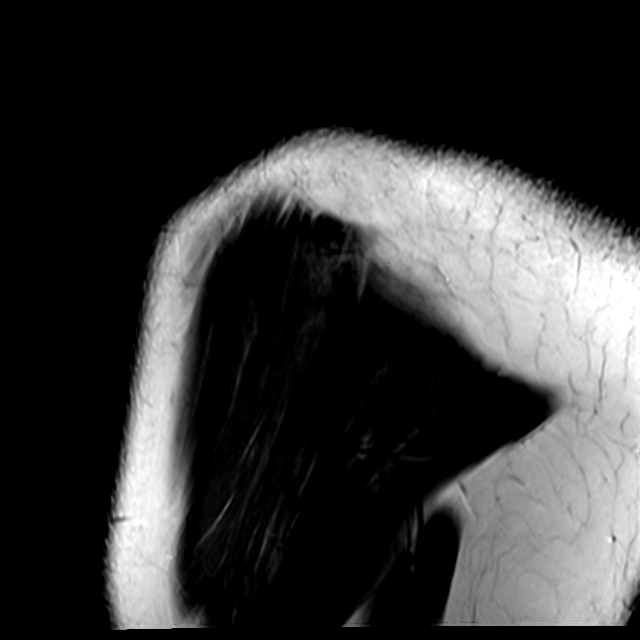

[23 of 40 positions shown; findings below may reference images not displayed]

FINDINGS: Rotator cuff: The patient has severe rotator cuff tendinopathy. The
supraspinatus is completely torn. The tear is mainly in the critical
zone approximately 1 cm medial to its insertion with a gap between
tendon fragments of 1-2 cm. There is also a near full-thickness
undersurface tear throughout the subscapularis with laminar
retraction to near the glenoid.

Muscles:  No atrophy or focal lesion.

Biceps long head: Severe tendinopathy of both the intra and
extra-articular segments. There is longitudinal split tearing of the
tendon in the bicipital groove with 2 sub tendons identified.

Acromioclavicular Joint: Mild-to-moderate osteoarthritis. Type 2
acromion. A moderate to moderately large volume fluid is seen in the
subacromial/subdeltoid bursa. Meso acromion type os acromiale with
advanced degenerative change about the synchondrosis.

Glenohumeral Joint: Mild degenerative change.

Labrum: The superior labrum is severely degenerated and blunted. No
focal intrasubstance increased T2 signal is seen within the labrum
to suggest tear.

Bones: No fracture or worrisome lesion. Degenerative cysts in the
greater tuberosity from rotator cuff tendinopathy noted.

Other: None.
IMPRESSION: Markedly worsened appearance of the shoulder.

Severe rotator cuff tendinopathy with a complete supraspinatus
tendon tear in the critical zone. Gap between tendon fragments is
1-2 cm. There is also a near full-thickness undersurface tear
throughout the subscapularis with retraction to the glenoid but no
atrophy.

Severe intra and extra-articular long head of biceps tendinopathy.
Longitudinal split tearing of the tendon the bicipital groove noted.

Os acromiale with advanced degenerative disease about the
synchondrosis. Mild acromioclavicular osteoarthritis noted.

Moderate to moderately large volume of subacromial/subdeltoid fluid
consistent with bursitis.

## 2022-11-14 ENCOUNTER — Other Ambulatory Visit: Payer: Self-pay | Admitting: Nurse Practitioner

## 2022-12-29 ENCOUNTER — Encounter: Payer: Self-pay | Admitting: Internal Medicine

## 2022-12-29 NOTE — Progress Notes (Unsigned)
Future Appointments  Date Time Provider Department  12/30/2022               6 mo  10:30 AM Kathryn Cowboy, MD [facility]  06/20/2023                cpe 11:00 AM Kathryn Cowboy, MD [facility]  12/30/2023 11:00 AM Kathryn Glimpse, NP [facility]    History of Present Illness:        This very nice 72 y.o. MWF with  HTN, HLD, Prediabetes  GERD, IBS,   Aortic Atherosclerosis  and Vitamin D Deficiency  presents for  6 month f/u.         Patient is  followed expectantly for labile HTN   & BP has been controlled at home. Today's  . Patient has had no complaints of any cardiac type chest pain, palpitations, dyspnea Kathryn Lamb /PND, dizziness, claudication or dependent edema.        Hyperlipidemia is not controlled with diet & Atorvastatin. Patient denies myalgias or other med SE's. Last Lipids were not at goal :  Lab Results  Component Value Date   CHOL 196 09/26/2022   HDL 49 (L) 09/26/2022   LDLCALC 113 (H) 09/26/2022   TRIG 218 (H) 09/26/2022   CHOLHDL 4.0 09/26/2022     Also, the patient has history of  PreDiabetes and has had no symptoms of reactive hypoglycemia, diabetic polys, paresthesias or visual blurring.  Last A1c was not at goal :   Lab Results  Component Value Date   HGBA1C 6.1 (H) 09/26/2022                                                       Further, the patient also has history of Vitamin D Deficiency and supplements vitamin D . Last vitamin D was at goal :  Lab Results  Component Value Date   VD25OH 96 09/26/2022     Current Outpatient Medications on File Prior to Visit  Medication Sig   aspirin EC 81 MG tablet Take 81 mg by mouth daily. Swallow whole.   atorvastatin (LIPITOR) 80 MG tablet TAKE 1 TABLET BY MOUTH DAILY FOR CHOLESTEROL   buPROPion (WELLBUTRIN XL) 300 MG 24 hr tablet Take 1 tablet every Morning for Mood,  Focus & Concentration                                                  /              TAKE                                         BY                                              MOUTH   cetirizine (ZYRTEC) 10 MG tablet Take 10 mg by mouth daily.   Cholecalciferol (VITAMIN D) 125 MCG (5000 UT) CAPS Take 1 capsule  Daily   cyanocobalamin 1000 MCG tablet Take 1,000 mcg by mouth daily.   diclofenac Sodium (VOLTAREN) 1 % GEL Apply  2 grams   2 x /day  1 to Painful Joint (Patient not taking: Reported on 09/26/2022)   famotidine (PEPCID) 40 MG tablet TAKE 1 TABLET BY MOUTH  DAILY FOR ACID REFLUX AND  INDIGESTION   gabapentin (NEURONTIN) 300 MG capsule Take 1 capsule 3 x /day for Pain   LINZESS 72 MCG capsule TAKE 1 CAPSULE BY MOUTH DAILY  BEFORE BREAKFAST   Multiple Vitamin (MULTIVITAMIN WITH MINERALS) TABS tablet Take 1 tablet by mouth. Takes 3 times per week. M,W,F   Probiotic Product (PROBIOTIC DAILY PO) Take by mouth daily.   VITAMIN K PO Take by mouth daily.   No current facility-administered medications on file prior to visit.      Allergies  Allergen Reactions   Doxycycline    Erythromycin    Flagyl [Metronidazole]    Penicillins    Sulfa Antibiotics       PMHx:   Past Medical History:  Diagnosis Date   Anxiety    Complete tear of right rotator cuff 01/18/2021   Depression    Diverticulitis    Ischemic colitis (HCC)    Mixed hyperlipidemia 10/05/2013   Rosacea       Immunization History  Administered Date(s) Administered   Influenza Split 05/17/2013, 05/17/2014   Influenza, High Dose  06/01/2018, 08/02/2019   Influenza 06/03/2018   Moderna Sars-Covid-2 Vacc 05/01/2020, 05/29/2020   PPD Test 05/04/2014   Pneumococcal -13 05/09/2015   Pneumococcal -23 10/04/2013   Td 08/30/2002   Tdap 04/07/2012   Varicella Zoster Immune Globulin 03/15/2016      Past Surgical History:  Procedure Laterality Date   ABDOMINAL HYSTERECTOMY     APPENDECTOMY     CARPAL TUNNEL RELEASE Right 2004   LUMBAR LAMINECTOMY  2000   MICRODISCECTOMY LUMBAR     SHOULDER ARTHROSCOPY WITH ROTATOR CUFF REPAIR Right 12/21/2020    Dr. Cleophas Lamb   TUBAL LIGATION       FHx:    Reviewed / unchanged   SHx:    Reviewed / unchanged    Systems Review:  Constitutional: Denies fever, chills, wt changes, headaches, insomnia, fatigue, night sweats, change in appetite. Eyes: Denies redness, blurred vision, diplopia, discharge, itchy, watery eyes.  ENT: Denies discharge, congestion, post nasal drip, epistaxis, sore throat, earache, hearing loss, dental pain, tinnitus, vertigo, sinus pain, snoring.  CV: Denies chest pain, palpitations, irregular heartbeat, syncope, dyspnea, diaphoresis, orthopnea, PND, claudication or edema. Respiratory: denies cough, dyspnea, DOE, pleurisy, hoarseness, laryngitis, wheezing.  Gastrointestinal: Denies dysphagia, odynophagia, heartburn, reflux, water brash, abdominal pain or cramps, nausea, vomiting, bloating, diarrhea, constipation, hematemesis, melena, hematochezia  or hemorrhoids. Genitourinary: Denies dysuria, frequency, urgency, nocturia, hesitancy, discharge, hematuria or flank pain. Musculoskeletal: Denies arthralgias, myalgias, stiffness, jt. swelling, pain, limping or strain/sprain.  Skin: Denies pruritus, rash, hives, warts, acne, eczema or change in skin lesion(s). Neuro: No weakness, tremor, incoordination, spasms, paresthesia or pain. Psychiatric: Denies confusion, memory loss or sensory loss. Endo: Denies change in weight, skin or hair change.  Heme/Lymph: No excessive bleeding, bruising or enlarged lymph nodes.   Physical Exam  There were no vitals taken for this visit.  Appears  well nourished, well groomed  and in no distress.  Eyes: PERRLA, EOMs, conjunctiva no swelling or erythema. Sinuses: No frontal/maxillary tenderness ENT/Mouth: EAC's clear, TM's nl w/o erythema, bulging. Nares clear w/o erythema, swelling, exudates. Oropharynx clear without  erythema or exudates. Oral hygiene is good. Tongue normal, non obstructing. Hearing intact.  Neck: Supple. Thyroid not  palpable. Car 2+/2+ without bruits, nodes or JVD. Chest: Respirations nl with BS clear & equal w/o rales, rhonchi, wheezing or stridor.  Cor: Heart sounds normal w/ regular rate and rhythm without sig. murmurs, gallops, clicks or rubs. Peripheral pulses normal and equal  without edema.  Abdomen: Soft & bowel sounds normal. Non-tender w/o guarding, rebound, hernias, masses or organomegaly.  Lymphatics: Unremarkable.  Musculoskeletal: Full ROM all peripheral extremities, joint stability, 5/5 strength and normal gait.  Skin: Warm, dry without exposed rashes, lesions or ecchymosis apparent.  Neuro: Cranial nerves intact, reflexes equal bilaterally. Sensory-motor testing grossly intact. Tendon reflexes grossly intact.  Pysch: Alert & oriented x 3.  Insight and judgement nl & appropriate. No ideations.   Assessment and Plan:  - Continue medication, monitor blood pressure at home.  - Continue DASH diet.  Reminder to go to the ER if any CP,  SOB, nausea, dizziness, severe HA, changes vision/speech.  - Continue diet/meds, exercise,& lifestyle modifications.  - Continue monitor periodic cholesterol/liver & renal functions    - Continue diet, exercise  - Lifestyle modifications.  - Monitor appropriate labs. - Continue supplementation.        Discussed  regular exercise, BP monitoring, weight control to achieve/maintain BMI less than 25 and discussed med and SE's. Recommended labs to assess /monitor clinical status .  I discussed the assessment and treatment plan with the patient. The patient was provided an opportunity to ask questions and all were answered. The patient agreed with the plan and demonstrated an understanding of the instructions.  I provided over 30 minutes of exam, counseling, chart review and  complex critical decision making.        The patient was advised to call back or seek an in-person evaluation if the symptoms worsen or if the condition fails to improve as  anticipated.   Marinus Maw, MD

## 2022-12-29 NOTE — Patient Instructions (Signed)

## 2022-12-30 ENCOUNTER — Ambulatory Visit (INDEPENDENT_AMBULATORY_CARE_PROVIDER_SITE_OTHER): Payer: Medicare Other | Admitting: Internal Medicine

## 2022-12-30 ENCOUNTER — Encounter: Payer: Self-pay | Admitting: Internal Medicine

## 2022-12-30 VITALS — BP 120/70 | HR 70 | Temp 97.9°F | Resp 16 | Ht 63.0 in | Wt 148.2 lb

## 2022-12-30 DIAGNOSIS — E559 Vitamin D deficiency, unspecified: Secondary | ICD-10-CM | POA: Diagnosis not present

## 2022-12-30 DIAGNOSIS — R0989 Other specified symptoms and signs involving the circulatory and respiratory systems: Secondary | ICD-10-CM

## 2022-12-30 DIAGNOSIS — I700 Atherosclerosis of aorta: Secondary | ICD-10-CM

## 2022-12-30 DIAGNOSIS — R7309 Other abnormal glucose: Secondary | ICD-10-CM | POA: Diagnosis not present

## 2022-12-30 DIAGNOSIS — E782 Mixed hyperlipidemia: Secondary | ICD-10-CM

## 2022-12-30 DIAGNOSIS — Z79899 Other long term (current) drug therapy: Secondary | ICD-10-CM | POA: Diagnosis not present

## 2022-12-31 LAB — COMPLETE METABOLIC PANEL WITH GFR
AG Ratio: 2.1 (calc) (ref 1.0–2.5)
ALT: 30 U/L — ABNORMAL HIGH (ref 6–29)
AST: 21 U/L (ref 10–35)
Albumin: 4.8 g/dL (ref 3.6–5.1)
Alkaline phosphatase (APISO): 62 U/L (ref 37–153)
BUN: 11 mg/dL (ref 7–25)
CO2: 31 mmol/L (ref 20–32)
Calcium: 10.1 mg/dL (ref 8.6–10.4)
Chloride: 104 mmol/L (ref 98–110)
Creat: 0.74 mg/dL (ref 0.60–1.00)
Globulin: 2.3 g/dL (calc) (ref 1.9–3.7)
Glucose, Bld: 114 mg/dL — ABNORMAL HIGH (ref 65–99)
Potassium: 4.3 mmol/L (ref 3.5–5.3)
Sodium: 143 mmol/L (ref 135–146)
Total Bilirubin: 0.6 mg/dL (ref 0.2–1.2)
Total Protein: 7.1 g/dL (ref 6.1–8.1)
eGFR: 85 mL/min/{1.73_m2} (ref >=60)

## 2022-12-31 LAB — CBC WITH DIFFERENTIAL/PLATELET
Absolute Monocytes: 345 cells/uL (ref 200–950)
Basophils Absolute: 42 cells/uL (ref 0–200)
Basophils Relative: 0.8 %
Eosinophils Absolute: 260 cells/uL (ref 15–500)
Eosinophils Relative: 4.9 %
HCT: 38.5 % (ref 35.0–45.0)
Hemoglobin: 12.8 g/dL (ref 11.7–15.5)
Lymphs Abs: 2237 cells/uL (ref 850–3900)
MCH: 30.5 pg (ref 27.0–33.0)
MCHC: 33.2 g/dL (ref 32.0–36.0)
MCV: 91.7 fL (ref 80.0–100.0)
MPV: 9.2 fL (ref 7.5–12.5)
Monocytes Relative: 6.5 %
Neutro Abs: 2417 cells/uL (ref 1500–7800)
Neutrophils Relative %: 45.6 %
Platelets: 281 10*3/uL (ref 140–400)
RBC: 4.2 10*6/uL (ref 3.80–5.10)
RDW: 12.4 % (ref 11.0–15.0)
Total Lymphocyte: 42.2 %
WBC: 5.3 10*3/uL (ref 3.8–10.8)

## 2022-12-31 LAB — LIPID PANEL
Cholesterol: 201 mg/dL — ABNORMAL HIGH (ref <200)
HDL: 51 mg/dL (ref >=50)
LDL Cholesterol (Calc): 118 mg/dL (calc) — ABNORMAL HIGH
Non-HDL Cholesterol (Calc): 150 mg/dL (calc) — ABNORMAL HIGH (ref <130)
Total CHOL/HDL Ratio: 3.9 (calc) (ref <5.0)
Triglycerides: 206 mg/dL — ABNORMAL HIGH (ref <150)

## 2022-12-31 LAB — HEMOGLOBIN A1C
Hgb A1c MFr Bld: 6.1 % of total Hgb — ABNORMAL HIGH (ref <5.7)
Mean Plasma Glucose: 128 mg/dL
eAG (mmol/L): 7.1 mmol/L

## 2022-12-31 LAB — INSULIN, RANDOM: Insulin: 21.8 u[IU]/mL — ABNORMAL HIGH

## 2022-12-31 LAB — MAGNESIUM: Magnesium: 2.2 mg/dL (ref 1.5–2.5)

## 2022-12-31 LAB — TSH: TSH: 4.69 mIU/L — ABNORMAL HIGH (ref 0.40–4.50)

## 2022-12-31 LAB — VITAMIN D 25 HYDROXY (VIT D DEFICIENCY, FRACTURES): Vit D, 25-Hydroxy: 89 ng/mL (ref 30–100)

## 2022-12-31 NOTE — Progress Notes (Signed)
^<^<^<^<^<^<^<^<^<^<^<^<^<^<^<^<^<^<^<^<^<^<^<^<^<^<^<^<^<^<^<^<^<^<^<^<^ ^>^>^>^>^>^>^>^>^>^>^>>^>^>^>^>^>^>^>^>^>^>^>^>^>^>^>^>^>^>^>^>^>^>^>^>^>  -Test results slightly outside the reference range are not unusual. If there is anything important, I will review this with you,  otherwise it is considered normal test values.  If you have further questions,  please do not hesitate to contact me at the office or via My Chart.   ^<^<^<^<^<^<^<^<^<^<^<^<^<^<^<^<^<^<^<^<^<^<^<^<^<^<^<^<^<^<^<^<^<^<^<^<^ ^>^>^>^>^>^>^>^>^>^>^>^>^>^>^>^>^>^>^>^>^>^>^>^>^>^>^>^>^>^>^>^>^>^>^>^>^  -  Total  Chol =   201   - Elevated             (  Ideal  or  Goal is less than 180  !  )  & -  Bad / Dangerous LDL  Chol =  118  - also Elevated              (  Ideal  or  Goal is less than 70  !  )    - Cholesterol is too high - Recommend  STRICTER low cholesterol diet    - Cholesterol only comes from animal sources                                                                                                    - ie. meat, dairy, egg yolks  - Eat all the vegetables you want.  - Avoid Meat, Avoid Meat,  Avoid Meat                                                                                - especially Red Meat - Beef AND Pork .  - Avoid cheese & dairy - milk & ice cream.     - Cheese is the most concentrated form of trans-fats which                                                                         is the worst thing to clog up our arteries.   - Veggie cheese is OK which can be found in the fresh                                      produce section at [employer] or [employer] or [employer]  ^<^<^<^<^<^<^<^<^<^<^<^<^<^<^<^<^<^<^<^<^<^<^<^<^<^<^<^<^<^<^<^<^<^<^<^<^ ^>^>^>^>^>^>^>^>^>^>^>^>^>^>^>^>^>^>^>^>^>^>^>^>^>^>^>^>^>^>^>^>^>^>^>^>^  -    ALso Triglycerides ( =  206     ) or fats in blood are too high                 (   Ideal or  Goal is less than 150  !  )    -  Recommend avoid fried &  greasy foods,  sweets / candy,   - Avoid white rice  (brown or wild rice or Quinoa is OK),   - Avoid white potatoes  (sweet potatoes are OK)   - Avoid anything made from white flour  - bagels, doughnuts, rolls, buns, biscuits, white and   wheat breads, pizza crust and traditional  pasta made of white flour & egg white  - (vegetarian pasta or spinach or wheat pasta is OK).    - Multi-grain bread is OK - like multi-grain flat bread or  sandwich thins.   - Avoid alcohol in excess.   - Exercise is also important.  ^<^<^<^<^<^<^<^<^<^<^<^<^<^<^<^<^<^<^<^<^<^<^<^<^<^<^<^<^<^<^<^<^<^<^<^<^ ^>^>^>^>^>^>^>^>^>^>^>^>^>^>^>^>^>^>^>^>^>^>^>^>^>^>^>^>^>^>^>^>^>^>^>^>^  -    TSH is slightly out of range , but not enough to recommend more meds   - PLEASE be sure NOT taking extra BIOTIN supplement which                                                                                        affects measurenment of TSH  !  - will continue to monitor at subsequent Office visits  ^<^<^<^<^<^<^<^<^<^<^<^<^<^<^<^<^<^<^<^<^<^<^<^<^<^<^<^<^<^<^<^<^<^<^<^<^ ^>^>^>^>^>^>^>^>^>^>^>^>^>^>^>^>^>^>^>^>^>^>^>^>^>^>^>^>^>^>^>^>^>^>^>^>^  -    A1c = 6.1% is Sugar too High -   - Avoid Sweets, Candy & White Stuff   - White Rice, White Potatoes, White Flour  - Breads &  Pasta  ^<^<^<^<^<^<^<^<^<^<^<^<^<^<^<^<^<^<^<^<^<^<^<^<^<^<^<^<^<^<^<^<^<^<^<^<^ ^>^>^>^>^>^>^>^>^>^>^>^>^>^>^>^>^>^>^>^>^>^>^>^>^>^>^>^>^>^>^>^>^>^>^>^>^  -    Vitamin D = 89  - Excellent - Please keep dose same   ^<^<^<^<^<^<^<^<^<^<^<^<^<^<^<^<^<^<^<^<^<^<^<^<^<^<^<^<^<^<^<^<^<^<^<^<^ ^>^>^>^>^>^>^>^>^>^>^>^>^>^>^>^>^>^>^>^>^>^>^>^>^>^>^>^>^>^>^>^>^>^>^>^>^  -   All Else - CBC - Kidneys - Electrolytes - Liver - Magnesium & Thyroid    - all  Normal /  OK ^<^<^<^<^<^<^<^<^<^<^<^<^<^<^<^<^<^<^<^<^<^<^<^<^<^<^<^<^<^<^<^<^<^<^<^<^ ^>^>^>^>^>^>^>^>^>^>^>^>^>^>^>^>^>^>^>^>^>^>^>^>^>^>^>^>^>^>^>^>^>^>^>^>^

## 2023-02-16 ENCOUNTER — Other Ambulatory Visit: Payer: Self-pay | Admitting: Internal Medicine

## 2023-02-16 DIAGNOSIS — G8929 Other chronic pain: Secondary | ICD-10-CM

## 2023-03-27 ENCOUNTER — Other Ambulatory Visit: Payer: Self-pay | Admitting: Internal Medicine

## 2023-04-06 ENCOUNTER — Other Ambulatory Visit: Payer: Self-pay | Admitting: Nurse Practitioner

## 2023-04-08 ENCOUNTER — Encounter: Payer: Self-pay | Admitting: Nurse Practitioner

## 2023-04-08 ENCOUNTER — Ambulatory Visit (INDEPENDENT_AMBULATORY_CARE_PROVIDER_SITE_OTHER): Payer: Medicare Other | Admitting: Nurse Practitioner

## 2023-04-08 VITALS — BP 140/80 | HR 60 | Temp 97.5°F | Ht 63.0 in | Wt 147.6 lb

## 2023-04-08 DIAGNOSIS — R0989 Other specified symptoms and signs involving the circulatory and respiratory systems: Secondary | ICD-10-CM | POA: Diagnosis not present

## 2023-04-08 DIAGNOSIS — Z87891 Personal history of nicotine dependence: Secondary | ICD-10-CM

## 2023-04-08 DIAGNOSIS — L719 Rosacea, unspecified: Secondary | ICD-10-CM | POA: Diagnosis not present

## 2023-04-08 DIAGNOSIS — I700 Atherosclerosis of aorta: Secondary | ICD-10-CM | POA: Diagnosis not present

## 2023-04-08 DIAGNOSIS — Z8719 Personal history of other diseases of the digestive system: Secondary | ICD-10-CM

## 2023-04-08 DIAGNOSIS — M545 Low back pain, unspecified: Secondary | ICD-10-CM

## 2023-04-08 DIAGNOSIS — R7989 Other specified abnormal findings of blood chemistry: Secondary | ICD-10-CM | POA: Diagnosis not present

## 2023-04-08 DIAGNOSIS — E559 Vitamin D deficiency, unspecified: Secondary | ICD-10-CM | POA: Diagnosis not present

## 2023-04-08 DIAGNOSIS — J439 Emphysema, unspecified: Secondary | ICD-10-CM

## 2023-04-08 DIAGNOSIS — K5901 Slow transit constipation: Secondary | ICD-10-CM

## 2023-04-08 DIAGNOSIS — G8929 Other chronic pain: Secondary | ICD-10-CM

## 2023-04-08 DIAGNOSIS — F3342 Major depressive disorder, recurrent, in full remission: Secondary | ICD-10-CM | POA: Diagnosis not present

## 2023-04-08 DIAGNOSIS — Z79899 Other long term (current) drug therapy: Secondary | ICD-10-CM | POA: Diagnosis not present

## 2023-04-08 DIAGNOSIS — K649 Unspecified hemorrhoids: Secondary | ICD-10-CM

## 2023-04-08 DIAGNOSIS — F419 Anxiety disorder, unspecified: Secondary | ICD-10-CM

## 2023-04-08 DIAGNOSIS — R7309 Other abnormal glucose: Secondary | ICD-10-CM | POA: Diagnosis not present

## 2023-04-08 NOTE — Progress Notes (Signed)
FOLLOW UP  Assessment:   Atherosclerosis of aorta (HCC) - Ct scan 01/07/2020/Hyperlipidemia Control blood pressure, cholesterol, glucose, increase exercise.  Currently implementing lifestyle modifications. Recommend a diet heavy in fruits and veggies, omega 3's. Decrease consumption of animal meats, cheeses, and dairy products. Remain active and exercise as tolerated. Continue to monitor. Check lipids/TSH  Recurrent major depressive disorder, in full remission (HCC) Remission with Wellbutrin Continue to monitor  Former smoker (30 pack year history, quit 2011) Chest CTA completed 02/2022 Lung-RADS 1S, negative.  Continue annual screening  Lung cancer screening with low dose CT discussed as recommended by guidelines based on age, number of pack year history.  Discussed risks of screening including but not limited to false positives on xray, further testing or consultation with specialist, and possible false negative CT as well.   Emphysema (HCC) Former smoker, per imaging, doing well with mucinex PRN  Anxiety Controlled Continue Wellbutrin.   Reviewed relaxation techniques.  Sleep hygiene. Recommended mindfulness meditation and exercise.   Encouraged personality growth wand development through coping techniques and problem-solving skills. Limit/Decrease/Monitor drug/alcohol intake.     Abnormal glucose (prediabetes) Education: Reviewed 'ABCs' of diabetes management  Discussed goals to be met and/or maintained include A1C (<7) Blood pressure (<130/80) Cholesterol (LDL <70) Continue Eye Exam yearly  Continue Dental Exam Q6 mo Discussed dietary recommendations Discussed Physical Activity recommendations Check A1C  Rosacea Follows with Derm  Hx of Ischemic colitis Controlled without recent episodes, avoid triggers, continue follow up GI as recommended  Vitamin D deficiency Continue supplement for goal and maintenance of 60-100 Monitor levels  Medication  management All medications discussed and reviewed in full. All questions and concerns regarding medications addressed.    Bilateral low back pain without sciatica Controlled Continue gabapentin, cyanocobalamin, voltaren PRN  Labile htn Recently fairly controlled by lifestyle  Discussed DASH (Dietary Approaches to Stop Hypertension) DASH diet is lower in sodium than a typical American diet. Cut back on foods that are high in saturated fat, cholesterol, and trans fats. Eat more whole-grain foods, fish, poultry, and nuts Remain active and exercise as tolerated daily.  Monitor BP at home-Call if greater than 130/80.  Check CMP/CBC  Constipation/Hemorrhoid Unable to afford Linzess Continue stool softener Stay well hydrated Discussed referral to GI for assessment of hemorrhoid removal if needed.  Elevated TSH Check TSH  Orders Placed This Encounter  Procedures   CBC with Differential/Platelet   COMPLETE METABOLIC PANEL WITH GFR   Lipid panel   Hemoglobin A1c   TSH   Notify office for further evaluation and treatment, questions or concerns if any reported s/s fail to improve.   The patient was advised to call back or seek an in-person evaluation if any symptoms worsen or if the condition fails to improve as anticipated.   Further disposition pending results of labs. Discussed med's effects and SE's.    I discussed the assessment and treatment plan with the patient. The patient was provided an opportunity to ask questions and all were answered. The patient agreed with the plan and demonstrated an understanding of the instructions.  Discussed med's effects and SE's. Screening labs and tests as requested with regular follow-up as recommended.  I provided 35 minutes of face-to-face time during this encounter including counseling, chart review, and critical decision making was preformed.  Future Appointments  Date Time Provider Department Center  07/15/2023  3:00 PM Adela Glimpse, NP [facility] None  10/22/2023 11:30 AM Adela Glimpse, NP [facility] None  01/26/2024 11:00 AM Oneta Rack,  Chrissie Noa, MD [facility] None     Future Appointments  Date Time Provider Department Center  07/15/2023  3:00 PM Adela Glimpse, NP [facility] None  10/22/2023 11:30 AM Adela Glimpse, NP [facility] None  01/26/2024 11:00 AM Lucky Cowboy, MD [facility] None    Subjective:   Kathryn Lamb is a 72 y.o. female who presents for wellness and 3 month follow up on hypertension, prediabetes, hyperlipidemia, vitamin D def.   Overall reports feeling well today.  She has no new concerns at this time.  She has hx of constipation, with painful external hemorrhoid.  She was unable to afford Linzess.  She has been taking nightly stool softener and staying well hydrated. Continues to have Type I Bristol Stools, BM every 3-4 days.    Mother passed after hospice in 2021, she is on wellbutrin 300 mg and feels she is doing well.   She is a former smoker, quit in 2010 with 30 pack/year smoking history. She had low dose lung cancer screening CT 12/2019 which showed emphysematous changes.  Most recent 02/2022 negative with annual re-screen.  She has R shoulder arthroscopic rotator cuff repair by Dr. Lessie Dings 12/21/2020, doing well, PT twice a week, has follow up in a few weeks. Currently in a sling.  BMI is Body mass index is 26.15 kg/m., she has been working on diet and exercise, Wt Readings from Last 3 Encounters:  04/08/23 147 lb 9.6 oz (67 kg)  12/30/22 148 lb 3.2 oz (67.2 kg)  09/26/22 147 lb (66.7 kg)   Her blood pressure has been controlled at home, today their BP is BP: (!) 140/80 She does workout. She denies chest pain, shortness of breath, dizziness. Not currently on BP  medications.  Controlled thorough lifestyle.   She has aortic atherosclerosis and CAD per CT 12/2019.  Currently on Atorvastatin, she increased to 80 mg last visit, and denies myalgias. Her cholesterol is at goal.  The cholesterol last visit was:   Lab Results  Component Value Date   CHOL 201 (H) 12/30/2022   HDL 51 12/30/2022   LDLCALC 118 (H) 12/30/2022   TRIG 206 (H) 12/30/2022   CHOLHDL 3.9 12/30/2022   She has been working on diet and exercise for prediabetes, and denies paresthesia of the feet, polydipsia, polyuria and visual disturbances. Last A1C in the office was:  Lab Results  Component Value Date   HGBA1C 6.1 (H) 12/30/2022   Patient is on Vitamin D supplement. Lab Results  Component Value Date   VD25OH 89 12/30/2022       Medication Review   Current Outpatient Medications (Cardiovascular):    atorvastatin (LIPITOR) 80 MG tablet, TAKE 1 TABLET BY MOUTH DAILY FOR CHOLESTEROL  Current Outpatient Medications (Respiratory):    cetirizine (ZYRTEC) 10 MG tablet, Take 10 mg by mouth daily.  Current Outpatient Medications (Analgesics):    aspirin EC 81 MG tablet, Take 81 mg by mouth daily. Swallow whole.  Current Outpatient Medications (Hematological):    cyanocobalamin 1000 MCG tablet, Take 1,000 mcg by mouth daily.  Current Outpatient Medications (Other):    buPROPion (WELLBUTRIN XL) 300 MG 24 hr tablet, Take  1 tablet  Daily for Mood , Focus & Concentration                                            /  TAKE                                         BY                                                 MOUTH   Cholecalciferol (VITAMIN D) 125 MCG (5000 UT) CAPS, Take 1 capsule Daily   famotidine (PEPCID) 40 MG tablet, TAKE 1 TABLET BY MOUTH DAILY FOR ACID REFLUX AND INDIGESTION   gabapentin (NEURONTIN) 300 MG capsule, TAKE 1 CAPSULE BY MOUTH 3 TIMES  DAILY FOR PAIN   Multiple Vitamin (MULTIVITAMIN WITH MINERALS) TABS tablet, Take 1 tablet by mouth daily.   Probiotic Product (PROBIOTIC DAILY PO), Take by mouth daily.   VITAMIN K PO, Take by mouth daily.   diclofenac Sodium (VOLTAREN) 1 % GEL, Apply  2 grams   2 x /day  1  to Painful Joint (Patient not taking: Reported on 09/26/2022)   LINZESS 72 MCG capsule, TAKE 1 CAPSULE BY MOUTH DAILY  BEFORE BREAKFAST (Patient not taking: Reported on 04/08/2023)  Current Problems (verified) Patient Active Problem List   Diagnosis Date Noted   Former smoker (30 pack/year, quit 2011) 02/07/2021   Labile hypertension 05/28/2020   Osteoarthritis of right AC (acromioclavicular) joint 01/18/2020   Os acromiale of right shoulder 01/18/2020   Carpal tunnel syndrome, left upper limb 01/18/2020   Aortic atherosclerosis (HCC) by Chest CT scan 01/07/2020 01/11/2020   Emphysema lung (HCC) 01/11/2020   Vitamin D deficiency 11/16/2015   Medication management 11/16/2015   Abnormal glucose 08/22/2014   Hyperlipidemia, mixed 10/05/2013   Rosacea    Anxiety    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HCC)    History of ischemic colitis     Screening Tests Immunization History  Administered Date(s) Administered   Influenza Split 05/17/2013, 05/17/2014   Influenza, High Dose Seasonal PF 05/09/2015, 06/18/2016, 05/30/2017, 06/01/2018, 08/02/2019   Influenza-Unspecified 06/03/2018   Moderna Sars-Covid-2 Vaccination 05/01/2020, 05/29/2020   PPD Test 05/04/2014   Pneumococcal Conjugate-13 05/09/2015   Pneumococcal Polysaccharide-23 10/04/2013   Td 08/30/2002   Tdap 04/07/2012   Varicella Zoster Immune Globulin 03/15/2016   Patient Care Team: Lucky Cowboy, MD as PCP - General (Internal Medicine) Sharrell Ku, MD as Consulting Physician (Gastroenterology) Nita Sells, MD as Consulting Physician (Dermatology) Valeria Batman, MD (Inactive) as Consulting Physician (Orthopedic Surgery)  Allergies Allergies  Allergen Reactions   Doxycycline    Erythromycin    Flagyl [Metronidazole]    Penicillins    Sulfa Antibiotics    SURGICAL HISTORY She  has a past surgical history that includes Abdominal hysterectomy; Tubal ligation; Lumbar laminectomy (2000); Carpal tunnel release  (Right, 2004); Appendectomy; Microdiscectomy lumbar; and Shoulder arthroscopy with rotator cuff repair (Right, 12/21/2020).   FAMILY HISTORY Her family history includes Alcohol abuse in her father; Cancer in her father; Diabetes in her father and sister; Heart disease in her mother; Hypertension in her mother; Stroke in her mother.   SOCIAL HISTORY She  reports that she quit smoking about 14 years ago. Her smoking use included cigarettes. She started smoking about 44 years ago. She has a 30 pack-year smoking history. She has never used smokeless tobacco. She reports that she does not drink alcohol and  does not use drugs.  Review of Systems  Constitutional: Negative.  Negative for malaise/fatigue and weight loss.  HENT: Negative.  Negative for hearing loss and tinnitus.   Eyes: Negative.  Negative for blurred vision and double vision.  Respiratory: Negative.  Negative for cough, sputum production, shortness of breath and wheezing.   Cardiovascular: Negative.  Negative for chest pain, palpitations, orthopnea, claudication, leg swelling and PND.  Gastrointestinal: Negative.  Negative for abdominal pain, blood in stool, constipation, diarrhea, heartburn, melena, nausea and vomiting.  Genitourinary: Negative.   Musculoskeletal:  Positive for joint pain (R shoulder). Negative for falls and myalgias.  Skin: Negative.  Negative for rash.  Neurological: Negative.  Negative for dizziness, tingling, sensory change, weakness and headaches.  Endo/Heme/Allergies: Negative.  Negative for polydipsia.  Psychiatric/Behavioral: Negative.  Negative for depression, memory loss, substance abuse and suicidal ideas. The patient is not nervous/anxious and does not have insomnia.   All other systems reviewed and are negative.    Objective:   Today's Vitals   04/08/23 1132  BP: (!) 140/80  Pulse: 60  Temp: (!) 97.5 F (36.4 C)  SpO2: 98%  Weight: 147 lb 9.6 oz (67 kg)  Height: 5\' 3"  (1.6 m)   General  appearance: alert, no distress, WD/WN,  female HEENT: normocephalic, sclerae anicteric, TMs pearly, nares patent, no discharge or erythema, pharynx normal Oral cavity: MMM, no lesions Neck: supple, no lymphadenopathy, no thyromegaly, no masses Heart: RRR, normal S1, S2, systolic murmur with radiation to carotids and best heard at RSB Lungs: CTA bilaterally, no wheezes, rhonchi, or rales Abdomen: +bs, soft, non tender, non distended, no masses, no hepatomegaly, no splenomegaly Musculoskeletal: nontender, no swelling, no obvious deformity; R shoulder in sling Extremities: no edema, no cyanosis, no clubbing Pulses: 2+ symmetric, upper and lower extremities, normal cap refill Neurological: alert, oriented x 3, CN2-12 intact, strength normal upper extremities and lower extremities, sensation normal throughout, DTRs 2+ throughout, no cerebellar signs, gait normal Psychiatric: normal affect, behavior normal, pleasant   Donyea Beverlin, NP   04/08/2023

## 2023-04-08 NOTE — Patient Instructions (Signed)

## 2023-04-09 LAB — COMPLETE METABOLIC PANEL WITH GFR
AG Ratio: 1.9 (calc) (ref 1.0–2.5)
ALT: 31 U/L — ABNORMAL HIGH (ref 6–29)
AST: 23 U/L (ref 10–35)
Albumin: 4.8 g/dL (ref 3.6–5.1)
Alkaline phosphatase (APISO): 67 U/L (ref 37–153)
BUN: 11 mg/dL (ref 7–25)
CO2: 29 mmol/L (ref 20–32)
Calcium: 9.9 mg/dL (ref 8.6–10.4)
Chloride: 105 mmol/L (ref 98–110)
Creat: 0.81 mg/dL (ref 0.60–1.00)
Globulin: 2.5 g/dL (ref 1.9–3.7)
Glucose, Bld: 87 mg/dL (ref 65–99)
Potassium: 4.2 mmol/L (ref 3.5–5.3)
Sodium: 144 mmol/L (ref 135–146)
Total Bilirubin: 0.6 mg/dL (ref 0.2–1.2)
Total Protein: 7.3 g/dL (ref 6.1–8.1)
eGFR: 77 mL/min/{1.73_m2} (ref >=60)

## 2023-04-09 LAB — LIPID PANEL
Cholesterol: 181 mg/dL (ref <200)
HDL: 51 mg/dL (ref >=50)
LDL Cholesterol (Calc): 103 mg/dL — ABNORMAL HIGH
Non-HDL Cholesterol (Calc): 130 mg/dL — ABNORMAL HIGH (ref <130)
Total CHOL/HDL Ratio: 3.5 (calc) (ref <5.0)
Triglycerides: 153 mg/dL — ABNORMAL HIGH (ref <150)

## 2023-04-09 LAB — HEMOGLOBIN A1C
Hgb A1c MFr Bld: 6 %{Hb} — ABNORMAL HIGH (ref <5.7)
Mean Plasma Glucose: 126 mg/dL
eAG (mmol/L): 7 mmol/L

## 2023-04-09 LAB — CBC WITH DIFFERENTIAL/PLATELET
Absolute Monocytes: 300 {cells}/uL (ref 200–950)
Basophils Absolute: 30 {cells}/uL (ref 0–200)
Basophils Relative: 0.6 %
Eosinophils Absolute: 230 {cells}/uL (ref 15–500)
Eosinophils Relative: 4.6 %
HCT: 39.2 % (ref 35.0–45.0)
Hemoglobin: 12.9 g/dL (ref 11.7–15.5)
Lymphs Abs: 2430 {cells}/uL (ref 850–3900)
MCH: 30.1 pg (ref 27.0–33.0)
MCHC: 32.9 g/dL (ref 32.0–36.0)
MCV: 91.4 fL (ref 80.0–100.0)
MPV: 9.7 fL (ref 7.5–12.5)
Monocytes Relative: 6 %
Neutro Abs: 2010 {cells}/uL (ref 1500–7800)
Neutrophils Relative %: 40.2 %
Platelets: 269 10*3/uL (ref 140–400)
RBC: 4.29 10*6/uL (ref 3.80–5.10)
RDW: 12.2 % (ref 11.0–15.0)
Total Lymphocyte: 48.6 %
WBC: 5 10*3/uL (ref 3.8–10.8)

## 2023-04-09 LAB — TSH: TSH: 2.98 m[IU]/L (ref 0.40–4.50)

## 2023-06-20 ENCOUNTER — Encounter: Payer: Medicare Other | Admitting: Internal Medicine

## 2023-07-15 ENCOUNTER — Encounter: Payer: Self-pay | Admitting: Nurse Practitioner

## 2023-07-15 ENCOUNTER — Ambulatory Visit (INDEPENDENT_AMBULATORY_CARE_PROVIDER_SITE_OTHER): Payer: Medicare Other | Admitting: Nurse Practitioner

## 2023-07-15 VITALS — BP 130/70 | HR 66 | Temp 97.7°F | Ht 62.0 in | Wt 144.0 lb

## 2023-07-15 DIAGNOSIS — Z0001 Encounter for general adult medical examination with abnormal findings: Secondary | ICD-10-CM

## 2023-07-15 DIAGNOSIS — Z79899 Other long term (current) drug therapy: Secondary | ICD-10-CM

## 2023-07-15 DIAGNOSIS — E559 Vitamin D deficiency, unspecified: Secondary | ICD-10-CM

## 2023-07-15 DIAGNOSIS — K649 Unspecified hemorrhoids: Secondary | ICD-10-CM

## 2023-07-15 DIAGNOSIS — G8929 Other chronic pain: Secondary | ICD-10-CM

## 2023-07-15 DIAGNOSIS — R7309 Other abnormal glucose: Secondary | ICD-10-CM | POA: Diagnosis not present

## 2023-07-15 DIAGNOSIS — R0989 Other specified symptoms and signs involving the circulatory and respiratory systems: Secondary | ICD-10-CM | POA: Diagnosis not present

## 2023-07-15 DIAGNOSIS — E782 Mixed hyperlipidemia: Secondary | ICD-10-CM

## 2023-07-15 DIAGNOSIS — F3342 Major depressive disorder, recurrent, in full remission: Secondary | ICD-10-CM

## 2023-07-15 DIAGNOSIS — I700 Atherosclerosis of aorta: Secondary | ICD-10-CM

## 2023-07-15 DIAGNOSIS — J439 Emphysema, unspecified: Secondary | ICD-10-CM

## 2023-07-15 DIAGNOSIS — Z87891 Personal history of nicotine dependence: Secondary | ICD-10-CM

## 2023-07-15 DIAGNOSIS — L719 Rosacea, unspecified: Secondary | ICD-10-CM

## 2023-07-15 DIAGNOSIS — F419 Anxiety disorder, unspecified: Secondary | ICD-10-CM

## 2023-07-15 DIAGNOSIS — Z8719 Personal history of other diseases of the digestive system: Secondary | ICD-10-CM

## 2023-07-15 DIAGNOSIS — Z130 Encounter for screening for diseases of the blood and blood-forming organs and certain disorders involving the immune mechanism: Secondary | ICD-10-CM

## 2023-07-15 DIAGNOSIS — Z1389 Encounter for screening for other disorder: Secondary | ICD-10-CM

## 2023-07-15 DIAGNOSIS — Z0000 Encounter for general adult medical examination without abnormal findings: Secondary | ICD-10-CM | POA: Diagnosis not present

## 2023-07-15 DIAGNOSIS — K5901 Slow transit constipation: Secondary | ICD-10-CM

## 2023-07-15 NOTE — Progress Notes (Signed)
CPE  Assessment:   Atherosclerosis of aorta (HCC) - Ct scan 01/07/2020/Hyperlipidemia Control blood pressure, cholesterol, glucose, increase exercise.  Currently implementing lifestyle modifications. Recommend a diet heavy in fruits and veggies, omega 3's. Decrease consumption of animal meats, cheeses, and dairy products. Remain active and exercise as tolerated. Continue to monitor. Check lipids/TSH  Recurrent major depressive disorder, in full remission (HCC) Remission with Wellbutrin Mood stable and well controlled Continue to monitor  Former smoker (30 pack year history, quit 2011) Chest CTA completed 02/2022 Lung-RADS 1S, negative.  Continue annual screening  Lung cancer screening with low dose CT discussed as recommended by guidelines based on age, number of pack year history.  Discussed risks of screening including but not limited to false positives on xray, further testing or consultation with specialist, and possible false negative CT as well.   Emphysema (HCC) Former smoker, per imaging, doing well with mucinex PRN  Anxiety Controlled Continue Wellbutrin.   Reviewed relaxation techniques.  Sleep hygiene. Recommended mindfulness meditation and exercise.   Encouraged personality growth wand development through coping techniques and problem-solving skills. Limit/Decrease/Monitor drug/alcohol intake.    Abnormal glucose (prediabetes) Education: Reviewed 'ABCs' of diabetes management  Discussed goals to be met and/or maintained include A1C (<7) Blood pressure (<130/80) Cholesterol (LDL <70) Continue Eye Exam yearly  Continue Dental Exam Q6 mo Discussed dietary recommendations Discussed Physical Activity recommendations Check A1C  Rosacea Follows with Derm Plans to [facility]   Hx of Ischemic colitis Controlled without recent episodes, avoid triggers, continue follow up GI as recommended  Vitamin D deficiency Continue supplement for goal  and maintenance of 60-100 Monitor levels  Medication management All medications discussed and reviewed in full. All questions and concerns regarding medications addressed.    Bilateral low back pain without sciatica Controlled Continue gabapentin, cyanocobalamin, voltaren PRN  Labile Hypertension Recently fairly controlled by lifestyle  Discussed DASH (Dietary Approaches to Stop Hypertension) DASH diet is lower in sodium than a typical American diet. Cut back on foods that are high in saturated fat, cholesterol, and trans fats. Eat more whole-grain foods, fish, poultry, and nuts Remain active and exercise as tolerated daily.  Monitor BP at home-Call if greater than 130/80.  Check CMP/CBC  Constipation/Hemorrhoid Continue Linzess 72 mg Stay well hydrated Discussed referral to GI for assessment of hemorrhoid removal if needed. Continue Probiotic  Screening for hematuria or proteinuria Check and monitor Microalbumin / creatinine urine ratio Check and monitor Urinalysis, Routine w reflex microscopic  Screening for deficiency anemia Monitor CBC, iron, ferritin, tibc   Orders Placed This Encounter  Procedures   CBC with Differential/Platelet   COMPLETE METABOLIC PANEL WITH GFR   Lipid panel   Magnesium   TSH   Hemoglobin A1c   Insulin, random   VITAMIN D 25 Hydroxy (Vit-D Deficiency, Fractures)   Urinalysis, Routine w reflex microscopic   Microalbumin / creatinine urine ratio   Vitamin B12   EKG 12-Lead   Notify office for further evaluation and treatment, questions or concerns if any reported s/s fail to improve.   The patient was advised to call back or seek an in-person evaluation if any symptoms worsen or if the condition fails to improve as anticipated.   Further disposition pending results of labs. Discussed med's effects and SE's.    I discussed the assessment and treatment plan with the patient. The patient was provided an opportunity to ask questions and all  were answered. The patient agreed with the plan and  demonstrated an understanding of the instructions.  Discussed med's effects and SE's. Screening labs and tests as requested with regular follow-up as recommended.  I provided 40 minutes of face-to-face time during this encounter including counseling, chart review, and critical decision making was preformed.  Future Appointments  Date Time Provider Department Center  10/22/2023 11:30 AM Adela Glimpse, NP [facility] None  01/26/2024 11:00 AM Lucky Cowboy, MD [facility] None  08/03/2024  3:00 PM Lucky Cowboy, MD [facility] None   Plan:   During the course of the visit the patient was educated and counseled about appropriate screening and preventive services including:   Pneumococcal vaccine  Prevnar 13 Influenza vaccine Td vaccine Screening electrocardiogram Bone densitometry screening Colorectal cancer screening Diabetes screening Glaucoma screening Nutrition counseling  Advanced directives: requested   Future Appointments  Date Time Provider Department Center  10/22/2023 11:30 AM Adela Glimpse, NP [facility] None  01/26/2024 11:00 AM Lucky Cowboy, MD [facility] None  08/03/2024  3:00 PM Lucky Cowboy, MD [facility] None   Subjective:   Kathryn Lamb is a 72 y.o. female who presents for a physical 3 month follow up on hypertension, prediabetes, hyperlipidemia, vitamin D def.   Overall reports feeling well today.  She has no new concerns at this time.  Reports intermittent constipation, with painful external hemorrhoid.  Reports having to take daily stool softeners with Benefiber without relief.  Continues to have Type I Bristol Stools, BM every 3-4 days.    She is on wellbutrin 300 mg and feels she is doing well.   She is a former smoker, quit in 2010 with 30 pack/year smoking history. She had low dose lung cancer screening CT 12/2019 which showed emphysematous changes.  Most recent 02/2022 negative with  annual re-screen.  She has R shoulder arthroscopic rotator cuff repair by Dr. Lessie Dings 12/21/2020, doing well, PT twice a week, has follow up in a few weeks. Currently in a sling.  BMI is Body mass index is 26.34 kg/m., she has been working on diet and exercise, Wt Readings from Last 3 Encounters:  07/15/23 144 lb (65.3 kg)  04/08/23 147 lb 9.6 oz (67 kg)  12/30/22 148 lb 3.2 oz (67.2 kg)   Her blood pressure has been controlled at home, today their BP is BP: 130/70 She does workout. She denies chest pain, shortness of breath, dizziness. Not currently on BP  medications.  Controlled thorough lifestyle.   She has aortic atherosclerosis and CAD per CT 12/2019.  Currently on Atorvastatin, she increased to 80 mg last visit, and denies myalgias. Her cholesterol is at goal. The cholesterol last visit was:   Lab Results  Component Value Date   CHOL 181 04/08/2023   HDL 51 04/08/2023   LDLCALC 103 (H) 04/08/2023   TRIG 153 (H) 04/08/2023   CHOLHDL 3.5 04/08/2023   She has been working on diet and exercise for prediabetes, and denies paresthesia of the feet, polydipsia, polyuria and visual disturbances. Last A1C in the office was:  Lab Results  Component Value Date   HGBA1C 6.0 (H) 04/08/2023   Patient is on Vitamin D supplement. Lab Results  Component Value Date   VD25OH 89 12/30/2022     Medication Review   Current Outpatient Medications (Cardiovascular):    atorvastatin (LIPITOR) 80 MG tablet, TAKE 1 TABLET BY MOUTH DAILY FOR CHOLESTEROL  Current Outpatient Medications (Respiratory):    cetirizine (ZYRTEC) 10 MG tablet, Take 10 mg by mouth daily.  Current Outpatient Medications (Analgesics):    aspirin  EC 81 MG tablet, Take 81 mg by mouth daily. Swallow whole.  Current Outpatient Medications (Hematological):    cyanocobalamin 1000 MCG tablet, Take 1,000 mcg by mouth daily.  Current Outpatient Medications (Other):    buPROPion (WELLBUTRIN XL) 300 MG 24 hr tablet, Take  1  tablet  Daily for Mood , Focus & Concentration                                            /                                                                   TAKE                                         BY                                                 MOUTH   Cholecalciferol (VITAMIN D) 125 MCG (5000 UT) CAPS, Take 1 capsule Daily   diclofenac Sodium (VOLTAREN) 1 % GEL, Apply  2 grams   2 x /day  1 to Painful Joint   famotidine (PEPCID) 40 MG tablet, TAKE 1 TABLET BY MOUTH DAILY FOR ACID REFLUX AND INDIGESTION   gabapentin (NEURONTIN) 300 MG capsule, TAKE 1 CAPSULE BY MOUTH 3 TIMES  DAILY FOR PAIN   Multiple Vitamin (MULTIVITAMIN WITH MINERALS) TABS tablet, Take 1 tablet by mouth daily.   Probiotic Product (PROBIOTIC DAILY PO), Take by mouth daily.   VITAMIN K PO, Take by mouth daily.   LINZESS 72 MCG capsule, TAKE 1 CAPSULE BY MOUTH DAILY  BEFORE BREAKFAST (Patient not taking: Reported on 04/08/2023)  Current Problems (verified) Patient Active Problem List   Diagnosis Date Noted   Former smoker (30 pack/year, quit 2011) 02/07/2021   Labile hypertension 05/28/2020   Osteoarthritis of right AC (acromioclavicular) joint 01/18/2020   Os acromiale of right shoulder 01/18/2020   Carpal tunnel syndrome, left upper limb 01/18/2020   Aortic atherosclerosis (HCC) by Chest CT scan 01/07/2020 01/11/2020   Emphysema lung (HCC) 01/11/2020   Vitamin D deficiency 11/16/2015   Medication management 11/16/2015   Abnormal glucose 08/22/2014   Hyperlipidemia, mixed 10/05/2013   Rosacea    Anxiety    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HCC)    History of ischemic colitis     Screening Tests Immunization History  Administered Date(s) Administered   Influenza Split 05/17/2013, 05/17/2014   Influenza, High Dose Seasonal PF 05/09/2015, 06/18/2016, 05/30/2017, 06/01/2018, 08/02/2019   Influenza-Unspecified 06/03/2018, 05/27/2023   Moderna Sars-Covid-2 Vaccination 05/01/2020, 05/29/2020   PPD Test  05/04/2014   Pneumococcal Conjugate-13 05/09/2015   Pneumococcal Polysaccharide-23 10/04/2013   Td 08/30/2002   Tdap 04/07/2012   Varicella Zoster Immune Globulin 03/15/2016   Preventative care: Last colonoscopy: 12/2017- never had polyps, GI states for her to not have another- very tortuous she needed peds scope and  had a very difficult exam- will discuss other options in 10 years.   Last mammogram: 08/2022 Last pap smear/pelvic exam: 2014, remote hysterectomy, DONE DEXA: 03/2019 T -1.5; 08/2022 -1.5 (no change)  Prior vaccinations: TD or Tdap: 2013  Influenza: 05/2023 [facility] [place]  Pneumococcal: 09/2013 Prevnar13: 2016 Shingles/Zostavax: 2017, ask insurance about shingrix  Covid 19: 2/2, moderna, declines booster  Names of Other Physician/Practitioners you currently use: 1. [facility]- here for primary care 2. Dr. Marti Sleigh, eye doctor, last visit 2023, glasses, goes annually 3. Dr. Vincenza Hews, dentist, last visit 2024, goes q10m 4. Dr. Margo Aye, derm, last 2020, looking for new provider - follows for Rosacea   Patient Care Team: Lucky Cowboy, MD as PCP - General (Internal Medicine) Sharrell Ku, MD as Consulting Physician (Gastroenterology) Nita Sells, MD as Consulting Physician (Dermatology) Valeria Batman, MD (Inactive) as Consulting Physician (Orthopedic Surgery)  Allergies Allergies  Allergen Reactions   Doxycycline    Erythromycin    Flagyl [Metronidazole]    Penicillins    Sulfa Antibiotics     SURGICAL HISTORY She  has a past surgical history that includes Abdominal hysterectomy; Tubal ligation; Lumbar laminectomy (2000); Carpal tunnel release (Right, 2004); Appendectomy; Microdiscectomy lumbar; and Shoulder arthroscopy with rotator cuff repair (Right, 12/21/2020).   FAMILY HISTORY Her family history includes Alcohol abuse in her father; Cancer in her father; Diabetes in her father and sister; Heart disease in her  mother; Hypertension in her mother; Stroke in her mother.   SOCIAL HISTORY She  reports that she quit smoking about 14 years ago. Her smoking use included cigarettes. She started smoking about 44 years ago. She has a 30 pack-year smoking history. She has never used smokeless tobacco. She reports that she does not drink alcohol and does not use drugs.  MEDICARE WELLNESS OBJECTIVES: Physical activity: Current Exercise Habits: Home exercise routine Cardiac risk factors:   Depression/mood screen:      07/15/2023    4:46 PM  Depression screen PHQ 2/9  Decreased Interest 0  Down, Depressed, Hopeless 0  PHQ - 2 Score 0    ADLs:     07/15/2023    4:46 PM 09/26/2022   12:01 PM  In your present state of health, do you have any difficulty performing the following activities:  Hearing? 0 0  Vision? 0 0  Comment  Follows Dr. Marti Sleigh yearly  Difficulty concentrating or making decisions? 0 0  Walking or climbing stairs? 0 0  Dressing or bathing? 0 0  Doing errands, shopping? 0 0  Preparing Food and eating ?  N  Using the Toilet?  N  In the past six months, have you accidently leaked urine?  N  Do you have problems with loss of bowel control?  N  Managing your Medications?  N  Managing your Finances?  N  Housekeeping or managing your Housekeeping?  N     Cognitive Testing  Alert? Yes  Normal Appearance?Yes  Oriented to person? Yes  Place? Yes   Time? Yes  Recall of three objects?  Yes  Can perform simple calculations? Yes  Displays appropriate judgment?Yes  Can read the correct time from a watch face?Yes  EOL planning:      Review of Systems  Constitutional: Negative.  Negative for malaise/fatigue and weight loss.  HENT: Negative.  Negative for hearing loss and tinnitus.   Eyes: Negative.  Negative for blurred vision and double vision.  Respiratory: Negative.  Negative for cough, sputum production, shortness  of breath and wheezing.   Cardiovascular: Negative.  Negative for chest  pain, palpitations, orthopnea, claudication, leg swelling and PND.  Gastrointestinal: Negative.  Negative for abdominal pain, blood in stool, constipation, diarrhea, heartburn, melena, nausea and vomiting.  Genitourinary: Negative.   Musculoskeletal:  Positive for joint pain (R shoulder). Negative for falls and myalgias.  Skin: Negative.  Negative for rash.  Neurological: Negative.  Negative for dizziness, tingling, sensory change, weakness and headaches.  Endo/Heme/Allergies: Negative.  Negative for polydipsia.  Psychiatric/Behavioral: Negative.  Negative for depression, memory loss, substance abuse and suicidal ideas. The patient is not nervous/anxious and does not have insomnia.   All other systems reviewed and are negative.    Objective:   Today's Vitals   07/15/23 1505  BP: 130/70  Pulse: 66  Temp: 97.7 F (36.5 C)  SpO2: 98%  Weight: 144 lb (65.3 kg)  Height: 5\' 2"  (1.575 m)    General appearance: alert, no distress, WD/WN,  female HEENT: normocephalic, sclerae anicteric, TMs pearly, nares patent, no discharge or erythema, pharynx normal Oral cavity: MMM, no lesions Neck: supple, no lymphadenopathy, no thyromegaly, no masses Heart: RRR, normal S1, S2, systolic murmur with radiation to carotids and best heard at RSB Lungs: CTA bilaterally, no wheezes, rhonchi, or rales Abdomen: +bs, soft, non tender, non distended, no masses, no hepatomegaly, no splenomegaly Musculoskeletal: nontender, no swelling, no obvious deformity; R shoulder in sling Extremities: no edema, no cyanosis, no clubbing Pulses: 2+ symmetric, upper and lower extremities, normal cap refill Neurological: alert, oriented x 3, CN2-12 intact, strength normal upper extremities and lower extremities, sensation normal throughout, DTRs 2+ throughout, no cerebellar signs, gait normal Psychiatric: normal affect, behavior normal, pleasant    Medicare Attestation I have personally reviewed: The patient's medical and  social history Their use of alcohol, tobacco or illicit drugs Their current medications and supplements The patient's functional ability including ADLs,fall risks, home safety risks, cognitive, and hearing and visual impairment Diet and physical activities Evidence for depression or mood disorders  The patient's weight, height, BMI, and visual acuity have been recorded in the chart.  I have made referrals, counseling, and provided education to the patient based on review of the above and I have provided the patient with a written personalized care plan for preventive services.     Adela Glimpse, NP   07/15/2023

## 2023-07-15 NOTE — Patient Instructions (Signed)

## 2023-07-16 LAB — LIPID PANEL
Cholesterol: 190 mg/dL (ref <200)
HDL: 45 mg/dL — ABNORMAL LOW (ref >=50)
LDL Cholesterol (Calc): 97 mg/dL
Non-HDL Cholesterol (Calc): 145 mg/dL — ABNORMAL HIGH (ref <130)
Total CHOL/HDL Ratio: 4.2 (calc) (ref <5.0)
Triglycerides: 359 mg/dL — ABNORMAL HIGH (ref <150)

## 2023-07-16 LAB — URINALYSIS, ROUTINE W REFLEX MICROSCOPIC
Bacteria, UA: NONE SEEN /[HPF]
Bilirubin Urine: NEGATIVE
Glucose, UA: NEGATIVE
Hyaline Cast: NONE SEEN /[LPF]
Ketones, ur: NEGATIVE
Nitrite: NEGATIVE
Protein, ur: NEGATIVE
RBC / HPF: NONE SEEN /[HPF] (ref 0–2)
Specific Gravity, Urine: 1.01 (ref 1.001–1.035)
Squamous Epithelial / HPF: NONE SEEN /[HPF] (ref <=5)
pH: 6 (ref 5.0–8.0)

## 2023-07-16 LAB — CBC WITH DIFFERENTIAL/PLATELET
Absolute Lymphocytes: 2397 {cells}/uL (ref 850–3900)
Absolute Monocytes: 354 {cells}/uL (ref 200–950)
Basophils Absolute: 52 {cells}/uL (ref 0–200)
Basophils Relative: 1 %
Eosinophils Absolute: 421 {cells}/uL (ref 15–500)
Eosinophils Relative: 8.1 %
HCT: 38.1 % (ref 35.0–45.0)
Hemoglobin: 12.5 g/dL (ref 11.7–15.5)
MCH: 30.3 pg (ref 27.0–33.0)
MCHC: 32.8 g/dL (ref 32.0–36.0)
MCV: 92.3 fL (ref 80.0–100.0)
MPV: 9.8 fL (ref 7.5–12.5)
Monocytes Relative: 6.8 %
Neutro Abs: 1976 {cells}/uL (ref 1500–7800)
Neutrophils Relative %: 38 %
Platelets: 296 10*3/uL (ref 140–400)
RBC: 4.13 10*6/uL (ref 3.80–5.10)
RDW: 12.4 % (ref 11.0–15.0)
Total Lymphocyte: 46.1 %
WBC: 5.2 10*3/uL (ref 3.8–10.8)

## 2023-07-16 LAB — INSULIN, RANDOM: Insulin: 58.3 u[IU]/mL — ABNORMAL HIGH

## 2023-07-16 LAB — MAGNESIUM: Magnesium: 2.2 mg/dL (ref 1.5–2.5)

## 2023-07-16 LAB — COMPLETE METABOLIC PANEL WITH GFR
AG Ratio: 2.3 (calc) (ref 1.0–2.5)
ALT: 25 U/L (ref 6–29)
AST: 20 U/L (ref 10–35)
Albumin: 4.9 g/dL (ref 3.6–5.1)
Alkaline phosphatase (APISO): 65 U/L (ref 37–153)
BUN: 11 mg/dL (ref 7–25)
CO2: 32 mmol/L (ref 20–32)
Calcium: 9.8 mg/dL (ref 8.6–10.4)
Chloride: 105 mmol/L (ref 98–110)
Creat: 0.77 mg/dL (ref 0.60–1.00)
Globulin: 2.1 g/dL (ref 1.9–3.7)
Glucose, Bld: 113 mg/dL — ABNORMAL HIGH (ref 65–99)
Potassium: 3.9 mmol/L (ref 3.5–5.3)
Sodium: 143 mmol/L (ref 135–146)
Total Bilirubin: 0.5 mg/dL (ref 0.2–1.2)
Total Protein: 7 g/dL (ref 6.1–8.1)
eGFR: 81 mL/min/{1.73_m2} (ref >=60)

## 2023-07-16 LAB — HEMOGLOBIN A1C
Hgb A1c MFr Bld: 6 %{Hb} — ABNORMAL HIGH (ref <5.7)
Mean Plasma Glucose: 126 mg/dL
eAG (mmol/L): 7 mmol/L

## 2023-07-16 LAB — VITAMIN B12: Vitamin B-12: 1208 pg/mL — ABNORMAL HIGH (ref 200–1100)

## 2023-07-16 LAB — TSH: TSH: 2.71 m[IU]/L (ref 0.40–4.50)

## 2023-07-16 LAB — MICROALBUMIN / CREATININE URINE RATIO
Creatinine, Urine: 58 mg/dL (ref 20–275)
Microalb Creat Ratio: 7 mg/g{creat} (ref <30)
Microalb, Ur: 0.4 mg/dL

## 2023-07-16 LAB — VITAMIN D 25 HYDROXY (VIT D DEFICIENCY, FRACTURES): Vit D, 25-Hydroxy: 81 ng/mL (ref 30–100)

## 2023-09-03 ENCOUNTER — Encounter (HOSPITAL_BASED_OUTPATIENT_CLINIC_OR_DEPARTMENT_OTHER): Payer: Self-pay

## 2023-09-03 ENCOUNTER — Other Ambulatory Visit: Payer: Self-pay

## 2023-09-03 ENCOUNTER — Emergency Department (HOSPITAL_BASED_OUTPATIENT_CLINIC_OR_DEPARTMENT_OTHER)
Admission: EM | Admit: 2023-09-03 | Discharge: 2023-09-03 | Disposition: A | Discharge status: Home / Self Care | Payer: Medicare Other | Attending: Emergency Medicine | Admitting: Emergency Medicine

## 2023-09-03 ENCOUNTER — Emergency Department (HOSPITAL_BASED_OUTPATIENT_CLINIC_OR_DEPARTMENT_OTHER): Payer: Medicare Other

## 2023-09-03 ENCOUNTER — Other Ambulatory Visit (HOSPITAL_BASED_OUTPATIENT_CLINIC_OR_DEPARTMENT_OTHER): Payer: Self-pay

## 2023-09-03 DIAGNOSIS — J209 Acute bronchitis, unspecified: Secondary | ICD-10-CM | POA: Insufficient documentation

## 2023-09-03 DIAGNOSIS — Z79899 Other long term (current) drug therapy: Secondary | ICD-10-CM | POA: Insufficient documentation

## 2023-09-03 DIAGNOSIS — R062 Wheezing: Secondary | ICD-10-CM | POA: Diagnosis not present

## 2023-09-03 DIAGNOSIS — Z20822 Contact with and (suspected) exposure to covid-19: Secondary | ICD-10-CM | POA: Diagnosis not present

## 2023-09-03 DIAGNOSIS — R059 Cough, unspecified: Secondary | ICD-10-CM | POA: Diagnosis not present

## 2023-09-03 DIAGNOSIS — Z87891 Personal history of nicotine dependence: Secondary | ICD-10-CM | POA: Insufficient documentation

## 2023-09-03 DIAGNOSIS — R0602 Shortness of breath: Secondary | ICD-10-CM | POA: Diagnosis not present

## 2023-09-03 DIAGNOSIS — Z7982 Long term (current) use of aspirin: Secondary | ICD-10-CM | POA: Insufficient documentation

## 2023-09-03 LAB — RESP PANEL BY RT-PCR (RSV, FLU A&B, COVID)  RVPGX2
Influenza A by PCR: NEGATIVE
Influenza B by PCR: NEGATIVE
Resp Syncytial Virus by PCR: NEGATIVE
SARS Coronavirus 2 by RT PCR: NEGATIVE

## 2023-09-03 MED ORDER — PREDNISONE 50 MG PO TABS
60.0000 mg | ORAL_TABLET | Freq: Once | ORAL | Status: AC
  Administered 2023-09-03: 60 mg via ORAL
  Filled 2023-09-03: qty 1

## 2023-09-03 MED ORDER — PREDNISONE 20 MG PO TABS
40.0000 mg | ORAL_TABLET | Freq: Every day | ORAL | 0 refills | Status: AC
Start: 2023-09-03 — End: 2023-09-07
  Filled 2023-09-03: qty 8, 4d supply, fill #0

## 2023-09-03 MED ORDER — ALBUTEROL SULFATE HFA 108 (90 BASE) MCG/ACT IN AERS
2.0000 | INHALATION_SPRAY | Freq: Once | RESPIRATORY_TRACT | Status: AC
  Administered 2023-09-03: 2 via RESPIRATORY_TRACT
  Filled 2023-09-03: qty 6.7

## 2023-09-03 MED ORDER — ALBUTEROL SULFATE (2.5 MG/3ML) 0.083% IN NEBU
2.5000 mg | INHALATION_SOLUTION | Freq: Once | RESPIRATORY_TRACT | Status: AC
  Administered 2023-09-03: 2.5 mg via RESPIRATORY_TRACT
  Filled 2023-09-03: qty 3

## 2023-09-03 NOTE — ED Notes (Signed)
RT Note: Patient was complaining of having a tight chest. She feels like she could breath deep. A breathing treatment is being done

## 2023-09-03 NOTE — ED Provider Notes (Signed)
[facility] AT [facility] Provider Note   CSN: [id number] Arrival date & time: 09/03/23  1201     History  Chief Complaint  Patient presents with   Shortness of Breath    Kathryn Lamb is a 73 y.o. female.  Patient here with cough and congestion.  Has been having wheezing.  On and off cough for the last few weeks.  History of anxiety depression diverticulitis former smoker.  Been told that she has emphysema but does not use inhalers.  Denies any leg swelling chest pain weakness numbness tingling nausea vomiting diarrhea.  Nothing makes it worse or better.  Denies any fever.  The history is provided by the patient.       Home Medications Prior to Admission medications   Medication Sig Start Date End Date Taking? Authorizing Provider  aspirin EC 81 MG tablet Take 81 mg by mouth daily. Swallow whole.   Yes [provider]  atorvastatin (LIPITOR) 80 MG tablet TAKE 1 TABLET BY MOUTH DAILY FOR CHOLESTEROL 11/14/22  Yes Raynelle Dick, NP  buPROPion (WELLBUTRIN XL) 300 MG 24 hr tablet Take  1 tablet  Daily for Mood , Focus & Concentration                                            /                                                                   TAKE                                         BY                                                 MOUTH 03/27/23  Yes Lucky Cowboy, MD  cetirizine (ZYRTEC) 10 MG tablet Take 10 mg by mouth daily.   Yes [provider]  Cholecalciferol (VITAMIN D) 125 MCG (5000 UT) CAPS Take 1 capsule Daily 12/06/20  Yes Lucky Cowboy, MD  cyanocobalamin 1000 MCG tablet Take 1,000 mcg by mouth daily.   Yes [provider]  famotidine (PEPCID) 40 MG tablet TAKE 1 TABLET BY MOUTH DAILY FOR ACID REFLUX AND INDIGESTION 04/07/23  Yes Cranford, Tonya, NP  gabapentin (NEURONTIN) 300 MG capsule TAKE 1 CAPSULE BY MOUTH 3 TIMES  DAILY FOR PAIN 02/17/23  Yes Raynelle Dick, NP  Multiple Vitamin (MULTIVITAMIN WITH  MINERALS) TABS tablet Take 1 tablet by mouth daily.   Yes [provider]  predniSONE (DELTASONE) 20 MG tablet Take 2 tablets (40 mg total) by mouth daily for 4 days. 09/03/23 09/07/23 Yes Kinda Pottle, DO  Probiotic Product (PROBIOTIC DAILY PO) Take by mouth daily.   Yes [provider]  VITAMIN K PO Take by mouth daily.   Yes [provider]  LINZESS 72 MCG capsule TAKE 1 CAPSULE BY MOUTH  DAILY  BEFORE BREAKFAST Patient not taking: Reported on 04/08/2023 10/16/22   Raynelle Dick, NP      Allergies    Doxycycline, Erythromycin, Flagyl [metronidazole], Penicillins, and Sulfa antibiotics    Review of Systems   Review of Systems  Physical Exam Updated Vital Signs BP (!) 161/76   Pulse 72   Temp 98.6 F (37 C) (Oral)   Resp (!) 21   Ht 5\' 2"  (1.575 m)   Wt 64 kg   SpO2 95%   BMI 25.79 kg/m  Physical Exam Vitals and nursing note reviewed.  Constitutional:      General: She is not in acute distress.    Appearance: She is well-developed.  HENT:     Head: Normocephalic and atraumatic.  Eyes:     Conjunctiva/sclera: Conjunctivae normal.     Pupils: Pupils are equal, round, and reactive to light.  Cardiovascular:     Rate and Rhythm: Normal rate and regular rhythm.     Pulses: Normal pulses.     Heart sounds: Normal heart sounds. No murmur heard. Pulmonary:     Effort: Pulmonary effort is normal. No respiratory distress.     Breath sounds: Wheezing present. No decreased breath sounds.  Abdominal:     Palpations: Abdomen is soft.     Tenderness: There is no abdominal tenderness.  Musculoskeletal:        General: No swelling.     Cervical back: Normal range of motion and neck supple.     Right lower leg: No edema.     Left lower leg: No edema.  Skin:    General: Skin is warm and dry.     Capillary Refill: Capillary refill takes less than 2 seconds.  Neurological:     Mental Status: She is alert.  Psychiatric:        Mood and Affect: Mood  normal.     ED Results / Procedures / Treatments   Labs (all labs ordered are listed, but only abnormal results are displayed) Labs Reviewed  RESP PANEL BY RT-PCR (RSV, FLU A&B, COVID)  [id number]    EKG EKG Interpretation Date/Time:  Wednesday September 03 2023 12:57:00 EST Ventricular Rate:  71 PR Interval:  168 QRS Duration:  99 QT Interval:  387 QTC Calculation: 421 R Axis:   43  Text Interpretation: Sinus rhythm Low voltage, precordial leads Confirmed by Virgina Norfolk [phone]) on 09/03/2023 1:08:33 PM  Radiology DG Chest Portable 1 View Result Date: 09/03/2023 CLINICAL DATA:  Cough, shortness of breath, and wheezing. EXAM: PORTABLE CHEST 1 VIEW COMPARISON:  CT chest dated March 08, 2022. Chest x-ray dated April 09, 2012. FINDINGS: The heart size and mediastinal contours are within normal limits. Both lungs are clear. The visualized skeletal structures are unremarkable. IMPRESSION: No active disease. Electronically Signed   By: Obie Dredge M.D.   On: 09/03/2023 12:50    Procedures Procedures    Medications Ordered in ED Medications  albuterol (VENTOLIN HFA) 108 (90 Base) MCG/ACT inhaler 2 puff (has no administration in time range)  albuterol (PROVENTIL) (2.5 MG/3ML) 0.083% nebulizer solution 2.5 mg (2.5 mg Nebulization Given 09/03/23 1227)  predniSONE (DELTASONE) tablet 60 mg (60 mg Oral Given 09/03/23 1255)    ED Course/ Medical Decision Making/ A&P                                 Medical Decision Making Amount and/or Complexity of  Data Reviewed Radiology: ordered.  Risk Prescription drug management.   Scheryl Darter is here with cough congestion.  Unremarkable vitals.  No fever.  Normal work of breathing.  Mild wheezing on exam.  Former smoker.  Been told she has emphysema but does not take inhalers.  She has no chest pain weakness numbness tingling.  Differential diagnosis likely bronchitis versus pneumonia likely from viral process.  I have no concern for ACS PE  other emergent cardiac or pulmonary process.  Will get chest x-ray COVID and flu test.  Will get EKG.  Will give breathing treatment and prednisone.  Chest x-ray per my review and interpretation shows no evidence of pneumonia pneumothorax.  EKG shows sinus rhythm.  No ischemic changes.  COVID flu RSV test negative.  Overall have no concern for ACS PE or other acute process.  I do think that she has bronchitis.  Will treat with prednisone.  Will treat with albuterol.  Discharged in good condition.  Understands return precautions.  This chart was dictated using voice recognition software.  Despite best efforts to proofread,  errors can occur which can change the documentation meaning.         Final Clinical Impression(s) / ED Diagnoses Final diagnoses:  Acute bronchitis, unspecified organism    Rx / DC Orders ED Discharge Orders          Ordered    predniSONE (DELTASONE) 20 MG tablet  Daily        09/03/23 1328              [place], DO 09/03/23 1329

## 2023-09-03 NOTE — Discharge Instructions (Addendum)
Used two puff of inhaler every 4-6 hours as needed.  Take next dose of prednisone tomorrow.  Follow-up with your primary care doctor.

## 2023-09-03 NOTE — ED Triage Notes (Signed)
In for eval of SOB, cough, and wheezing. Initially started cough the week of Christmas.

## 2023-09-09 ENCOUNTER — Ambulatory Visit (INDEPENDENT_AMBULATORY_CARE_PROVIDER_SITE_OTHER): Payer: Medicare Other | Admitting: Nurse Practitioner

## 2023-09-09 VITALS — BP 150/80 | HR 98 | Temp 98.0°F | Resp 16 | Ht 62.0 in | Wt 144.4 lb

## 2023-09-09 DIAGNOSIS — J208 Acute bronchitis due to other specified organisms: Secondary | ICD-10-CM | POA: Diagnosis not present

## 2023-09-09 DIAGNOSIS — Z09 Encounter for follow-up examination after completed treatment for conditions other than malignant neoplasm: Secondary | ICD-10-CM | POA: Diagnosis not present

## 2023-09-09 DIAGNOSIS — R0602 Shortness of breath: Secondary | ICD-10-CM

## 2023-09-09 NOTE — Progress Notes (Signed)
Hospital follow up  Assessment and Plan: Hospital visit follow up for:   1. Hospital discharge follow-up (Primary) Reviewed discharge instructions in full including medication changes, diagnostics, labs, and future follow ups appointment. All questions and concerns addressed.    2. Acute bronchitis due to other specified organisms Resolved Continue Albuterol PRN If s/s persists discussed maintenance inhaler may be overall beneficial due to hx of emphysema  3. Shortness of breath Resolved   All medications were reviewed with patient and fully reconciled. All questions answered fully, and patient and family members were encouraged to call the office with any further questions or concerns. Discussed goal to avoid readmission related to this diagnosis.   Over 30 minutes of exam, counseling, chart review, and complex, high/moderate level critical decision making was performed this visit.   Future Appointments  Date Time Provider Department Center  10/22/2023 11:30 AM Adela Glimpse, NP [facility] None  01/26/2024 11:00 AM Lucky Cowboy, MD [facility] None  08/03/2024  3:00 PM Lucky Cowboy, MD [facility] None     HPI 73 y.o.female presents for follow up for transition from recent hospitalization or [facility]IF stay. Admit date to the hospital was 09/03/23, patient was discharged from the hospital on 09/03/23 and our clinical staff contacted the office the day after discharge to set up a follow up appointment. The discharge summary, medications, and diagnostic test results were reviewed before meeting with the patient. The patient was admitted for presentation of   Cough and congestion.  Had been having wheezing.  On and off cough for the last few weeks.  History of anxiety depression diverticulitis former smoker. Has emphysema but does not use inhalers.  Denied any leg swelling chest pain weakness numbness tingling nausea vomiting diarrhea.  Nothing made it worse or better.  Denied any  fever.  Overall she reports feeling well today after using inhaler.  Symptoms have resolved and she reports feeing back to baseline.    Home health is not involved.   Images while in the hospital: DG Chest Portable 1 View Result Date: 09/03/2023 CLINICAL DATA:  Cough, shortness of breath, and wheezing. EXAM: PORTABLE CHEST 1 VIEW COMPARISON:  CT chest dated March 08, 2022. Chest x-ray dated April 09, 2012. FINDINGS: The heart size and mediastinal contours are within normal limits. Both lungs are clear. The visualized skeletal structures are unremarkable. IMPRESSION: No active disease. Electronically Signed   By: Obie Dredge M.D.   On: 09/03/2023 12:50      Current Outpatient Medications (Cardiovascular):    atorvastatin (LIPITOR) 80 MG tablet, TAKE 1 TABLET BY MOUTH DAILY FOR CHOLESTEROL  Current Outpatient Medications (Respiratory):    cetirizine (ZYRTEC) 10 MG tablet, Take 10 mg by mouth daily.  Current Outpatient Medications (Analgesics):    aspirin EC 81 MG tablet, Take 81 mg by mouth daily. Swallow whole.  Current Outpatient Medications (Hematological):    cyanocobalamin 1000 MCG tablet, Take 1,000 mcg by mouth daily.  Current Outpatient Medications (Other):    buPROPion (WELLBUTRIN XL) 300 MG 24 hr tablet, Take  1 tablet  Daily for Mood , Focus & Concentration                                            /  TAKE                                         BY                                                 MOUTH   Cholecalciferol (VITAMIN D) 125 MCG (5000 UT) CAPS, Take 1 capsule Daily   famotidine (PEPCID) 40 MG tablet, TAKE 1 TABLET BY MOUTH DAILY FOR ACID REFLUX AND INDIGESTION   gabapentin (NEURONTIN) 300 MG capsule, TAKE 1 CAPSULE BY MOUTH 3 TIMES  DAILY FOR PAIN   Multiple Vitamin (MULTIVITAMIN WITH MINERALS) TABS tablet, Take 1 tablet by mouth daily.   Probiotic Product (PROBIOTIC DAILY PO), Take by mouth  daily.   VITAMIN K PO, Take by mouth daily.   LINZESS 72 MCG capsule, TAKE 1 CAPSULE BY MOUTH DAILY  BEFORE BREAKFAST (Patient not taking: Reported on 09/09/2023)  Past Medical History:  Diagnosis Date   Anxiety    Complete tear of right rotator cuff 01/18/2021   Depression    Diverticulitis    Ischemic colitis (HCC)    Mixed hyperlipidemia 10/05/2013   Rosacea      Allergies  Allergen Reactions   Doxycycline    Erythromycin    Flagyl [Metronidazole]    Penicillins    Sulfa Antibiotics     ROS: all negative except above.   Physical Exam: Filed Weights   09/09/23 1605  Weight: 144 lb 6.4 oz (65.5 kg)   BP (!) 150/80   Pulse 98   Temp 98 F (36.7 C)   Resp 16   Ht 5\' 2"  (1.575 m)   Wt 144 lb 6.4 oz (65.5 kg)   SpO2 97%   BMI 26.41 kg/m  General Appearance: Well nourished, in no apparent distress. Eyes: PERRLA, EOMs, conjunctiva no swelling or erythema Sinuses: No Frontal/maxillary tenderness ENT/Mouth: Ext aud canals clear, TMs without erythema, bulging. No erythema, swelling, or exudate on post pharynx.  Tonsils not swollen or erythematous. Hearing normal.  Neck: Supple, thyroid normal.  Respiratory: Respiratory effort normal, BS equal bilaterally without rales, rhonchi, wheezing or stridor.  Cardio: RRR with no MRGs. Brisk peripheral pulses without edema.  Abdomen: Soft, + BS.  Non tender, no guarding, rebound, hernias, masses. Lymphatics: Non tender without lymphadenopathy.  Musculoskeletal: Full ROM, 5/5 strength, normal gait.  Skin: Warm, dry without rashes, lesions, ecchymosis.  Neuro: Cranial nerves intact. Normal muscle tone, no cerebellar symptoms. Sensation intact.  Psych: Awake and oriented X 3, normal affect, Insight and Judgment appropriate.     Adela Glimpse, NP 4:33 PM [facility]

## 2023-09-14 ENCOUNTER — Encounter: Payer: Self-pay | Admitting: Nurse Practitioner

## 2023-09-14 NOTE — Patient Instructions (Signed)
 Albuterol; Budesonide Metered Dose Inhaler (MDI) What is this medication? ALBUTEROL; BUDESONIDE (al BYOO ter ole; bue DES oh nide) prevents and treats the symptoms of asthma. It works by opening the airways of the lungs, making it easier to breathe. It is a combination of a bronchodilator and an inhaled steroid. It is often called a rescue or quick-relief medication. This medicine may be used for other purposes; ask your health care provider or pharmacist if you have questions. COMMON BRAND NAME(S): AIRSUPRA What should I tell my care team before I take this medication? They need to know if you have any of these conditions: Diabetes (high blood sugar) Glaucoma Heart disease High blood pressure Immune system problems Infection, especially a viral infection such as chickenpox, cold sores, or herpes Injury of mouth or throat Irregular heartbeat or rhythm Osteoporosis, weak bones Pheochromocytoma Receiving steroids like dexamethasone or prednisone Recent surgery Seizures Thyroid disease An unusual or allergic reaction to albuterol, budesonide, steroids, other medications, foods, dyes, or preservatives Pregnant or trying to get pregnant Breast-feeding How should I use this medication? This medication is inhaled through the mouth. Shake well before using. Rinse your mouth with water after use. Make sure not to swallow the water. Take it as directed on the prescription label. Do not use it more often than directed. Keep taking it unless your care team tells you to stop. This medication comes with INSTRUCTIONS FOR USE. Ask your pharmacist for directions on how to use this medication. Read the information carefully. Talk to your pharmacist or care team if you have questions. Talk to your care team about the use of this medication in children. Special care may be needed. Overdosage: If you think you have taken too much of this medicine contact a poison control center or emergency room at  once. NOTE: This medicine is only for you. Do not share this medicine with others. What if I miss a dose? This does not apply. This medication is not for regular use. It should only be used as needed. What may interact with this medication? Certain antibiotics such as clarithromycin, telithromycin Certain antivirals for HIV or hepatitis Certain medications for blood pressure, heart disease, irregular heartbeat Certain medications for depression, anxiety, or other mental health conditions Certain medications for fungal infections such as itraconazole and ketoconazole Diuretics MAOIs, such as Marplan, Nardil, and Parnate This list may not describe all possible interactions. Give your health care provider a list of all the medicines, herbs, non-prescription drugs, or dietary supplements you use. Also tell them if you smoke, drink alcohol, or use illegal drugs. Some items may interact with your medicine. What should I watch for while using this medication? Visit your care team for regular checks on your progress. Tell your care team if your symptoms do not start to get better or if they get worse. If your symptoms get worse or if you are using this medication more than normal, call your care team right away. You and your care team should develop an Asthma Action Plan that is just for you. Be sure to know what to do if you are in the yellow (asthma is getting worse) or red (medical alert) zones. Your mouth may get dry. Chewing sugarless gum or sucking hard candy and drinking plenty of water may help. Contact your care team if the problem does not go away or is severe. This medication may increase your risk of getting an infection. Call your care team for advice if you get a fever, chills,  or sore throat, or other symptoms of a cold or flu. Do not treat yourself. Try to avoid being around people who are sick. What side effects may I notice from receiving this medication? Side effects that you should  report to your care team as soon as possible: Allergic reactions--skin rash, itching, hives, swelling of the face, lips, tongue, or throat Heart rhythm changes--fast or irregular heartbeat, dizziness, feeling faint or lightheaded, chest pain, trouble breathing Increase in blood pressure Low adrenal gland function--nausea, vomiting, loss of appetite, unusual weakness or fatigue, dizziness Muscle pain or cramps Sudden eye pain or change in vision such as blurry vision, seeing halos around lights, vision loss Thrush--white patches in the mouth Wheezing or trouble breathing that is worse after use Side effects that usually do not require medical attention (report to your care team if they continue or are bothersome): Change in taste Cough Dry mouth Headache Hoarseness Sore throat Tremors or shaking This list may not describe all possible side effects. Call your doctor for medical advice about side effects. You may report side effects to FDA at 1-800-FDA-1088. Where should I keep my medication? Keep out of the reach of children and pets. Store at room temperature between 20 and 25 degrees C (68 and 77 degrees F). Protect from light and moisture. Keep inhaler away from extreme heat. Get rid of it 12 months after removing it from the foil pouch, when the dose counter reads "0" or after the expiration date, whichever is first. To get rid of medications that are no longer needed or have expired: Take the medication to a medication take-back program. Check with your pharmacy or law enforcement to find a location. If you cannot return the medication, ask your care team how to get rid of this medication safely. NOTE: This sheet is a summary. It may not cover all possible information. If you have questions about this medicine, talk to your doctor, pharmacist, or health care provider.  2024 Elsevier/Gold Standard (2022-06-21 00:00:00)

## 2023-09-17 ENCOUNTER — Encounter: Payer: Self-pay | Admitting: Nurse Practitioner

## 2023-09-23 ENCOUNTER — Encounter: Payer: Self-pay | Admitting: Internal Medicine

## 2023-09-29 ENCOUNTER — Ambulatory Visit: Payer: Self-pay | Admitting: *Deleted

## 2023-09-29 NOTE — Telephone Encounter (Signed)
Message from [place] S sent at 09/29/2023 11:02 AM EST  Summary: Bronchitis- COPD request for Trelegy inhaler?   The patient called in stating she was a pt of Dr Oneta Rack and she had to be seen at [facility] recently and was diagnosed with Bronchitis. She said this further exacerbated her COPD. She said she was given a sample of Breztri and the inhaler did seem to help. She states her brother uses Trelegy and she states he says it really helps him and is easier to use. Plus she says he only has to use it once a day and she said the Markus Daft is twice a day. .  She is requesting for a prescription for the Trelegy to be sent in to  [facility], [place] - [address] Phone: [phone] Fax: [phone]   Please assist patient further as she doesn't have a New pt appt with her new provide until the beginning of April         Reason for Disposition  [1] Caller has URGENT medicine question about med that PCP or specialist prescribed AND [2] triager unable to answer question    Referred to urgent care  Answer Assessment - Initial Assessment Questions 1. NAME of MEDICINE: "What medicine(s) are you calling about?"     Trelegy  2. QUESTION: "What is your question?" (e.g., double dose of medicine, side effect)     I want this in place of the [facility].    I had to go to the ED same week Dr. Oneta Rack died.    I was prescribed the Breztri but I'm going to need something before I see my new PCP in April. 3. PRESCRIBER: "Who prescribed the medicine?" Reason: if prescribed by specialist, call should be referred to that group.     Provider at the [facility] location ED. 4. SYMPTOMS: "Do you have any symptoms?" If Yes, ask: "What symptoms are you having?"  "How bad are the symptoms (e.g., mild, moderate, severe)     I was diagnosed with bronchitis. 5. PREGNANCY:  "Is there any chance that you are pregnant?" "When was your last menstrual period?"     N/A due to  age  Protocols used: Medication Question Call-A-AH

## 2023-09-29 NOTE — Telephone Encounter (Signed)
  Chief Complaint: Wanting Trelegy instead of Breztri that she was prescribed at the [facility] location recently for bronchitis.   Symptoms: Has COPD and came down with bronchitis making COPD worse.   Former pt of Dr. Oneta Rack who recently died.    Frequency: N/A Pertinent Negatives: Patient denies said the Breztri helped but would rather have Trelegy.   Disposition: [] ED /[x] Urgent Care (no appt availability in office) / [] Appointment(In office/virtual)/ []  [place]/ [] Home Care/ [] Refused Recommended Disposition /[]  Mobile Bus/ []  Follow-up with PCP Additional Notes: referred to the urgent care since her new pt appt is not until April 2025.

## 2023-10-08 ENCOUNTER — Telehealth (HOSPITAL_BASED_OUTPATIENT_CLINIC_OR_DEPARTMENT_OTHER): Payer: Self-pay | Admitting: *Deleted

## 2023-10-08 NOTE — Telephone Encounter (Signed)
 Copied from CRM [id number]. Topic: Clinical - Prescription Issue >> Oct 08, 2023 11:21 AM Bobbye Morton wrote: Reason for CRM: Pt was admitted into the ER on 01/22 and was given the sample of medication BREZTRI on 01/28 for her symptoms. Pt is now needing another sample but Dr. Oneta Rack Office is now closed and her next appt is on 04/02, which is a little far out. Pt would like to know if she can be seen sooner, can she have another sample from the [facility] office, or can she be prescribed the medication to pick up at the pharmacy. Pt would like a follow up call [phone]

## 2023-10-08 NOTE — Telephone Encounter (Signed)
 We do not have any sooner appointments than pt's current scheduled appointment.  Kathryn Lamb, please advise on what you recommend.

## 2023-10-09 NOTE — Telephone Encounter (Signed)
 Pt's former PCP Dr. Oneta Rack passed away. Mychart message was sent to pt 2/10 about this stating that the two NPs that were working with Dr. Oneta Rack will no longer be able to provide primary care services within the office.

## 2023-10-09 NOTE — Telephone Encounter (Addendum)
 Called and spoke with pt letting her know that we unfortunately could not send a sample nor send a prescription for the Breztri inhaler to the pharmacy for her since we had not seen her for a pt yet. Pt verbalized understanding.nothing further needed.

## 2023-10-22 ENCOUNTER — Ambulatory Visit: Payer: Medicare Other | Admitting: Nurse Practitioner

## 2023-11-05 ENCOUNTER — Ambulatory Visit (INDEPENDENT_AMBULATORY_CARE_PROVIDER_SITE_OTHER): Admitting: Family Medicine

## 2023-11-05 ENCOUNTER — Encounter (HOSPITAL_BASED_OUTPATIENT_CLINIC_OR_DEPARTMENT_OTHER): Payer: Self-pay | Admitting: Family Medicine

## 2023-11-05 VITALS — BP 136/78 | HR 65 | Ht 62.0 in | Wt 144.4 lb

## 2023-11-05 DIAGNOSIS — Z23 Encounter for immunization: Secondary | ICD-10-CM | POA: Diagnosis not present

## 2023-11-05 DIAGNOSIS — Z1231 Encounter for screening mammogram for malignant neoplasm of breast: Secondary | ICD-10-CM | POA: Diagnosis not present

## 2023-11-05 DIAGNOSIS — J439 Emphysema, unspecified: Secondary | ICD-10-CM | POA: Diagnosis not present

## 2023-11-05 DIAGNOSIS — Z122 Encounter for screening for malignant neoplasm of respiratory organs: Secondary | ICD-10-CM

## 2023-11-05 DIAGNOSIS — Z7689 Persons encountering health services in other specified circumstances: Secondary | ICD-10-CM | POA: Diagnosis not present

## 2023-11-05 MED ORDER — TRELEGY ELLIPTA 100-62.5-25 MCG/ACT IN AEPB
1.0000 | INHALATION_SPRAY | Freq: Every day | RESPIRATORY_TRACT | 6 refills | Status: DC
Start: 2023-11-05 — End: 2024-03-03

## 2023-11-05 MED ORDER — ALBUTEROL SULFATE HFA 108 (90 BASE) MCG/ACT IN AERS
2.0000 | INHALATION_SPRAY | Freq: Four times a day (QID) | RESPIRATORY_TRACT | 5 refills | Status: AC | PRN
Start: 2023-11-05 — End: ?

## 2023-11-05 NOTE — Progress Notes (Deleted)
 Patient is in office today for a nurse visit for Immunization. Patient Injection was given in the  Left deltoid. Patient tolerated injection well.[email or web address]

## 2023-11-05 NOTE — Progress Notes (Signed)
 New Patient Office Visit  Subjective:   Kathryn Lamb Jul 17, 1951 11/05/2023  Chief Complaint  Patient presents with   Establish Care    Establishing care, needs rx for inhalor, she was in between providers and couldn't get the prescription     HPI: Kathryn Lamb is a 73 year old female with history of elevated triglycerides, prediabetes, emphysema and anxiety who presents today to establish care at [facility]. Introduced to Publishing rights manager role and practice setting.  All questions answered.   Last PCP: Adela Glimpse, NP  Concerns: See below    COPD: Scheryl Darter presents for the medical management of COPD. Patient has a hx of emphysema. She had a recent flare of acute bronchitis.  Patient was provided [facility] in January at the hospital discharge follow-up with her prior PCP for control of emphysema as patient was a former smoker.  She reports unable to afford Breztri and would like to switch to Trelegy.  She does report significant relief of symptoms with use of inhaler and only uses her albuterol inhaler as a rescue when needed for shortness of breath or wheezing.  Patient does qualify for CT lung cancer screening and has been performing these annually.  Last CT lung cancer screening was in 2023 per chart review.    Pneumovax: Not up to Date Influenza: Up to Date COVID-19: Not up to Date    The following portions of the patient's history were reviewed and updated as appropriate: past medical history, past surgical history, family history, social history, allergies, medications, and problem list.   Patient Active Problem List   Diagnosis Date Noted   Former smoker (30 pack/year, quit 2011) 02/07/2021   Labile hypertension 05/28/2020   Osteoarthritis of right AC (acromioclavicular) joint 01/18/2020   Os acromiale of right shoulder 01/18/2020   Carpal tunnel syndrome, left upper limb 01/18/2020   Aortic  atherosclerosis (HCC) by Chest CT scan 01/07/2020 01/11/2020   Emphysema lung (HCC) 01/11/2020   Diverticulosis large intestine w/o perforation or abscess w/o bleeding 03/13/2018   Vitamin D deficiency 11/16/2015   Medication management 11/16/2015   Abnormal glucose 08/22/2014   Hyperlipidemia, mixed 10/05/2013   Rosacea    Anxiety    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HCC)    History of ischemic colitis    Past Medical History:  Diagnosis Date   Anxiety    Complete tear of right rotator cuff 01/18/2021   Depression    Diverticulitis    Ischemic colitis (HCC)    Mixed hyperlipidemia 10/05/2013   Rosacea    Past Surgical History:  Procedure Laterality Date   ABDOMINAL HYSTERECTOMY     APPENDECTOMY     CARPAL TUNNEL RELEASE Right 2004   LUMBAR LAMINECTOMY  2000   MICRODISCECTOMY LUMBAR     SHOULDER ARTHROSCOPY WITH ROTATOR CUFF REPAIR Right 12/21/2020   Dr. Cleophas Dunker   TUBAL LIGATION     Family History  Problem Relation Age of Onset   Diabetes Father    Alcohol abuse Father    Cancer Father        prostate, lung   Hypertension Mother    Stroke Mother    Heart disease Mother    Diabetes Sister    Social History   Socioeconomic History   Marital status: Married    Spouse name: Not on file   Number of children: Not on file   Years of education: Not on file  Highest education level: 12th grade  Occupational History   Not on file  Tobacco Use   Smoking status: Former    Current packs/day: 0.00    Average packs/day: 1 pack/day for 30.0 years (30.0 ttl pk-yrs)    Types: Cigarettes    Start date: 10/04/1978    Quit date: 10/04/2008    Years since quitting: 15.0    Passive exposure: Past   Smokeless tobacco: Never  Vaping Use   Vaping status: Never Used  Substance and Sexual Activity   Alcohol use: No   Drug use: No   Sexual activity: Not on file  Other Topics Concern   Not on file  Social History Narrative   Not on file   Social Drivers of Health    Financial Resource Strain: Low Risk  (11/03/2023)   Overall Financial Resource Strain (CARDIA)    Difficulty of Paying Living Expenses: Not hard at all  Food Insecurity: No Food Insecurity (11/03/2023)   Hunger Vital Sign    Worried About Running Out of Food in the Last Year: Never true    Ran Out of Food in the Last Year: Never true  Transportation Needs: No Transportation Needs (11/03/2023)   PRAPARE - Administrator, Civil Service (Medical): No    Lack of Transportation (Non-Medical): No  Physical Activity: Unknown (11/03/2023)   Exercise Vital Sign    Days of Exercise per Week: 0 days    Minutes of Exercise per Session: Not on file  Stress: No Stress Concern Present (11/03/2023)   [employer] of Occupational Health - Occupational Stress Questionnaire    Feeling of Stress : Only a little  Social Connections: Moderately Integrated (11/03/2023)   Social Connection and Isolation Panel [NHANES]    Frequency of Communication with Friends and Family: Twice a week    Frequency of Social Gatherings with Friends and Family: Once a week    Attends Religious Services: 1 to 4 times per year    Active Member of [employer] or Organizations: No    Attends Engineer, structural: Not on file    Marital Status: Married  Catering manager Violence: Not on file   Outpatient Medications Prior to Visit  Medication Sig Dispense Refill   aspirin EC 81 MG tablet Take 81 mg by mouth daily. Swallow whole.     atorvastatin (LIPITOR) 80 MG tablet TAKE 1 TABLET BY MOUTH DAILY FOR CHOLESTEROL 100 tablet 2   buPROPion (WELLBUTRIN XL) 300 MG 24 hr tablet Take  1 tablet  Daily for Mood , Focus & Concentration                                            /                                                                   TAKE                                         BY  MOUTH 90 tablet 3   cetirizine (ZYRTEC) 10 MG tablet Take 10 mg by mouth daily.      Cholecalciferol (VITAMIN D) 125 MCG (5000 UT) CAPS Take 1 capsule Daily 1 capsule 0   cyanocobalamin 1000 MCG tablet Take 1,000 mcg by mouth daily.     famotidine (PEPCID) 40 MG tablet TAKE 1 TABLET BY MOUTH DAILY FOR ACID REFLUX AND INDIGESTION 100 tablet 2   gabapentin (NEURONTIN) 300 MG capsule TAKE 1 CAPSULE BY MOUTH 3 TIMES  DAILY FOR PAIN 300 capsule 2   Multiple Vitamin (MULTIVITAMIN WITH MINERALS) TABS tablet Take 1 tablet by mouth daily.     Probiotic Product (PROBIOTIC DAILY PO) Take by mouth daily.     VITAMIN K PO Take by mouth daily.     LINZESS 72 MCG capsule TAKE 1 CAPSULE BY MOUTH DAILY  BEFORE BREAKFAST 30 capsule 11   No facility-administered medications prior to visit.   Allergies  Allergen Reactions   Doxycycline    Erythromycin    Penicillins    Sulfa Antibiotics    Clindamycin Rash    C-diff    ROS: A complete ROS was performed with pertinent positives/negatives noted in the HPI. The remainder of the ROS are negative.   Objective:   Today's Vitals   11/05/23 1053 11/05/23 1120  BP: (!) 160/69 136/78  Pulse: 65   SpO2: 98%   Weight: 144 lb 6.4 oz (65.5 kg)   Height: 5\' 2"  (1.575 m)   PainSc: 0-No pain     GENERAL: Well-appearing, in NAD. Well nourished.  SKIN: Pink, warm and dry.  Head: Normocephalic. NECK: Trachea midline. Full ROM w/o pain or tenderness.  RESPIRATORY: Chest wall symmetrical. Respirations even and non-labored. Breath sounds clear to auscultation bilaterally.  CARDIAC: S1, S2 present, regular rate and rhythm without murmur or gallops. Peripheral pulses 2+ bilaterally.  MSK: Muscle tone and strength appropriate for age.   NEUROLOGIC: No motor or sensory deficits. Steady, even gait. C2-C12 intact.  PSYCH/MENTAL STATUS: Alert, oriented x 3. Cooperative, appropriate mood and affect.       Assessment & Plan:   1. Encounter to establish care with new doctor (Primary) Discussed need for updated evaluation of prediabetes, hyperlipidemia  and fasting lab work to be completed as this has been several months since this was performed.  Patient in agreement and will return in 6 to 8 weeks for fasting lab work and evaluation of chronic conditions.  2. Pulmonary emphysema, unspecified emphysema type (HCC) Currently controlled.  Will switch from [facility] to Trelegy and refill of albuterol inhaler to use as needed.  - Fluticasone-Umeclidin-Vilant (TRELEGY ELLIPTA) 100-62.5-25 MCG/ACT AEPB; Inhale 1 puff into the lungs daily.  Dispense: 1 each; Refill: 6 - albuterol (VENTOLIN HFA) 108 (90 Base) MCG/ACT inhaler; Inhale 2 puffs into the lungs every 6 (six) hours as needed for wheezing or shortness of breath.  Dispense: 8 g; Refill: 5  3. Immunization due Patient due for Prevnar 20.  Immunization given in office today. - Pneumococcal conjugate vaccine 20-valent (Prevnar 20)  4. Breast cancer screening by mammogram - MM 3D SCREENING MAMMOGRAM BILATERAL BREAST; Future  5. Encounter for screening for lung cancer Patient was given opportunity to ask questions and verbalizes agreement of risk and benefits of lung cancer screening and verbalized agreement to continue. - CT CHEST LUNG CA SCREEN LOW DOSE W/O CM; Future   Patient to reach out to office if new, worrisome, or unresolved symptoms arise or if no improvement in  patient's condition. Patient verbalized understanding and is agreeable to treatment plan. All questions answered to patient's satisfaction.   A total of 50 minutes were spent on this encounter today including face to face, ordering and reviewing labs, reviewing patient's previous records from Prior PCP, health maintenance, documenting in the record and ordering medications and screenings.    Return in about 6 weeks (around 12/17/2023) for Follow up Prediabetes, HLD, COPD (Labs fasting prior) .    Hilbert Bible, [place]

## 2023-11-12 ENCOUNTER — Ambulatory Visit (HOSPITAL_BASED_OUTPATIENT_CLINIC_OR_DEPARTMENT_OTHER): Payer: Medicare Other | Admitting: Family Medicine

## 2023-12-01 ENCOUNTER — Ambulatory Visit (HOSPITAL_BASED_OUTPATIENT_CLINIC_OR_DEPARTMENT_OTHER)
Admission: RE | Admit: 2023-12-01 | Discharge: 2023-12-01 | Disposition: A | Discharge status: Home / Self Care | Source: Ambulatory Visit | Attending: Family Medicine | Admitting: Family Medicine

## 2023-12-01 ENCOUNTER — Encounter (HOSPITAL_BASED_OUTPATIENT_CLINIC_OR_DEPARTMENT_OTHER): Payer: Self-pay | Admitting: Radiology

## 2023-12-01 DIAGNOSIS — Z122 Encounter for screening for malignant neoplasm of respiratory organs: Secondary | ICD-10-CM | POA: Diagnosis present

## 2023-12-01 DIAGNOSIS — J439 Emphysema, unspecified: Secondary | ICD-10-CM | POA: Insufficient documentation

## 2023-12-01 DIAGNOSIS — Z1231 Encounter for screening mammogram for malignant neoplasm of breast: Secondary | ICD-10-CM

## 2023-12-01 DIAGNOSIS — I700 Atherosclerosis of aorta: Secondary | ICD-10-CM | POA: Insufficient documentation

## 2023-12-01 DIAGNOSIS — Z87891 Personal history of nicotine dependence: Secondary | ICD-10-CM | POA: Diagnosis not present

## 2023-12-04 ENCOUNTER — Encounter (HOSPITAL_BASED_OUTPATIENT_CLINIC_OR_DEPARTMENT_OTHER): Payer: Self-pay | Admitting: Family Medicine

## 2023-12-04 NOTE — Progress Notes (Signed)
 Lynix,   Your mammogram results show no evidence of breast cancer. We will plan to repeat this in 1 year for routine screening.  If you should have concerns or changes in your breasts within the next year, please let us  know.   Sylvester Evert, FNP-C

## 2023-12-18 ENCOUNTER — Other Ambulatory Visit (HOSPITAL_COMMUNITY): Payer: Self-pay | Admitting: Family Medicine

## 2023-12-18 ENCOUNTER — Other Ambulatory Visit (HOSPITAL_BASED_OUTPATIENT_CLINIC_OR_DEPARTMENT_OTHER): Payer: Self-pay | Admitting: Family Medicine

## 2023-12-18 DIAGNOSIS — E782 Mixed hyperlipidemia: Secondary | ICD-10-CM | POA: Diagnosis not present

## 2023-12-18 DIAGNOSIS — R7303 Prediabetes: Secondary | ICD-10-CM | POA: Diagnosis not present

## 2023-12-19 ENCOUNTER — Ambulatory Visit (HOSPITAL_BASED_OUTPATIENT_CLINIC_OR_DEPARTMENT_OTHER): Admitting: Family Medicine

## 2023-12-19 ENCOUNTER — Encounter (HOSPITAL_BASED_OUTPATIENT_CLINIC_OR_DEPARTMENT_OTHER): Payer: Self-pay | Admitting: Family Medicine

## 2023-12-19 LAB — LIPID PANEL
Chol/HDL Ratio: 3.6 ratio (ref 0.0–4.4)
Cholesterol, Total: 186 mg/dL (ref 100–199)
HDL: 52 mg/dL (ref >39)
LDL Chol Calc (NIH): 111 mg/dL — ABNORMAL HIGH (ref 0–99)
Triglycerides: 127 mg/dL (ref 0–149)
VLDL Cholesterol Cal: 23 mg/dL (ref 5–40)

## 2023-12-19 LAB — HEMOGLOBIN A1C
Est. average glucose Bld gHb Est-mCnc: 126 mg/dL
Hgb A1c MFr Bld: 6 % — ABNORMAL HIGH (ref 4.8–5.6)

## 2023-12-19 LAB — BASIC METABOLIC PANEL WITH GFR
BUN/Creatinine Ratio: 10 — ABNORMAL LOW (ref 12–28)
BUN: 9 mg/dL (ref 8–27)
CO2: 24 mmol/L (ref 20–29)
Calcium: 9.9 mg/dL (ref 8.7–10.3)
Chloride: 101 mmol/L (ref 96–106)
Creatinine, Ser: 0.88 mg/dL (ref 0.57–1.00)
Glucose: 105 mg/dL — ABNORMAL HIGH (ref 70–99)
Potassium: 4.4 mmol/L (ref 3.5–5.2)
Sodium: 141 mmol/L (ref 134–144)
eGFR: 69 mL/min/{1.73_m2} (ref >59)

## 2023-12-19 NOTE — Progress Notes (Signed)
 Hi Purity, We will review your labs in the office as well next week.  Your A1c is well-controlled at 6.0.  Please continue good diet and exercise control.  Your cholesterol is also well-controlled and no dosage changes needed at this time.  Your kidney function and electrolytes are stable as well.

## 2023-12-23 ENCOUNTER — Encounter (HOSPITAL_BASED_OUTPATIENT_CLINIC_OR_DEPARTMENT_OTHER): Payer: Self-pay | Admitting: Family Medicine

## 2023-12-23 ENCOUNTER — Other Ambulatory Visit (HOSPITAL_BASED_OUTPATIENT_CLINIC_OR_DEPARTMENT_OTHER): Payer: Self-pay

## 2023-12-23 ENCOUNTER — Ambulatory Visit (INDEPENDENT_AMBULATORY_CARE_PROVIDER_SITE_OTHER): Admitting: Family Medicine

## 2023-12-23 VITALS — BP 127/69 | HR 66 | Ht 62.0 in | Wt 146.8 lb

## 2023-12-23 DIAGNOSIS — R238 Other skin changes: Secondary | ICD-10-CM | POA: Diagnosis not present

## 2023-12-23 DIAGNOSIS — J439 Emphysema, unspecified: Secondary | ICD-10-CM

## 2023-12-23 DIAGNOSIS — E782 Mixed hyperlipidemia: Secondary | ICD-10-CM

## 2023-12-23 DIAGNOSIS — R7303 Prediabetes: Secondary | ICD-10-CM

## 2023-12-23 MED ORDER — TRETINOIN 0.025 % EX CREA
TOPICAL_CREAM | Freq: Every day | CUTANEOUS | 2 refills | Status: DC
  Filled 2023-12-23 (×2): qty 45, 30d supply, fill #0

## 2023-12-23 NOTE — Progress Notes (Unsigned)
 Subjective:   Kathryn Lamb 1951/02/08 12/23/2023  Chief Complaint  Patient presents with   Medical Management of Chronic Issues    6-week follow up; denies any main concerns for today's visit.    HPI: Kathryn Lamb presents today for re-assessment and management of chronic medical conditions.  IMPAIRED FASTING GLUCOSE Kathryn Lamb is here for medical management of impaired fasting glucose.  Patient's current IFG medication regimen is: Diet, exercise Denies polydipsia, polyphagia, polyuria.   Lab Results  Component Value Date   HGBA1C 6.0 (H) 12/18/2023    COPD: Kathryn Lamb presents for the medical management of COPD.  Current medications: Trelegy (switched from Breztri due to cost) Oxygen use: no Rescue inhaler frequency: Very infrequent, rare Worsening cough or dyspnea: Denies.  Patient states with switch from Breztri to Trelegy she has noticed significant improvement in management of her COPD.  She denies any recent flares or symptoms of uncontrolled COPD. COPD status: controlled  Pneumovax: Up to Date Influenza: Up to Date COVID-19: unknown   HYPERLIPIDEMIA: Kathryn Lamb presents for the medical management of hyperlipidemia.  Patient's current HLD regimen is: Atorvastatin  80 mg daily Patient is  currently taking prescribed medications for HLD.  Adhering to heathy diet: Yes Exercising regularly: Yes  Denies myalgias.  Lab Results  Component Value Date   CHOL 186 12/18/2023   HDL 52 12/18/2023   LDLCALC 111 (H) 12/18/2023   TRIG 127 12/18/2023   CHOLHDL 3.6 12/18/2023   The 10-year ASCVD risk score (Arnett DK, et al., 2019) is: 14.2%    ROSACEA:  Patient reports hx of rosacea in the past and has been using metrogel for relief. She reports small bumps present to face ongoing despite metronidazole gel.  Reports these places have become "like acne" and will take several days to weeks to resolve.  She has not tried any over-the-counter topical  treatments.  She is denies changing any fragrance, soaps, lotions, foods, detergents.   The following portions of the patient's history were reviewed and updated as appropriate: past medical history, past surgical history, family history, social history, allergies, medications, and problem list.   Patient Active Problem List   Diagnosis Date Noted   Former smoker (30 pack/year, quit 2011) 02/07/2021   Labile hypertension 05/28/2020   Osteoarthritis of right AC (acromioclavicular) joint 01/18/2020   Os acromiale of right shoulder 01/18/2020   Carpal tunnel syndrome, left upper limb 01/18/2020   Aortic atherosclerosis (HCC) by Chest CT scan 01/07/2020 01/11/2020   Emphysema lung (HCC) 01/11/2020   Diverticulosis large intestine w/o perforation or abscess w/o bleeding 03/13/2018   Abnormal glucose 08/22/2014   Hyperlipidemia, mixed 10/05/2013   Rosacea    Anxiety    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HCC)    History of ischemic colitis    Past Medical History:  Diagnosis Date   Allergy    Anxiety    Arthritis    After surgerys   Cataract 15 yrs ago   Baby cataracts   Complete tear of right rotator cuff 01/18/2021   Depression    Diverticulitis    Emphysema of lung (HCC) 2021   Ct scan   GERD (gastroesophageal reflux disease) 2021   Ischemic colitis (HCC)    Mixed hyperlipidemia 10/05/2013   Neuromuscular disorder (HCC)    Osteoporosis    Osteopena   Rosacea    Past Surgical History:  Procedure Laterality Date   ABDOMINAL HYSTERECTOMY     APPENDECTOMY  CARPAL TUNNEL RELEASE Right 2004   LUMBAR LAMINECTOMY  2000   MICRODISCECTOMY LUMBAR     SHOULDER ARTHROSCOPY WITH ROTATOR CUFF REPAIR Right 12/21/2020   Dr. Aviva Lemmings   SPINE SURGERY     2 back surgeries   TUBAL LIGATION     Family History  Problem Relation Age of Onset   Diabetes Father    Alcohol abuse Father    Cancer Father        prostate, lung   Hypertension Mother    Stroke Mother    Heart disease  Mother    Diabetes Sister    Outpatient Medications Prior to Visit  Medication Sig Dispense Refill   albuterol  (VENTOLIN  HFA) 108 (90 Base) MCG/ACT inhaler Inhale 2 puffs into the lungs every 6 (six) hours as needed for wheezing or shortness of breath. 8 g 5   aspirin EC 81 MG tablet Take 81 mg by mouth daily. Swallow whole.     atorvastatin  (LIPITOR) 80 MG tablet TAKE 1 TABLET BY MOUTH DAILY FOR CHOLESTEROL 100 tablet 2   buPROPion  (WELLBUTRIN  XL) 300 MG 24 hr tablet Take  1 tablet  Daily for Mood , Focus & Concentration                                            /                                                                   TAKE                                         BY                                                 MOUTH 90 tablet 3   cetirizine (ZYRTEC) 10 MG tablet Take 10 mg by mouth daily.     Cholecalciferol (VITAMIN D ) 125 MCG (5000 UT) CAPS Take 1 capsule Daily 1 capsule 0   cyanocobalamin  1000 MCG tablet Take 1,000 mcg by mouth daily.     famotidine  (PEPCID ) 40 MG tablet TAKE 1 TABLET BY MOUTH DAILY FOR ACID REFLUX AND INDIGESTION 100 tablet 2   Fluticasone-Umeclidin-Vilant (TRELEGY ELLIPTA ) 100-62.5-25 MCG/ACT AEPB Inhale 1 puff into the lungs daily. 1 each 6   gabapentin  (NEURONTIN ) 300 MG capsule TAKE 1 CAPSULE BY MOUTH 3 TIMES  DAILY FOR PAIN 300 capsule 2   Multiple Vitamin (MULTIVITAMIN WITH MINERALS) TABS tablet Take 1 tablet by mouth daily.     Probiotic Product (PROBIOTIC DAILY PO) Take by mouth daily.     VITAMIN K PO Take by mouth daily.     No facility-administered medications prior to visit.   Allergies  Allergen Reactions   Doxycycline    Erythromycin    Penicillins    Sulfa Antibiotics    Clindamycin Rash    C-diff     ROS: A  complete ROS was performed with pertinent positives/negatives noted in the HPI. The remainder of the ROS are negative.    Objective:   Today's Vitals   12/23/23 1436  BP: 127/69  Pulse: 66  SpO2: 98%  Weight: 146 lb 12.8  oz (66.6 kg)  Height: 5\' 2"  (1.575 m)    Physical Exam          GENERAL: Well-appearing, in NAD. Well nourished.  SKIN: Pink, warm and dry.  Scattered pustule formation present to bilateral aspects of face. Head: Normocephalic. NECK: Trachea midline. Full ROM w/o pain or tenderness.  RESPIRATORY: Chest wall symmetrical. Respirations even and non-labored. Breath sounds clear to auscultation bilaterally.  CARDIAC: S1, S2 present, regular rate and rhythm without murmur or gallops. Peripheral pulses 2+ bilaterally.  MSK: Muscle tone and strength appropriate for age.  NEUROLOGIC: No motor or sensory deficits. Steady, even gait. C2-C12 intact.  PSYCH/MENTAL STATUS: Alert, oriented x 3. Cooperative, appropriate mood and affect.     Assessment & Plan:   1. Skin irritation (Primary) Patient does report changing her make-up/foundation recently and that may be contributing to acne formation.  Recommend patient change her foundation to something that is not as poor clogging/oil forming and use Retin-A cream topically at bedtime.  We discussed safe use of this cream and increasing use as needed/as tolerated.  Recommend wearing an SPF daily when using.  If no improvement in 2 to 3 weeks, reach out to PCP - tretinoin (RETIN-A) 0.025 % cream; Apply topically at bedtime. Decrease frequency of use if skin irritation occurs. Wear SPF daily while using.  Dispense: 45 g; Refill: 2  2. Prediabetes Well-controlled.  Continue current regimen of diet and exercise  3. Pulmonary emphysema, unspecified emphysema type (HCC) Well-controlled, continue Trelegy as directed.  4. Hyperlipidemia, mixed Well-controlled, continue atorvastatin  as directed, heart healthy diet and regular exercise.   Meds ordered this encounter  Medications   tretinoin (RETIN-A) 0.025 % cream    Sig: Apply topically at bedtime. Decrease frequency of use if skin irritation occurs. Wear SPF daily while using.    Dispense:  45 g     Refill:  2    Supervising Provider:   DE Peru, RAYMOND J [[id number]]   Lab Orders  No laboratory test(s) ordered today   Return in about 7 months (around 07/19/2024) for ANNUAL PHYSICAL, HLD , Prediabetes .    Patient to reach out to office if new, worrisome, or unresolved symptoms arise or if no improvement in patient's condition. Patient verbalized understanding and is agreeable to treatment plan. All questions answered to patient's satisfaction.    Nonda Bays, [place]

## 2023-12-23 NOTE — Patient Instructions (Addendum)
 Get your Shingrix Vaccine

## 2023-12-25 ENCOUNTER — Ambulatory Visit (HOSPITAL_BASED_OUTPATIENT_CLINIC_OR_DEPARTMENT_OTHER): Payer: Self-pay | Admitting: Family Medicine

## 2023-12-25 NOTE — Progress Notes (Signed)
 Hi Alacia,  Your CT Lung Cancer Study did not show worrisome findings and radiology has recommended we continue to screen yearly. There was a very small 2.5 millimeter nodule in the right upper lobe since last screening, but based on this size and appearance, no further imaging is needed at this time. We will plan to repeat this screening in 1 year. If you have further questions, please let me know.

## 2023-12-30 ENCOUNTER — Encounter (HOSPITAL_BASED_OUTPATIENT_CLINIC_OR_DEPARTMENT_OTHER): Payer: Self-pay | Admitting: Family Medicine

## 2023-12-30 ENCOUNTER — Ambulatory Visit: Payer: Medicare Other | Admitting: Nurse Practitioner

## 2023-12-30 NOTE — Telephone Encounter (Signed)
 Jon Gills, Please see mychart message sent by pt and advise.

## 2023-12-31 ENCOUNTER — Other Ambulatory Visit (HOSPITAL_BASED_OUTPATIENT_CLINIC_OR_DEPARTMENT_OTHER): Payer: Self-pay | Admitting: Family Medicine

## 2023-12-31 DIAGNOSIS — L719 Rosacea, unspecified: Secondary | ICD-10-CM

## 2023-12-31 MED ORDER — METRONIDAZOLE 0.75 % EX GEL: 1.0000 | Freq: Two times a day (BID) | CUTANEOUS | 3 refills | Status: AC

## 2024-01-15 ENCOUNTER — Other Ambulatory Visit (HOSPITAL_BASED_OUTPATIENT_CLINIC_OR_DEPARTMENT_OTHER): Payer: Self-pay | Admitting: *Deleted

## 2024-01-15 MED ORDER — ATORVASTATIN CALCIUM 80 MG PO TABS: ORAL_TABLET | ORAL | 3 refills | Status: AC

## 2024-01-19 ENCOUNTER — Encounter (HOSPITAL_BASED_OUTPATIENT_CLINIC_OR_DEPARTMENT_OTHER): Payer: Self-pay | Admitting: *Deleted

## 2024-01-26 ENCOUNTER — Ambulatory Visit: Payer: Medicare Other | Admitting: Internal Medicine

## 2024-02-19 ENCOUNTER — Encounter (HOSPITAL_BASED_OUTPATIENT_CLINIC_OR_DEPARTMENT_OTHER): Payer: Self-pay | Admitting: Family Medicine

## 2024-03-02 ENCOUNTER — Other Ambulatory Visit (HOSPITAL_BASED_OUTPATIENT_CLINIC_OR_DEPARTMENT_OTHER): Payer: Self-pay | Admitting: Family Medicine

## 2024-03-02 DIAGNOSIS — J439 Emphysema, unspecified: Secondary | ICD-10-CM

## 2024-03-03 ENCOUNTER — Ambulatory Visit (HOSPITAL_BASED_OUTPATIENT_CLINIC_OR_DEPARTMENT_OTHER): Admitting: *Deleted

## 2024-03-03 ENCOUNTER — Other Ambulatory Visit (HOSPITAL_BASED_OUTPATIENT_CLINIC_OR_DEPARTMENT_OTHER): Payer: Self-pay | Admitting: *Deleted

## 2024-03-03 ENCOUNTER — Encounter (HOSPITAL_BASED_OUTPATIENT_CLINIC_OR_DEPARTMENT_OTHER): Payer: Self-pay

## 2024-03-03 VITALS — BP 120/73

## 2024-03-03 DIAGNOSIS — Z0000 Encounter for general adult medical examination without abnormal findings: Secondary | ICD-10-CM | POA: Diagnosis not present

## 2024-03-03 MED ORDER — FAMOTIDINE 40 MG PO TABS: ORAL_TABLET | ORAL | 2 refills | Status: AC

## 2024-03-03 NOTE — Progress Notes (Signed)
 Subjective:   Kathryn Lamb is a 73 y.o. female who presents for Medicare Annual (Subsequent) preventive examination.  Visit Complete: Virtual I connected with  Kathryn Lamb on 03/03/24 by a audio enabled telemedicine application and verified that I am speaking with the correct person using two identifiers.  Patient Location: Home  Provider Location: Office/Clinic  I discussed the limitations of evaluation and management by telemedicine. The patient expressed understanding and agreed to proceed.  Vital Signs: Because this visit was a virtual/telehealth visit, some criteria may be missing or patient reported. Any vitals not documented were not able to be obtained and vitals that have been documented are patient reported.  Patient Medicare AWV questionnaire was completed by the patient on 03/03/24; I have confirmed that all information answered by patient is correct and no changes since this date.        Objective:    There were no vitals filed for this visit. There is no height or weight on file to calculate BMI.     09/03/2023   12:21 PM 02/07/2022   11:45 AM 02/07/2021   11:49 AM 01/02/2021    2:34 PM 11/03/2019    2:14 PM 11/03/2019    1:46 PM 09/09/2018    1:48 PM  Advanced Directives  Does Patient Have a Medical Advance Directive? Yes Yes No No No No No   Type of Advance Directive Living will;Healthcare Power of [employer] Power of [place];Living will       Does patient want to make changes to medical advance directive? No - Patient declined No - Patient declined     Yes (MAU/Ambulatory/Procedural Areas - Information given)   Copy of Healthcare Power of Attorney in Chart?  No - copy requested       Would patient like information on creating a medical advance directive?   Yes (MAU/Ambulatory/Procedural Areas - Information given) Yes (MAU/Ambulatory/Procedural Areas - Information given) No - Patient declined Yes (MAU/Ambulatory/Procedural Areas - Information given)       Data saved with a previous flowsheet row definition     Current Medications (verified) Outpatient Encounter Medications as of 03/03/2024  Medication Sig   albuterol  (VENTOLIN  HFA) 108 (90 Base) MCG/ACT inhaler Inhale 2 puffs into the lungs every 6 (six) hours as needed for wheezing or shortness of breath.   aspirin EC 81 MG tablet Take 81 mg by mouth daily. Swallow whole.   atorvastatin  (LIPITOR) 80 MG tablet TAKE 1 TABLET BY MOUTH  DAILY FOR CHOLESTEROL   buPROPion  (WELLBUTRIN  XL) 300 MG 24 hr tablet Take  1 tablet  Daily for Mood , Focus & Concentration                                            /                                                                   TAKE  BY                                                 MOUTH   cetirizine (ZYRTEC) 10 MG tablet Take 10 mg by mouth daily.   Cholecalciferol (VITAMIN D ) 125 MCG (5000 UT) CAPS Take 1 capsule Daily   cyanocobalamin  1000 MCG tablet Take 1,000 mcg by mouth daily.   famotidine  (PEPCID ) 40 MG tablet TAKE 1 TABLET BY MOUTH DAILY FOR ACID REFLUX AND INDIGESTION   gabapentin  (NEURONTIN ) 300 MG capsule TAKE 1 CAPSULE BY MOUTH 3 TIMES  DAILY FOR PAIN   metroNIDAZOLE  (METROGEL ) 0.75 % gel Apply 1 Application topically 2 (two) times daily. For rosacea   Multiple Vitamin (MULTIVITAMIN WITH MINERALS) TABS tablet Take 1 tablet by mouth daily.   Probiotic Product (PROBIOTIC DAILY PO) Take by mouth daily.   TRELEGY ELLIPTA  100-62.5-25 MCG/ACT AEPB USE 1 INHALATION BY MOUTH DAILY   tretinoin  (RETIN-A ) 0.025 % cream Apply topically at bedtime. Decrease frequency of use if skin irritation occurs. Wear SPF daily while using.   VITAMIN K PO Take by mouth daily.   No facility-administered encounter medications on file as of 03/03/2024.    Allergies (verified) Doxycycline, Erythromycin, Penicillins, Sulfa antibiotics, and Clindamycin   History: Past Medical History:  Diagnosis Date   Allergy    Anxiety     Arthritis    After surgerys   Cataract 15 yrs ago   Baby cataracts   Complete tear of right rotator cuff 01/18/2021   Depression    Diverticulitis    Emphysema of lung (HCC) 2021   Ct scan   GERD (gastroesophageal reflux disease) 2021   Ischemic colitis (HCC)    Mixed hyperlipidemia 10/05/2013   Neuromuscular disorder (HCC)    Osteoporosis    Osteopena   Rosacea    Past Surgical History:  Procedure Laterality Date   ABDOMINAL HYSTERECTOMY     APPENDECTOMY     CARPAL TUNNEL RELEASE Right 2004   LUMBAR LAMINECTOMY  2000   MICRODISCECTOMY LUMBAR     SHOULDER ARTHROSCOPY WITH ROTATOR CUFF REPAIR Right 12/21/2020   Dr. Anderson   SPINE SURGERY     2 back surgeries   TUBAL LIGATION     Family History  Problem Relation Age of Onset   Diabetes Father    Alcohol abuse Father    Cancer Father        prostate, lung   Hypertension Mother    Stroke Mother    Heart disease Mother    Diabetes Sister    Social History   Socioeconomic History   Marital status: Married    Spouse name: Not on file   Number of children: Not on file   Years of education: Not on file   Highest education level: 12th grade  Occupational History   Not on file  Tobacco Use   Smoking status: Former    Current packs/day: 0.00    Average packs/day: 1 pack/day for 30.0 years (30.0 ttl pk-yrs)    Types: Cigarettes    Start date: 10/04/1978    Quit date: 10/04/2008    Years since quitting: 15.4    Passive exposure: Past   Smokeless tobacco: Never  Vaping Use   Vaping status: Never Used  Substance and Sexual Activity   Alcohol use: No   Drug use: No  Sexual activity: Not on file  Other Topics Concern   Not on file  Social History Narrative   Not on file   Social Drivers of Health   Financial Resource Strain: Low Risk  (11/03/2023)   Overall Financial Resource Strain (CARDIA)    Difficulty of Paying Living Expenses: Not hard at all  Food Insecurity: No Food Insecurity (11/03/2023)   Hunger  Vital Sign    Worried About Running Out of Food in the Last Year: Never true    Ran Out of Food in the Last Year: Never true  Transportation Needs: No Transportation Needs (11/03/2023)   PRAPARE - Administrator, Civil Service (Medical): No    Lack of Transportation (Non-Medical): No  Physical Activity: Unknown (11/03/2023)   Exercise Vital Sign    Days of Exercise per Week: 0 days    Minutes of Exercise per Session: Not on file  Stress: No Stress Concern Present (11/03/2023)   [employer] of Occupational Health - Occupational Stress Questionnaire    Feeling of Stress : Only a little  Social Connections: Moderately Integrated (11/03/2023)   Social Connection and Isolation Panel    Frequency of Communication with Friends and Family: Twice a week    Frequency of Social Gatherings with Friends and Family: Once a week    Attends Religious Services: 1 to 4 times per year    Active Member of [employer] or Organizations: No    Attends Engineer, structural: Not on file    Marital Status: Married    Tobacco Counseling Counseling given: Not Answered   Clinical Intake:                        Activities of Daily Living    07/15/2023    4:46 PM  In your present state of health, do you have any difficulty performing the following activities:  Hearing? 0  Vision? 0  Difficulty concentrating or making decisions? 0  Walking or climbing stairs? 0  Dressing or bathing? 0  Doing errands, shopping? 0    Patient Care Team: Caudle, Thersia Bitters, FNP as PCP - General (Family Medicine) Luis Purchase, MD as Consulting Physician (Gastroenterology) Shona Rush, MD as Consulting Physician (Dermatology) Anderson Maude ORN, MD (Inactive) as Consulting Physician (Orthopedic Surgery)  Indicate any recent Medical Services you may have received from other than [facility] providers in the past year (date may be approximate).     Assessment:   This is a routine wellness  examination for Zia.  Hearing/Vision screen No results found.   Goals Addressed   None    Depression Screen    12/23/2023    2:39 PM 11/05/2023   10:59 AM 07/15/2023    4:46 PM 09/26/2022   12:16 PM 06/13/2022   10:45 PM 02/07/2022   11:53 AM 06/13/2021   11:57 PM  PHQ 2/9 Scores  PHQ - 2 Score 0 0 0 0 0 0 0  PHQ- 9 Score 0 0         Fall Risk    12/23/2023    2:39 PM 11/05/2023   10:58 AM 07/15/2023    4:46 PM 09/26/2022   12:15 PM 06/13/2022   10:45 PM  Fall Risk   Falls in the past year? 0 0 0 0 0  Number falls in past yr: 0 1     Injury with Fall? 0 0     Risk for fall due to : No  Fall Risks No Fall Risks   No Fall Risks  Follow up Falls evaluation completed Falls evaluation completed   Falls evaluation completed;Education provided;Falls prevention discussed      Data saved with a previous flowsheet row definition     MEDICARE RISK AT HOME:    TIMED UP AND GO:  Was the test performed?  No    Cognitive Function:        Immunizations Immunization History  Administered Date(s) Administered   Influenza Split 05/17/2013, 05/17/2014   Influenza, High Dose Seasonal PF 05/09/2015, 06/18/2016, 05/30/2017, 06/01/2018, 08/02/2019, 05/27/2023   Influenza-Unspecified 06/03/2018, 07/02/2020, 06/10/2022, 05/27/2023   Moderna Sars-Covid-2 Vaccination 05/01/2020, 05/29/2020   PNEUMOCOCCAL CONJUGATE-20 11/05/2023   PPD Test 05/04/2014   Pneumococcal Conjugate-13 05/09/2015   Pneumococcal Polysaccharide-23 10/04/2013   Td 08/30/2002   Tdap 04/07/2012   Varicella Zoster Immune Globulin 03/15/2016    TDAP status: Due, Education has been provided regarding the importance of this vaccine. Advised may receive this vaccine at local pharmacy or Health Dept. Aware to provide a copy of the vaccination record if obtained from local pharmacy or Health Dept. Verbalized acceptance and understanding.  Flu Vaccine status: Due, Education has been provided regarding the importance of  this vaccine. Advised may receive this vaccine at local pharmacy or Health Dept. Aware to provide a copy of the vaccination record if obtained from local pharmacy or Health Dept. Verbalized acceptance and understanding.  Pneumococcal vaccine status: Up to date  Covid-19 vaccine status: Completed vaccines  Qualifies for Shingles Vaccine? Yes   Zostavax completed No   Shingrix Completed?: No.    Education has been provided regarding the importance of this vaccine. Patient has been advised to call insurance company to determine out of pocket expense if they have not yet received this vaccine. Advised may also receive vaccine at local pharmacy or Health Dept. Verbalized acceptance and understanding.  Screening Tests Health Maintenance  Topic Date Due   Zoster Vaccines- Shingrix (1 of 2) Never done   COVID-19 Vaccine (3 - Moderna risk series) 06/26/2020   DTaP/Tdap/Td (3 - Td or Tdap) 04/07/2022   INFLUENZA VACCINE  03/12/2024   DEXA SCAN  08/21/2024   Lung Cancer Screening  11/30/2024   MAMMOGRAM  11/30/2024   Medicare Annual Wellness (AWV)  03/03/2025   Colonoscopy  12/16/2027   Pneumococcal Vaccine: 50+ Years  Completed   Hepatitis C Screening  Completed   Hepatitis B Vaccines  Aged Out   HPV VACCINES  Aged Out   Meningococcal B Vaccine  Aged Out    Health Maintenance  Health Maintenance Due  Topic Date Due   Zoster Vaccines- Shingrix (1 of 2) Never done   COVID-19 Vaccine (3 - Moderna risk series) 06/26/2020   DTaP/Tdap/Td (3 - Td or Tdap) 04/07/2022    Colorectal cancer screening: Type of screening: Colonoscopy. Completed 12/15/2017. Repeat every 10 years  Mammogram status: Completed 12/01/23. Repeat every year  Bone Density status: Completed 08/21/22. Results reflect: Bone density results: NORMAL. Repeat every 2 years.  Lung Cancer Screening: (Low Dose CT Chest recommended if Age 26-80 years, 20 pack-year currently smoking OR have quit w/in 15years.) does qualify.   Lung  Cancer Screening Referral: NA completed 12/01/23  Additional Screening:  Hepatitis C Screening: does qualify; Completed 06/18/2016  Vision Screening: Recommended annual ophthalmology exams for early detection of glaucoma and other disorders of the eye. Is the patient up to date with their annual eye exam?  No  Who is the provider  or what is the name of the office in which the patient attends annual eye exams? Doesn't know the name at this time but will call and make appt  If pt is not established with a provider, would they like to be referred to a provider to establish care? No .   Dental Screening: Recommended annual dental exams for proper oral hygiene   Community Resource Referral / Chronic Care Management: CRR required this visit?  No   CCM required this visit?  No     Plan:     I have personally reviewed and noted the following in the patient's chart:   Medical and social history Use of alcohol, tobacco or illicit drugs  Current medications and supplements including opioid prescriptions. Patient is not currently taking opioid prescriptions. Functional ability and status Nutritional status Physical activity Advanced directives List of other physicians Hospitalizations, surgeries, and ER visits in previous 12 months Vitals Screenings to include cognitive, depression, and falls Referrals and appointments  In addition, I have reviewed and discussed with patient certain preventive protocols, quality metrics, and best practice recommendations. A written personalized care plan for preventive services as well as general preventive health recommendations were provided to patient.     Kerri JONELLE Fuel, CMA   03/03/2024   After Visit Summary: (MyChart) Due to this being a telephonic visit, the after visit summary with patients personalized plan was offered to patient via MyChart   Nurse Notes:  Ms. Nass , Thank you for taking time to come for your Medicare Wellness Visit. I  appreciate your ongoing commitment to your health goals. Please review the following plan we discussed and let me know if I can assist you in the future.   These are the goals we discussed:  Goals      Exercise 150 min/wk Moderate Activity     Would like to continue to exervise      LDL CALC < 100        This is a list of the screening recommended for you and due dates:  Health Maintenance  Topic Date Due   Zoster (Shingles) Vaccine (1 of 2) Never done   COVID-19 Vaccine (3 - Moderna risk series) 06/26/2020   DTaP/Tdap/Td vaccine (3 - Td or Tdap) 04/07/2022   Flu Shot  03/12/2024   DEXA scan (bone density measurement)  08/21/2024   Screening for Lung Cancer  11/30/2024   Mammogram  11/30/2024   Medicare Annual Wellness Visit  03/03/2025   Colon Cancer Screening  12/16/2027   Pneumococcal Vaccine for age over 67  Completed   Hepatitis C Screening  Completed   Hepatitis B Vaccine  Aged Out   HPV Vaccine  Aged Out   Meningitis B Vaccine  Aged Out

## 2024-03-03 NOTE — Patient Instructions (Signed)
 Kathryn Lamb , Thank you for taking time to come for your Medicare Wellness Visit. I appreciate your ongoing commitment to your health goals. Please review the following plan we discussed and let me know if I can assist you in the future.   Screening recommendations/referrals: Colonoscopy: completed Mammogram: completed Bone Density: completed Recommended yearly ophthalmology/optometry visit for glaucoma screening and checkup Recommended yearly dental visit for hygiene and checkup  Vaccinations: Influenza vaccine: due in late August Pneumococcal vaccine: completed Tdap vaccine: completed Shingles vaccine: due information provided    Advanced directives: copy requested  Conditions/risks identified: falls  Next appointment: 1 year    Preventive Care 13 Years and Older, Female Preventive care refers to lifestyle choices and visits with your health care provider that can promote health and wellness. What does preventive care include? A yearly physical exam. This is also called an annual well check. Dental exams once or twice a year. Routine eye exams. Ask your health care provider how often you should have your eyes checked. Personal lifestyle choices, including: Daily care of your teeth and gums. Regular physical activity. Eating a healthy diet. Avoiding tobacco and drug use. Limiting alcohol use. Practicing safe sex. Taking low-dose aspirin every day. Taking vitamin and mineral supplements as recommended by your health care provider. What happens during an annual well check? The services and screenings done by your health care provider during your annual well check will depend on your age, overall health, lifestyle risk factors, and family history of disease. Counseling  Your health care provider may ask you questions about your: Alcohol use. Tobacco use. Drug use. Emotional well-being. Home and relationship well-being. Sexual activity. Eating habits. History of  falls. Memory and ability to understand (cognition). Work and work Astronomer. Reproductive health. Screening  You may have the following tests or measurements: Height, weight, and BMI. Blood pressure. Lipid and cholesterol levels. These may be checked every 5 years, or more frequently if you are over 19 years old. Skin check. Lung cancer screening. You may have this screening every year starting at age 16 if you have a 30-pack-year history of smoking and currently smoke or have quit within the past 15 years. Fecal occult blood test (FOBT) of the stool. You may have this test every year starting at age 26. Flexible sigmoidoscopy or colonoscopy. You may have a sigmoidoscopy every 5 years or a colonoscopy every 10 years starting at age 24. Hepatitis C blood test. Hepatitis B blood test. Sexually transmitted disease (STD) testing. Diabetes screening. This is done by checking your blood sugar (glucose) after you have not eaten for a while (fasting). You may have this done every 1-3 years. Bone density scan. This is done to screen for osteoporosis. You may have this done starting at age 60. Mammogram. This may be done every 1-2 years. Talk to your health care provider about how often you should have regular mammograms. Talk with your health care provider about your test results, treatment options, and if necessary, the need for more tests. Vaccines  Your health care provider may recommend certain vaccines, such as: Influenza vaccine. This is recommended every year. Tetanus, diphtheria, and acellular pertussis (Tdap, Td) vaccine. You may need a Td booster every 10 years. Zoster vaccine. You may need this after age 66. Pneumococcal 13-valent conjugate (PCV13) vaccine. One dose is recommended after age 64. Pneumococcal polysaccharide (PPSV23) vaccine. One dose is recommended after age 54. Talk to your health care provider about which screenings and vaccines you need and how  often you need  them. This information is not intended to replace advice given to you by your health care provider. Make sure you discuss any questions you have with your health care provider. Document Released: 08/25/2015 Document Revised: 04/17/2016 Document Reviewed: 05/30/2015 Elsevier Interactive Patient Education  2017 [employer].  Fall Prevention in the Home Falls can cause injuries. They can happen to people of all ages. There are many things you can do to make your home safe and to help prevent falls. What can I do on the outside of my home? Regularly fix the edges of walkways and driveways and fix any cracks. Remove anything that might make you trip as you walk through a door, such as a raised step or threshold. Trim any bushes or trees on the path to your home. Use bright outdoor lighting. Clear any walking paths of anything that might make someone trip, such as rocks or tools. Regularly check to see if handrails are loose or broken. Make sure that both sides of any steps have handrails. Any raised decks and porches should have guardrails on the edges. Have any leaves, snow, or ice cleared regularly. Use sand or salt on walking paths during winter. Clean up any spills in your garage right away. This includes oil or grease spills. What can I do in the bathroom? Use night lights. Install grab bars by the toilet and in the tub and shower. Do not use towel bars as grab bars. Use non-skid mats or decals in the tub or shower. If you need to sit down in the shower, use a plastic, non-slip stool. Keep the floor dry. Clean up any water that spills on the floor as soon as it happens. Remove soap buildup in the tub or shower regularly. Attach bath mats securely with double-sided non-slip rug tape. Do not have throw rugs and other things on the floor that can make you trip. What can I do in the bedroom? Use night lights. Make sure that you have a light by your bed that is easy to reach. Do not use  any sheets or blankets that are too big for your bed. They should not hang down onto the floor. Have a firm chair that has side arms. You can use this for support while you get dressed. Do not have throw rugs and other things on the floor that can make you trip. What can I do in the kitchen? Clean up any spills right away. Avoid walking on wet floors. Keep items that you use a lot in easy-to-reach places. If you need to reach something above you, use a strong step stool that has a grab bar. Keep electrical cords out of the way. Do not use floor polish or wax that makes floors slippery. If you must use wax, use non-skid floor wax. Do not have throw rugs and other things on the floor that can make you trip. What can I do with my stairs? Do not leave any items on the stairs. Make sure that there are handrails on both sides of the stairs and use them. Fix handrails that are broken or loose. Make sure that handrails are as long as the stairways. Check any carpeting to make sure that it is firmly attached to the stairs. Fix any carpet that is loose or worn. Avoid having throw rugs at the top or bottom of the stairs. If you do have throw rugs, attach them to the floor with carpet tape. Make sure that you have a  light switch at the top of the stairs and the bottom of the stairs. If you do not have them, ask someone to add them for you. What else can I do to help prevent falls? Wear shoes that: Do not have high heels. Have rubber bottoms. Are comfortable and fit you well. Are closed at the toe. Do not wear sandals. If you use a stepladder: Make sure that it is fully opened. Do not climb a closed stepladder. Make sure that both sides of the stepladder are locked into place. Ask someone to hold it for you, if possible. Clearly mark and make sure that you can see: Any grab bars or handrails. First and last steps. Where the edge of each step is. Use tools that help you move around (mobility aids)  if they are needed. These include: Canes. Walkers. Scooters. Crutches. Turn on the lights when you go into a dark area. Replace any light bulbs as soon as they burn out. Set up your furniture so you have a clear path. Avoid moving your furniture around. If any of your floors are uneven, fix them. If there are any pets around you, be aware of where they are. Review your medicines with your doctor. Some medicines can make you feel dizzy. This can increase your chance of falling. Ask your doctor what other things that you can do to help prevent falls. This information is not intended to replace advice given to you by your health care provider. Make sure you discuss any questions you have with your health care provider. Document Released: 05/25/2009 Document Revised: 01/04/2016 Document Reviewed: 09/02/2014 Elsevier Interactive Patient Education  2017 [employer].

## 2024-03-25 ENCOUNTER — Ambulatory Visit (INDEPENDENT_AMBULATORY_CARE_PROVIDER_SITE_OTHER): Admitting: Orthopedic Surgery

## 2024-03-25 ENCOUNTER — Other Ambulatory Visit (INDEPENDENT_AMBULATORY_CARE_PROVIDER_SITE_OTHER)

## 2024-03-25 DIAGNOSIS — M79641 Pain in right hand: Secondary | ICD-10-CM

## 2024-03-25 DIAGNOSIS — M1811 Unilateral primary osteoarthritis of first carpometacarpal joint, right hand: Secondary | ICD-10-CM | POA: Diagnosis not present

## 2024-03-25 DIAGNOSIS — M654 Radial styloid tenosynovitis [de Quervain]: Secondary | ICD-10-CM | POA: Diagnosis not present

## 2024-03-25 MED ORDER — BETAMETHASONE SOD PHOS & ACET 6 (3-3) MG/ML IJ SUSP
6.0000 mg | INTRAMUSCULAR | Status: AC | PRN
  Administered 2024-03-25: 6 mg via INTRA_ARTICULAR

## 2024-03-25 MED ORDER — LIDOCAINE HCL 1 % IJ SOLN
1.0000 mL | INTRAMUSCULAR | Status: AC | PRN
  Administered 2024-03-25: 1 mL

## 2024-03-25 NOTE — Progress Notes (Signed)
 [name] - 73 y.o. female MRN [id number]  Date of birth: 09-25-1950  Office Visit Note: Visit Date: 03/25/2024 PCP: Knute Thersia Bitters, FNP Referred by: Knute Thersia Bitters, *  Subjective: No chief complaint on file.  HPI: Kathryn Lamb is a pleasant 73 y.o. female who presents today for evaluation of ongoing right radial sided wrist pain as well as ongoing basilar thumb pain that has been present now for multiple months, worsening in nature.  Denies any recent injury.  Has a history of prior right carpal tunnel release over 20 years ago as well as right thumb trigger release approximately 3 years prior.  Has not undergone any formalized treatments for the right thumb basal joint pain or right radial sided wrist pain.  She is here today for specific hand surgical evaluation.  Pertinent ROS were reviewed with the patient and found to be negative unless otherwise specified above in HPI.   Visit Reason: radial pain right wrist and thumb CMC  Duration of symptoms: multiple months Hand dominance: right Occupation: retired Diabetic: No Smoking: No Heart/Lung History: none Blood Thinners: aspirin daily  Prior Testing/EMG: none Injections (Date):  Treatments: ibuprofen, Voltaren   Prior Surgery: trigger finger release right thumb around 3years ago CTR- right hand over 20 years ago  Right shoulder surgery 3 years ago- need up with a popeye  Assessment & Plan: Visit Diagnoses:  1. Arthritis of carpometacarpal (CMC) joint of right thumb   2. Pain in right hand   3. Tenosynovitis, de Quervain     Plan: Extensive discussion was had with the patient today regarding her left thumb basilar joint pain.  X-rays today confirm diagnosis of ongoing left thumb CMC arthritis which correlates with her clinical examination.  We reviewed the etiology and pathophysiology of this condition as well as appropriate treatment modalities ranging from conservative to surgical.  From a conservative  standpoint, we discussed bracing, activity modification, nonsteroidal anti-inflammatory medications both oral and topical, and finally cortisone injections.  From surgical standpoint, we discussed the possibility for left thumb CMC arthroplasty in the future should symptoms refractory to conservative care.  I discussed the surgical treatment as well as the postop protocol in detail with her as well today for her understanding.  At this juncture, given that she has not undergone any formalized treatment, we will have her fitted for a Comfort Cool brace and will perform cortisone injection to the left thumb CMC interval today for symptom relief.  Risk and benefits of the injection were discussed in detail, patient elected to proceed.  In addition extensive discussion was had with the patient today regarding her right wrist radial sided pain.  Patient has signs and symptoms consistent with de Quervain's tenosynovitis.  We discussed the underlying etiology and pathophysiology of this condition as well as treatment modalities ranging from conservative to surgical.  From a conservative standpoint, we discussed activity modification, anti-inflammatory medication both topical and oral, bracing, therapy and cortisone injections.  From a surgical standpoint, I explained to patient that we can perform right wrist first extensor compartment release should symptoms remain affected conservative care.  She elected to undergo cortisone injection to the right wrist first extensor compartment as well today which was also tolerated well.  I will follow-up both issues in approximate 6 weeks to track how she responded to the injections.  Comfort Cool brace was also given today.   Follow-up: No follow-ups on file.   Meds & Orders: No orders of the defined  types were placed in this encounter.   Orders Placed This Encounter  Procedures   Hand/UE Inj   Hand/UE Inj   XR Hand Complete Right     Procedures: Hand/UE Inj:  R thumb CMC for osteoarthritis on 03/25/2024 8:01 PM Indications: pain Details: 25 G needle Medications: 1 mL lidocaine  1 %; 6 mg betamethasone  acetate-betamethasone  sodium phosphate 6 (3-3) MG/ML Outcome: tolerated well, no immediate complications Procedure, treatment alternatives, risks and benefits explained, specific risks discussed.    Hand/UE Inj: R extensor compartment 1 for de Quervain's tenosynovitis on 03/25/2024 8:01 PM Indications: pain Details: 22 G needle Medications: 1 mL lidocaine  1 %; 6 mg betamethasone  acetate-betamethasone  sodium phosphate 6 (3-3) MG/ML Outcome: tolerated well, no immediate complications Procedure, treatment alternatives, risks and benefits explained, specific risks discussed. Consent was given by the patient.          Clinical History: No specialty comments available.  She reports that she quit smoking about 15 years ago. Her smoking use included cigarettes. She started smoking about 45 years ago. She has a 30 pack-year smoking history. She has been exposed to tobacco smoke. She has never used smokeless tobacco.  Recent Labs    04/08/23 1238 07/15/23 1556 12/18/23 1029  HGBA1C 6.0* 6.0* 6.0*    Objective:   Vital Signs: There were no vitals taken for this visit.  Physical Exam  Gen: Well-appearing, in no acute distress; non-toxic CV: Regular Rate. Well-perfused. Warm.  Resp: Breathing unlabored on room air; no wheezing. Psych: Fluid speech in conversation; appropriate affect; normal thought process  Ortho Exam General: Patient is well appearing and in no distress.    Skin and Muscle: Muscle bulk and contour normal, no signs of atrophy.      Range of Motion and Palpation Tests: Mobility is full about the elbows with flexion and extension.  Forearm supination and pronation are 85/85 bilaterally.  Wrist flexion/extension is 75/65 bilaterally.  Digital flexion and extension are full.  Thumb opposition is full to the base of the small  fingers bilaterally.     No cords or nodules are palpated.  No triggering is observed.     Significant tenderness over the right thumb CMC articulation is observed, positive grind for pain, positive crepitus.  MP hyperextension mild, less than 10 degrees.    Finklestein test positive right with significant swelling at the wrist and radial styloid region   Neurologic, Vascular, Motor: Sensation is intact to light touch in the median/radial/ulnar distributions.  Fingers pink and well perfused.  Capillary refill is brisk.     Imaging: XR Hand Complete Right Result Date: 03/25/2024 Notable degenerative changes are seen at the thumb [facility] interval with joint space narrowing, osteophyte formation and subchondral sclerosis.  Also evidence of STT arthritic changes.   Past Medical/Family/Surgical/Social History: Medications & Allergies reviewed per EMR, new medications updated. Patient Active Problem List   Diagnosis Date Noted   Former smoker (30 pack/year, quit 2011) 02/07/2021   Labile hypertension 05/28/2020   Osteoarthritis of right AC (acromioclavicular) joint 01/18/2020   Os acromiale of right shoulder 01/18/2020   Carpal tunnel syndrome, left upper limb 01/18/2020   Aortic atherosclerosis (HCC) by Chest CT scan 01/07/2020 01/11/2020   Emphysema lung (HCC) 01/11/2020   Diverticulosis large intestine w/o perforation or abscess w/o bleeding 03/13/2018   Abnormal glucose 08/22/2014   Hyperlipidemia, mixed 10/05/2013   Rosacea    Anxiety    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HCC)    History of ischemic colitis  Past Medical History:  Diagnosis Date   Allergy    Anxiety    Arthritis    After surgerys   Cataract 15 yrs ago   Baby cataracts   Complete tear of right rotator cuff 01/18/2021   Depression    Diverticulitis    Emphysema of lung (HCC) 2021   Ct scan   GERD (gastroesophageal reflux disease) 2021   Ischemic colitis (HCC)    Mixed hyperlipidemia 10/05/2013    Neuromuscular disorder (HCC)    Osteoporosis    Osteopena   Rosacea    Family History  Problem Relation Age of Onset   Diabetes Father    Alcohol abuse Father    Cancer Father        prostate, lung   Hypertension Mother    Stroke Mother    Heart disease Mother    Diabetes Sister    Past Surgical History:  Procedure Laterality Date   ABDOMINAL HYSTERECTOMY     APPENDECTOMY     CARPAL TUNNEL RELEASE Right 2004   LUMBAR LAMINECTOMY  2000   MICRODISCECTOMY LUMBAR     SHOULDER ARTHROSCOPY WITH ROTATOR CUFF REPAIR Right 12/21/2020   Dr. Anderson   SPINE SURGERY     2 back surgeries   TUBAL LIGATION     Social History   Occupational History   Not on file  Tobacco Use   Smoking status: Former    Current packs/day: 0.00    Average packs/day: 1 pack/day for 30.0 years (30.0 ttl pk-yrs)    Types: Cigarettes    Start date: 10/04/1978    Quit date: 10/04/2008    Years since quitting: 15.4    Passive exposure: Past   Smokeless tobacco: Never  Vaping Use   Vaping status: Never Used  Substance and Sexual Activity   Alcohol use: No   Drug use: No   Sexual activity: Not on file    Rosha Cocker Afton Alderton, M.D. [facility], Hand Surgery

## 2024-05-05 ENCOUNTER — Ambulatory Visit: Admitting: Orthopedic Surgery

## 2024-05-05 DIAGNOSIS — M654 Radial styloid tenosynovitis [de Quervain]: Secondary | ICD-10-CM | POA: Diagnosis not present

## 2024-05-05 DIAGNOSIS — M1811 Unilateral primary osteoarthritis of first carpometacarpal joint, right hand: Secondary | ICD-10-CM | POA: Diagnosis not present

## 2024-05-05 NOTE — Progress Notes (Unsigned)
 Kathryn Lamb - 73 y.o. female MRN [id number]  Date of birth: May 25, 1951  Office Visit Note: Visit Date: 05/05/2024 PCP: Knute Thersia Bitters, FNP Referred by: Knute Thersia Bitters, *  Subjective: No chief complaint on file.  HPI: Kathryn Lamb is a pleasant 73 y.o. female who returns today for follow-up regarding right wrist de Quervain's tenosynovitis and thumb CMC arthritis that underwent injection approximate 6 weeks prior.  She states that the injections helped significantly.  Is utilizing the bracing as instructed as well for activities.   Pertinent ROS were reviewed with the patient and found to be negative unless otherwise specified above in HPI.    Assessment & Plan: Visit Diagnoses:  1. Arthritis of carpometacarpal (CMC) joint of right thumb   2. Tenosynovitis, de Quervain      Plan: She is doing very well status post right thumb CMC injection and first tensor compartment injection.  I am pleased to see that her symptoms are responding well to conservative treatments with injection and bracing as well as activity modification.  We discussed that the symptoms could recur in both regions in the future, at which point surgical intervention may be warranted.  She expressed understanding and will return to me as needed moving forward  Follow-up: No follow-ups on file.   Meds & Orders: No orders of the defined types were placed in this encounter.   No orders of the defined types were placed in this encounter.    Procedures: No procedures performed      Clinical History: No specialty comments available.  She reports that she quit smoking about 15 years ago. Her smoking use included cigarettes. She started smoking about 45 years ago. She has a 30 pack-year smoking history. She has been exposed to tobacco smoke. She has never used smokeless tobacco.  Recent Labs    07/15/23 1556 12/18/23 1029  HGBA1C 6.0* 6.0*    Objective:   Vital Signs: There were no vitals  taken for this visit.  Physical Exam  Gen: Well-appearing, in no acute distress; non-toxic CV: Regular Rate. Well-perfused. Warm.  Resp: Breathing unlabored on room air; no wheezing. Psych: Fluid speech in conversation; appropriate affect; normal thought process  Ortho Exam General: Patient is well appearing and in no distress.    Skin and Muscle: Muscle bulk and contour normal, no signs of atrophy.      Range of Motion and Palpation Tests: Mobility is full about the elbows with flexion and extension.  Forearm supination and pronation are 85/85 bilaterally.  Wrist flexion/extension is 75/65 bilaterally.  Digital flexion and extension are full.  Thumb opposition is full to the base of the small fingers bilaterally.     No cords or nodules are palpated.  No triggering is observed.     Mild tenderness over the right thumb CMC articulation is observed, positive grind for pain, positive crepitus.  MP hyperextension mild, less than 10 degrees.    Finklestein test negative right with minimal swelling at the wrist and radial styloid region   Neurologic, Vascular, Motor: Sensation is intact to light touch in the median/radial/ulnar distributions.  Fingers pink and well perfused.  Capillary refill is brisk.     Imaging: No results found.   Past Medical/Family/Surgical/Social History: Medications & Allergies reviewed per EMR, new medications updated. Patient Active Problem List   Diagnosis Date Noted   Former smoker (30 pack/year, quit 2011) 02/07/2021   Labile hypertension 05/28/2020   Osteoarthritis of right AC (acromioclavicular)  joint 01/18/2020   Os acromiale of right shoulder 01/18/2020   Carpal tunnel syndrome, left upper limb 01/18/2020   Aortic atherosclerosis (HCC) by Chest CT scan 01/07/2020 01/11/2020   Emphysema lung (HCC) 01/11/2020   Diverticulosis large intestine w/o perforation or abscess w/o bleeding 03/13/2018   Abnormal glucose 08/22/2014   Hyperlipidemia, mixed  10/05/2013   Rosacea    Anxiety    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HCC)    History of ischemic colitis    Past Medical History:  Diagnosis Date   Allergy    Anxiety    Arthritis    After surgerys   Cataract 15 yrs ago   Baby cataracts   Complete tear of right rotator cuff 01/18/2021   Depression    Diverticulitis    Emphysema of lung (HCC) 2021   Ct scan   GERD (gastroesophageal reflux disease) 2021   Ischemic colitis    Mixed hyperlipidemia 10/05/2013   Neuromuscular disorder (HCC)    Osteoporosis    Osteopena   Rosacea    Family History  Problem Relation Age of Onset   Diabetes Father    Alcohol abuse Father    Cancer Father        prostate, lung   Hypertension Mother    Stroke Mother    Heart disease Mother    Diabetes Sister    Past Surgical History:  Procedure Laterality Date   ABDOMINAL HYSTERECTOMY     APPENDECTOMY     CARPAL TUNNEL RELEASE Right 2004   LUMBAR LAMINECTOMY  2000   MICRODISCECTOMY LUMBAR     SHOULDER ARTHROSCOPY WITH ROTATOR CUFF REPAIR Right 12/21/2020   Dr. Anderson   SPINE SURGERY     2 back surgeries   TUBAL LIGATION     Social History   Occupational History   Not on file  Tobacco Use   Smoking status: Former    Current packs/day: 0.00    Average packs/day: 1 pack/day for 30.0 years (30.0 ttl pk-yrs)    Types: Cigarettes    Start date: 10/04/1978    Quit date: 10/04/2008    Years since quitting: 15.5    Passive exposure: Past   Smokeless tobacco: Never  Vaping Use   Vaping status: Never Used  Substance and Sexual Activity   Alcohol use: No   Drug use: No   Sexual activity: Not on file    Tenishia Ekman Afton Alderton, M.D. [facility], Hand Surgery

## 2024-05-18 ENCOUNTER — Other Ambulatory Visit: Payer: Self-pay

## 2024-05-18 ENCOUNTER — Emergency Department (HOSPITAL_BASED_OUTPATIENT_CLINIC_OR_DEPARTMENT_OTHER): Admitting: Radiology

## 2024-05-18 ENCOUNTER — Emergency Department (HOSPITAL_BASED_OUTPATIENT_CLINIC_OR_DEPARTMENT_OTHER)
Admission: EM | Admit: 2024-05-18 | Discharge: 2024-05-18 | Disposition: A | Discharge status: Home / Self Care | Attending: Emergency Medicine | Admitting: Emergency Medicine

## 2024-05-18 ENCOUNTER — Encounter (HOSPITAL_BASED_OUTPATIENT_CLINIC_OR_DEPARTMENT_OTHER): Payer: Self-pay

## 2024-05-18 DIAGNOSIS — Y9301 Activity, walking, marching and hiking: Secondary | ICD-10-CM | POA: Diagnosis not present

## 2024-05-18 DIAGNOSIS — S4991XA Unspecified injury of right shoulder and upper arm, initial encounter: Secondary | ICD-10-CM | POA: Diagnosis present

## 2024-05-18 DIAGNOSIS — M79604 Pain in right leg: Secondary | ICD-10-CM | POA: Insufficient documentation

## 2024-05-18 DIAGNOSIS — S40011A Contusion of right shoulder, initial encounter: Secondary | ICD-10-CM | POA: Insufficient documentation

## 2024-05-18 DIAGNOSIS — W19XXXA Unspecified fall, initial encounter: Secondary | ICD-10-CM | POA: Insufficient documentation

## 2024-05-18 DIAGNOSIS — Z7982 Long term (current) use of aspirin: Secondary | ICD-10-CM | POA: Insufficient documentation

## 2024-05-18 DIAGNOSIS — M19011 Primary osteoarthritis, right shoulder: Secondary | ICD-10-CM | POA: Diagnosis not present

## 2024-05-18 DIAGNOSIS — M79621 Pain in right upper arm: Secondary | ICD-10-CM | POA: Diagnosis not present

## 2024-05-18 NOTE — ED Notes (Signed)
 ED Provider at bedside.

## 2024-05-18 NOTE — ED Provider Notes (Signed)
 [facility] Provider Note   CSN: [id number] Arrival date & time: 05/18/24  1252     Patient presents with: Kathryn Lamb is a 73 y.o. female.   Patient is a 73 year old female who presents with pain in her right shoulder after a fall yesterday.  She said she was walking up some stairs and something caused her to trip and fall, she fell onto her right shoulder.  She has had prior rotator cuff surgery about 3 years ago in that arm.  She has had ongoing pain in her right shoulder.  She denies any head injury.  She denies any other injuries.  She does have some pain in her right leg without chronic and she has reach out to her orthopedic surgeon to follow-up regarding that.  She denies any neck or back pain.  She is not anticoagulants.       Prior to Admission medications   Medication Sig Start Date End Date Taking? Authorizing Provider  albuterol  (VENTOLIN  HFA) 108 (90 Base) MCG/ACT inhaler Inhale 2 puffs into the lungs every 6 (six) hours as needed for wheezing or shortness of breath. 11/05/23   Caudle, Thersia Bitters, FNP  aspirin EC 81 MG tablet Take 81 mg by mouth daily. Swallow whole.    [provider]  atorvastatin  (LIPITOR) 80 MG tablet TAKE 1 TABLET BY MOUTH  DAILY FOR CHOLESTEROL 01/15/24   Caudle, Thersia Bitters, FNP  buPROPion  (WELLBUTRIN  XL) 300 MG 24 hr tablet Take  1 tablet  Daily for Mood , Focus & Concentration                                            /                                                                   TAKE                                         BY                                                 MOUTH 03/27/23   Tonita Fallow, MD  cetirizine (ZYRTEC) 10 MG tablet Take 10 mg by mouth daily.    [provider]  Cholecalciferol (VITAMIN D ) 125 MCG (5000 UT) CAPS Take 1 capsule Daily 12/06/20   Tonita Fallow, MD  cyanocobalamin  1000 MCG tablet Take 1,000 mcg by mouth daily.    [provider]  famotidine  (PEPCID ) 40 MG tablet TAKE 1 TABLET BY MOUTH  DAILY FOR ACID REFLUX AND  INDIGESTION 03/03/24   Caudle, Thersia Bitters, FNP  gabapentin  (NEURONTIN ) 300 MG capsule TAKE 1 CAPSULE BY MOUTH 3 TIMES  DAILY FOR PAIN 02/17/23   Wilkinson, Dana E, NP  metroNIDAZOLE  (METROGEL ) 0.75 % gel Apply 1 Application topically 2 (two) times daily. For  rosacea 12/31/23   Caudle, Thersia Bitters, FNP  Multiple Vitamin (MULTIVITAMIN WITH MINERALS) TABS tablet Take 1 tablet by mouth daily.    [provider]  Probiotic Product (PROBIOTIC DAILY PO) Take by mouth daily.    [provider]  TRELEGY ELLIPTA  100-62.5-25 MCG/ACT AEPB USE 1 INHALATION BY MOUTH DAILY 03/03/24   Caudle, Thersia Bitters, FNP  tretinoin  (RETIN-A ) 0.025 % cream Apply topically at bedtime. Decrease frequency of use if skin irritation occurs. Wear SPF daily while using. 12/23/23   CaudleThersia Bitters, FNP  VITAMIN K PO Take by mouth daily.    [provider]    Allergies: Doxycycline, Erythromycin, Penicillins, Sulfa antibiotics, and Clindamycin    Review of Systems  Constitutional:  Negative for fever.  Gastrointestinal:  Negative for nausea and vomiting.  Musculoskeletal:  Positive for arthralgias and joint swelling. Negative for back pain and neck pain.  Skin:  Positive for wound.  Neurological:  Negative for weakness, numbness and headaches.    Updated Vital Signs BP (!) 142/59 (BP Location: Left Arm)   Pulse 64   Temp 97.7 F (36.5 C) (Oral)   Resp 17   Ht 5' 3 (1.6 m)   Wt 65.3 kg   SpO2 95%   BMI 25.51 kg/m   Physical Exam Constitutional:      Appearance: She is well-developed.  HENT:     Head: Normocephalic and atraumatic.  Neck:     Comments: No pain of the cervical, thoracic or lumbosacral spine Cardiovascular:     Rate and Rhythm: Normal rate.  Pulmonary:     Effort: Pulmonary effort is normal.  Musculoskeletal:        General: Tenderness present.     Cervical back:  Normal range of motion and neck supple.     Comments: Positive tenderness to the right shoulder.  There is pain around the shoulder and in the posterior scapular area.  There is pain on any range of motion of the right shoulder.  No pain to the elbow or wrist.  She has normal sensation and motor function distally.  Pedal pulses intact.  No other pain on palpation or range of motion of the extremities, including range of motion of the right hip.  Skin:    General: Skin is warm and dry.  Neurological:     Mental Status: She is alert and oriented to person, place, and time.     (all labs ordered are listed, but only abnormal results are displayed) Labs Reviewed - No data to display  EKG: None  Radiology: DG Shoulder Right Result Date: 05/18/2024 CLINICAL DATA:  Right upper arm pain after mechanical fall last night. Previous right rotator cuff surgery 3 years ago. Decreased range of motion. EXAM: RIGHT SHOULDER - 2+ VIEW COMPARISON:  01/18/2020 FINDINGS: Prominent degenerative changes in the acromioclavicular joint with large osteophyte formation. Likely previous resurfacing of the acetabulum. No evidence of acute fracture or dislocation. No focal bone lesion or bone destruction. Soft tissues are unremarkable. IMPRESSION: Prominent degenerative changes in the glenohumeral joint. No acute bony abnormalities. Electronically Signed   By: Elsie Gravely M.D.   On: 05/18/2024 16:07     Procedures   Medications Ordered in the ED - No data to display                                  Medical Decision Making  Patient is a 73 year old  female who presents with pain in her right shoulder after a mechanical fall yesterday.  She denies any symptoms preceding the fall that would be more concerning for presyncope or ACS/arrhythmia.  She has pain in her right shoulder.  She denies any other injuries.  She did not hit her head.  No neck or back pain.  She had x-rays of the shoulder which were interpreted  by me and confirmed by the radiologist to show no definite bony injuries.  No neurologic deficits.  History is obtained from the patient and the patient's husband at bedside.  She was discharged home in good condition.  She has a sling at home that she can use for the next few days.  I did advise her to take it out and move it around periodically to avoid frozen shoulder.  Advised on symptomatic care.  She is going to make an appointment to follow-up with her orthopedist.  Return precautions were given.     Final diagnoses:  Contusion of multiple sites of right shoulder, initial encounter    ED Discharge Orders     None          Lenor Hollering, MD 05/18/24 1649

## 2024-05-18 NOTE — ED Triage Notes (Signed)
 C/o R shoulder and mostly upper R arm pain after mechanical fall last night. Reports R rotator cuff surgery x3 years ago  and torn ligament. Denies thinners, denies hitting head. Takes 80 mg ASA daily. Decreased ROM d/t pain. Also c/o R hip and upper leg pain prior to fall that has worsened since. Ambulatory to triage.

## 2024-05-18 NOTE — ED Notes (Signed)
 Patient transported to X-Ray

## 2024-05-18 NOTE — Discharge Instructions (Addendum)
 Make an appointment to follow-up with your orthopedist.  Return to the emergency room if you have any worsening symptoms.

## 2024-05-18 NOTE — ED Notes (Signed)
 Reviewed discharge instructions and follow-up care with pt. Pt verbalized understanding and had no further questions. Pt exited ED without complications.

## 2024-05-20 ENCOUNTER — Encounter: Admitting: Sports Medicine

## 2024-06-14 ENCOUNTER — Encounter: Payer: Self-pay | Admitting: Radiology

## 2024-06-21 ENCOUNTER — Encounter: Payer: Self-pay | Admitting: Orthopedic Surgery

## 2024-06-21 ENCOUNTER — Ambulatory Visit: Admitting: Orthopedic Surgery

## 2024-06-21 DIAGNOSIS — M12811 Other specific arthropathies, not elsewhere classified, right shoulder: Secondary | ICD-10-CM | POA: Diagnosis not present

## 2024-06-21 NOTE — Progress Notes (Unsigned)
 Office Visit Note   Patient: Kathryn Lamb           Date of Birth: 04-20-1951           MRN: [id number] Visit Date: 06/21/2024 Requested by: Knute Thersia Bitters, FNP [address] [place],  [place] [address] PCP: Knute Thersia Bitters, FNP  Subjective: Chief Complaint  Patient presents with   Right Shoulder - Pain    HPI: Kathryn Lamb is a 73 y.o. female who presents to the office reporting right shoulder pain.  Patient had a fall onto the right upper extremity 10-25.  Prior rotator cuff tear repair by Dr. Anderson in 2022.  Patient does report some weakness as well as decreased range of motion.  Slowly getting better.  She reports some scapular pain as well.  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  She has tried ice and over-the-counter medication..                ROS: All systems reviewed are negative as they relate to the chief complaint within the history of present illness.  Patient denies fevers or chills.  Assessment & Plan: Visit Diagnoses: No diagnosis found.  Plan: Impression is rotator cuff arthropathy with likely retearing of the rotator cuff based on weakness with external rotation today along with coarseness with passive motion.  She wants to avoid surgery at all costs.  She actually has about 90 degrees of active abduction at this time and that may continue to get better.  We talked about physical therapy but she wants to try to work this out on her own.  Plan at this time is 8-week return with decision for against injection for pain relief at that time and decision for or against physical therapy at that time to increase her functionality.  Follow-Up Instructions: No follow-ups on file.   Orders:  No orders of the defined types were placed in this encounter.  No orders of the defined types were placed in this encounter.     Procedures: No procedures performed   Clinical Data: No additional findings.  Objective: Vital Signs:  There were no vitals taken for this visit.  Physical Exam:  Constitutional: Patient appears well-developed HEENT:  Head: Normocephalic Eyes:EOM are normal Neck: Normal range of motion Cardiovascular: Normal rate Pulmonary/chest: Effort normal Neurologic: Patient is alert Skin: Skin is warm Psychiatric: Patient has normal mood and affect  Ortho Exam: Ortho exam demonstrates range of motion of the right of 60/100/160.  Range of motion left 80/110/180.  Patient does have some decreased external rotation strength 4 out of 5 on the right.  Subscap testing is 5+ out of 5 bilaterally.  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  Deltoid fires.  Shoulder is located.  Motor or sensory function of the hand is intact.  Specialty Comments:  No specialty comments available.  Imaging: No results found.   PMFS History: Patient Active Problem List   Diagnosis Date Noted   Former smoker (30 pack/year, quit 2011) 02/07/2021   Labile hypertension 05/28/2020   Osteoarthritis of right AC (acromioclavicular) joint 01/18/2020   Os acromiale of right shoulder 01/18/2020   Carpal tunnel syndrome, left upper limb 01/18/2020   Aortic atherosclerosis (HCC) by Chest CT scan 01/07/2020 01/11/2020   Emphysema lung (HCC) 01/11/2020   Diverticulosis large intestine w/o perforation or abscess w/o bleeding 03/13/2018   Abnormal glucose 08/22/2014   Hyperlipidemia, mixed 10/05/2013   Rosacea    Anxiety  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HCC)    History of ischemic colitis    Past Medical History:  Diagnosis Date   Allergy    Anxiety    Arthritis    After surgerys   Cataract 15 yrs ago   Baby cataracts   Complete tear of right rotator cuff 01/18/2021   Depression    Diverticulitis    Emphysema of lung (HCC) 2021   Ct scan   GERD (gastroesophageal reflux disease) 2021   Ischemic colitis    Mixed hyperlipidemia 10/05/2013   Neuromuscular disorder (HCC)    Osteoporosis    Osteopena   Rosacea      Family History  Problem Relation Age of Onset   Diabetes Father    Alcohol abuse Father    Cancer Father        prostate, lung   Hypertension Mother    Stroke Mother    Heart disease Mother    Diabetes Sister     Past Surgical History:  Procedure Laterality Date   ABDOMINAL HYSTERECTOMY     APPENDECTOMY     CARPAL TUNNEL RELEASE Right 2004   LUMBAR LAMINECTOMY  2000   MICRODISCECTOMY LUMBAR     SHOULDER ARTHROSCOPY WITH ROTATOR CUFF REPAIR Right 12/21/2020   Dr. Anderson   SPINE SURGERY     2 back surgeries   TUBAL LIGATION     Social History   Occupational History   Not on file  Tobacco Use   Smoking status: Former    Current packs/day: 0.00    Average packs/day: 1 pack/day for 30.0 years (30.0 ttl pk-yrs)    Types: Cigarettes    Start date: 10/04/1978    Quit date: 10/04/2008    Years since quitting: 15.7    Passive exposure: Past   Smokeless tobacco: Never  Vaping Use   Vaping status: Never Used  Substance and Sexual Activity   Alcohol use: No   Drug use: No   Sexual activity: Not on file

## 2024-06-22 NOTE — Progress Notes (Unsigned)
 Kathryn Lamb - 73 y.o. female MRN [id number]  Date of birth: 05-08-1951  Office Visit Note: Visit Date: 06/23/2024 PCP: Knute Thersia Bitters, FNP Referred by: Arlinda Buster, MD  Subjective: No chief complaint on file.  HPI: Kathryn Lamb is a pleasant 73 y.o. female who returns today for follow-up regarding right wrist de Quervain's tenosynovitis and thumb CMC arthritis that underwent injection approximate 3 months prior.  She states that the injections helped significantly initially, did have a recent fall which exacerbated her symptoms..  Is utilizing the bracing as instructed as well for activities.   Pertinent ROS were reviewed with the patient and found to be negative unless otherwise specified above in HPI.    Assessment & Plan: Visit Diagnoses:  1. Arthritis of carpometacarpal (CMC) joint of right thumb   2. Tenosynovitis, de Quervain       Plan: Extensive discussion was had with the patient today regarding her right thumb basilar joint pain.  X-rays from prior confirm diagnosis of ongoing right thumb CMC arthritis which correlates with her clinical examination.  We reviewed the etiology and pathophysiology of this condition as well as appropriate treatment modalities ranging from conservative to surgical.  From a conservative standpoint, we discussed bracing, activity modification, nonsteroidal anti-inflammatory medications both oral and topical, and finally cortisone injections.  From surgical standpoint, we discussed the possibility for left thumb CMC arthroplasty in the future should symptoms refractory to conservative care.  I discussed the surgical treatment as well as the postop protocol in detail with her as well today for her understanding.   At this juncture, she would like to trial repeat injections to the right thumb CMC region and right wrist first extensor compartment.  We discussed the underlying etiology and pathophysiology of both condition as well as  treatment modalities ranging from conservative to surgical.  From a conservative standpoint, we discussed activity modification, anti-inflammatory medication both topical and oral, bracing, therapy and cortisone injections.  From a surgical standpoint, I explained to patient that we can perform right wrist first extensor compartment release in conjunction with the right thumb [facility] arthroplasty should symptoms remain refractory conservative care.   She elected to undergo cortisone injection to the right wrist first extensor compartment and the right thumb CMC region as well today which was also tolerated well.  I spent 30 minutes in the care of this patient today including review of previous documentation, imaging obtained, face-to-face time discussing all options regarding treatment and documenting the encounter.  Follow-up in 6 weeks.  Follow-up: No follow-ups on file.   Meds & Orders: No orders of the defined types were placed in this encounter.   No orders of the defined types were placed in this encounter.    Procedures: Hand/UE Inj: R thumb CMC for osteoarthritis on 06/23/2024 10:34 PM Indications: pain Details: 25 G needle Medications: 1 mL lidocaine  1 %; 6 mg betamethasone  acetate-betamethasone  sodium phosphate 6 (3-3) MG/ML Outcome: tolerated well, no immediate complications   Hand/UE Inj: R extensor compartment 1 for de Quervain's tenosynovitis on 06/23/2024 10:34 PM Indications: pain Details: 22 G needle Medications: 1 mL lidocaine  1 %; 6 mg betamethasone  acetate-betamethasone  sodium phosphate 6 (3-3) MG/ML Outcome: tolerated well, no immediate complications Procedure, treatment alternatives, risks and benefits explained, specific risks discussed. Consent was given by the patient.          Clinical History: No specialty comments available.  She reports that she quit smoking about 15 years ago. Her smoking use  included cigarettes. She started smoking about 45 years ago.  She has a 30 pack-year smoking history. She has been exposed to tobacco smoke. She has never used smokeless tobacco.  Recent Labs    07/15/23 1556 12/18/23 1029  HGBA1C 6.0* 6.0*    Objective:   Vital Signs: There were no vitals taken for this visit.  Physical Exam  Gen: Well-appearing, in no acute distress; non-toxic CV: Regular Rate. Well-perfused. Warm.  Resp: Breathing unlabored on room air; no wheezing. Psych: Fluid speech in conversation; appropriate affect; normal thought process  Ortho Exam General: Patient is well appearing and in no distress.    Skin and Muscle: Muscle bulk and contour normal, no signs of atrophy.      Range of Motion and Palpation Tests: Mobility is full about the elbows with flexion and extension.  Forearm supination and pronation are 85/85 bilaterally.  Wrist flexion/extension is 75/65 bilaterally.  Digital flexion and extension are full.  Thumb opposition is full to the base of the small fingers bilaterally.     No cords or nodules are palpated.  No triggering is observed.     Mild tenderness over the right thumb CMC articulation is observed, positive grind for pain, positive crepitus.  MP hyperextension mild, less than 10 degrees.    Finklestein test negative right with minimal swelling at the wrist and radial styloid region   Neurologic, Vascular, Motor: Sensation is intact to light touch in the median/radial/ulnar distributions.  Fingers pink and well perfused.  Capillary refill is brisk.     Imaging: No results found.   Past Medical/Family/Surgical/Social History: Medications & Allergies reviewed per EMR, new medications updated. Patient Active Problem List   Diagnosis Date Noted   Former smoker (30 pack/year, quit 2011) 02/07/2021   Labile hypertension 05/28/2020   Osteoarthritis of right AC (acromioclavicular) joint 01/18/2020   Os acromiale of right shoulder 01/18/2020   Carpal tunnel syndrome, left upper limb 01/18/2020   Aortic  atherosclerosis (HCC) by Chest CT scan 01/07/2020 01/11/2020   Emphysema lung (HCC) 01/11/2020   Diverticulosis large intestine w/o perforation or abscess w/o bleeding 03/13/2018   Abnormal glucose 08/22/2014   Hyperlipidemia, mixed 10/05/2013   Rosacea    Anxiety    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HCC)    History of ischemic colitis    Past Medical History:  Diagnosis Date   Allergy    Anxiety    Arthritis    After surgerys   Cataract 15 yrs ago   Baby cataracts   Complete tear of right rotator cuff 01/18/2021   Depression    Diverticulitis    Emphysema of lung (HCC) 2021   Ct scan   GERD (gastroesophageal reflux disease) 2021   Ischemic colitis    Mixed hyperlipidemia 10/05/2013   Neuromuscular disorder (HCC)    Osteoporosis    Osteopena   Rosacea    Family History  Problem Relation Age of Onset   Diabetes Father    Alcohol abuse Father    Cancer Father        prostate, lung   Hypertension Mother    Stroke Mother    Heart disease Mother    Diabetes Sister    Past Surgical History:  Procedure Laterality Date   ABDOMINAL HYSTERECTOMY     APPENDECTOMY     CARPAL TUNNEL RELEASE Right 2004   LUMBAR LAMINECTOMY  2000   MICRODISCECTOMY LUMBAR     SHOULDER ARTHROSCOPY WITH ROTATOR CUFF REPAIR Right 12/21/2020  Dr. Anderson   SPINE SURGERY     2 back surgeries   TUBAL LIGATION     Social History   Occupational History   Not on file  Tobacco Use   Smoking status: Former    Current packs/day: 0.00    Average packs/day: 1 pack/day for 30.0 years (30.0 ttl pk-yrs)    Types: Cigarettes    Start date: 10/04/1978    Quit date: 10/04/2008    Years since quitting: 15.7    Passive exposure: Past   Smokeless tobacco: Never  Vaping Use   Vaping status: Never Used  Substance and Sexual Activity   Alcohol use: No   Drug use: No   Sexual activity: Not on file    Eaton Folmar Afton Alderton, M.D. [facility], Hand Surgery

## 2024-06-23 ENCOUNTER — Ambulatory Visit: Admitting: Orthopedic Surgery

## 2024-06-23 DIAGNOSIS — M1811 Unilateral primary osteoarthritis of first carpometacarpal joint, right hand: Secondary | ICD-10-CM

## 2024-06-23 DIAGNOSIS — M654 Radial styloid tenosynovitis [de Quervain]: Secondary | ICD-10-CM | POA: Diagnosis not present

## 2024-06-23 MED ORDER — LIDOCAINE HCL 1 % IJ SOLN
1.0000 mL | INTRAMUSCULAR | Status: AC | PRN
  Administered 2024-06-23: 1 mL

## 2024-06-23 MED ORDER — BETAMETHASONE SOD PHOS & ACET 6 (3-3) MG/ML IJ SUSP
6.0000 mg | INTRAMUSCULAR | Status: AC | PRN
  Administered 2024-06-23: 6 mg via INTRA_ARTICULAR

## 2024-07-05 ENCOUNTER — Ambulatory Visit: Admitting: Orthopedic Surgery

## 2024-07-27 ENCOUNTER — Ambulatory Visit (INDEPENDENT_AMBULATORY_CARE_PROVIDER_SITE_OTHER): Admitting: Family Medicine

## 2024-07-27 ENCOUNTER — Encounter (HOSPITAL_BASED_OUTPATIENT_CLINIC_OR_DEPARTMENT_OTHER): Payer: Self-pay | Admitting: Family Medicine

## 2024-07-27 VITALS — BP 128/82 | HR 66 | Temp 97.7°F | Resp 18 | Ht 63.0 in | Wt 145.0 lb

## 2024-07-27 DIAGNOSIS — J439 Emphysema, unspecified: Secondary | ICD-10-CM

## 2024-07-27 DIAGNOSIS — R7303 Prediabetes: Secondary | ICD-10-CM | POA: Insufficient documentation

## 2024-07-27 DIAGNOSIS — Z13820 Encounter for screening for osteoporosis: Secondary | ICD-10-CM

## 2024-07-27 DIAGNOSIS — R0989 Other specified symptoms and signs involving the circulatory and respiratory systems: Secondary | ICD-10-CM

## 2024-07-27 DIAGNOSIS — E782 Mixed hyperlipidemia: Secondary | ICD-10-CM

## 2024-07-27 DIAGNOSIS — Z0000 Encounter for general adult medical examination without abnormal findings: Secondary | ICD-10-CM

## 2024-07-27 NOTE — Patient Instructions (Addendum)
 Please return for fasting blood work.  For fasting, if your blood work is in the morning please do not eat any food after midnight.  You may have water or black coffee prior to your lab work.  Please take all regularly prescribed medications even if you are fasting.  If your blood work is in the afternoon, please fast for at least 5 to 6 hours.  You may continue to drink water and/or black coffee prior to your lab work.  Please take all scheduled medications even if you are fasting.   Things to do to keep yourself healthy: - Exercise at least 30-45 minutes a day, 3-4 days a week.  - Eat a low-fat diet with lots of fruits and vegetables, up to 7-9 servings per day.  - Seatbelts can save your life. Wear them always.  - Smoke detectors on every level of your home, check batteries every year.  - Eye Doctor - have an eye exam every 1-2 years  - Safe sex - if you may be exposed to STDs, use a condom.  - No smoking, vaping, or use of any tobacco products.  - Alcohol  -  If you drink, do it moderately, less than 2 drinks per day.  - No illegal drug use.  - Depression is common in our stressful world.If you're feeling down or losing interest in things you normally enjoy, please come in for a visit.  - Violence - If anyone is threatening or hurting you, please call immediately.

## 2024-07-27 NOTE — Progress Notes (Signed)
 Subjective:   Kathryn Lamb 1950/12/19  07/27/2024   CC: Chief Complaint  Patient presents with   Diabetes    HPI: Kathryn Lamb is a 72 y.o. female who presents for a routine health maintenance exam.  Labs collected at time of visit.   COPD: Kathryn Lamb presents for the medical management of COPD.  Current medications: Trellegy Oxygen use: no Rescue inhaler frequency: Infrequent   Worsening cough or dyspnea: No COPD status: stable   HEALTH SCREENINGS: - Vision Screening: up to date - Dental Visits: Recommended for cleaning  - Pap smear: not applicable - Breast Exam: up to date - STD Screening: Declined - Mammogram (40+): Up to date  - Colonoscopy (45+): Up to date  - Bone Density (65+ or under 65 with predisposing conditions): Ordered today  - Lung CA screening with low-dose CT:  Up to date Adults age 30-80 who are current cigarette smokers or quit within the last 15 years. Must have 20 pack year history.   Depression and Anxiety Screen done today and results listed below:     07/27/2024    1:12 PM 03/03/2024   10:57 AM 12/23/2023    2:39 PM 11/05/2023   10:59 AM 07/15/2023    4:46 PM  Depression screen PHQ 2/9  Decreased Interest 0 0 0 0 0  Down, Depressed, Hopeless 0 0 0 0 0  PHQ - 2 Score 0 0 0 0 0  Altered sleeping 0  0 0   Tired, decreased energy 0  0 0   Change in appetite 0  0 0   Feeling bad or failure about yourself  0  0 0   Trouble concentrating 0  0 0   Moving slowly or fidgety/restless 0  0 0   Suicidal thoughts 0  0 0   PHQ-9 Score 0  0  0    Difficult doing work/chores Not difficult at all  Not difficult at all Not difficult at all      Data saved with a previous flowsheet row definition      07/27/2024    1:13 PM 12/23/2023    2:40 PM 11/05/2023   10:59 AM  GAD 7 : Generalized Anxiety Score  Nervous, Anxious, on Edge 0 0 0  Control/stop worrying 0 0 0  Worry too much - different things 0 0 0  Trouble relaxing 0 0 0  Restless 0 0  0  Easily annoyed or irritable 0 0 0  Afraid - awful might happen 0 0 0  Total GAD 7 Score 0 0 0  Anxiety Difficulty Not difficult at all Not difficult at all Not difficult at all    IMMUNIZATIONS: - Tdap: Tetanus vaccination status reviewed: Declined. - HPV: Not applicable - Influenza: Up to date - Pneumovax: Up to date - Prevnar 20: Up to date - Shingrix (50+): Recommended   Past medical history, surgical history, medications, allergies, family history and social history reviewed with patient today and changes made to appropriate areas of the chart.   Past Medical History:  Diagnosis Date   Allergy    Anxiety    Sometimes   Arthritis    After surgerys   Cataract 15 yrs ago   Baby cataracts   Complete tear of right rotator cuff 01/18/2021   Depression    Diverticulitis    Emphysema of lung (HCC) 2021   Ct scan   GERD (gastroesophageal reflux disease) 2021   Ischemic colitis  Mixed hyperlipidemia 10/05/2013   Neuromuscular disorder (HCC)    Osteoporosis    Osteopena   Rosacea     Past Surgical History:  Procedure Laterality Date   ABDOMINAL HYSTERECTOMY     APPENDECTOMY  Years ago   With a separate surgery   CARPAL TUNNEL RELEASE Right 2004   LUMBAR LAMINECTOMY  2000   MICRODISCECTOMY LUMBAR     SHOULDER ARTHROSCOPY WITH ROTATOR CUFF REPAIR Right 12/21/2020   Dr. Anderson   SPINE SURGERY     2 back surgeries   TUBAL LIGATION     Tubes removed    Current Outpatient Medications on File Prior to Visit  Medication Sig   albuterol  (VENTOLIN  HFA) 108 (90 Base) MCG/ACT inhaler Inhale 2 puffs into the lungs every 6 (six) hours as needed for wheezing or shortness of breath.   aspirin EC 81 MG tablet Take 81 mg by mouth daily. Swallow whole.   atorvastatin  (LIPITOR) 80 MG tablet TAKE 1 TABLET BY MOUTH  DAILY FOR CHOLESTEROL   buPROPion  (WELLBUTRIN  XL) 300 MG 24 hr tablet Take  1 tablet  Daily for Mood , Focus & Concentration                                             /                                                                   TAKE                                         BY                                                 MOUTH   cetirizine (ZYRTEC) 10 MG tablet Take 10 mg by mouth daily.   Cholecalciferol (VITAMIN D ) 125 MCG (5000 UT) CAPS Take 1 capsule Daily   cyanocobalamin  1000 MCG tablet Take 1,000 mcg by mouth daily.   famotidine  (PEPCID ) 40 MG tablet TAKE 1 TABLET BY MOUTH  DAILY FOR ACID REFLUX AND  INDIGESTION   gabapentin  (NEURONTIN ) 300 MG capsule TAKE 1 CAPSULE BY MOUTH 3 TIMES  DAILY FOR PAIN   metroNIDAZOLE  (METROGEL ) 0.75 % gel Apply 1 Application topically 2 (two) times daily. For rosacea   Multiple Vitamin (MULTIVITAMIN WITH MINERALS) TABS tablet Take 1 tablet by mouth daily.   Probiotic Product (PROBIOTIC DAILY PO) Take by mouth daily.   TRELEGY ELLIPTA  100-62.5-25 MCG/ACT AEPB USE 1 INHALATION BY MOUTH DAILY   VITAMIN K PO Take by mouth daily.   No current facility-administered medications on file prior to visit.    Allergies[1]   Social History   Socioeconomic History   Marital status: Married    Spouse name: Not on file   Number of children: Not on file   Years of education: Not on file   Highest education level: 12th grade  Occupational History   Not on file  Tobacco Use   Smoking status: Former    Current packs/day: 0.00    Average packs/day: 1 pack/day for 30.0 years (30.0 ttl pk-yrs)    Types: Cigarettes    Start date: 10/04/1978    Quit date: 10/04/2008    Years since quitting: 15.8    Passive exposure: Past   Smokeless tobacco: Never  Vaping Use   Vaping status: Never Used  Substance and Sexual Activity   Alcohol use: Never   Drug use: Never   Sexual activity: Not on file  Other Topics Concern   Not on file  Social History Narrative   Not on file   Social Drivers of Health   Tobacco Use: Medium Risk (07/27/2024)   Patient History    Smoking Tobacco Use: Former    Smokeless Tobacco Use:  Never    Passive Exposure: Past  Physicist, Medical Strain: Patient Declined (07/23/2024)   Overall Financial Resource Strain (CARDIA)    Difficulty of Paying Living Expenses: Patient declined  Food Insecurity: Patient Declined (07/23/2024)   Epic    Worried About Programme Researcher, Broadcasting/film/video in the Last Year: Patient declined    Barista in the Last Year: Patient declined  Transportation Needs: No Transportation Needs (07/23/2024)   Epic    Lack of Transportation (Medical): No    Lack of Transportation (Non-Medical): No  Physical Activity: Inactive (07/23/2024)   Exercise Vital Sign    Days of Exercise per Week: 0 days    Minutes of Exercise per Session: Not on file  Stress: No Stress Concern Present (07/23/2024)   [employer] of Occupational Health - Occupational Stress Questionnaire    Feeling of Stress: Not at all  Social Connections: Moderately Integrated (07/23/2024)   Social Connection and Isolation Panel    Frequency of Communication with Friends and Family: Twice a week    Frequency of Social Gatherings with Friends and Family: Twice a week    Attends Religious Services: More than 4 times per year    Active Member of [employer] or Organizations: No    Attends Banker Meetings: Not on file    Marital Status: Married  Intimate Partner Violence: Not At Risk (03/03/2024)   Epic    Fear of Current or Ex-Partner: No    Emotionally Abused: No    Physically Abused: No    Sexually Abused: No  Depression (PHQ2-9): Low Risk (07/27/2024)   Depression (PHQ2-9)    PHQ-2 Score: 0  Alcohol Screen: Low Risk (03/03/2024)   Alcohol Screen    Last Alcohol Screening Score (AUDIT): 0  Housing: Unknown (07/23/2024)   Epic    Unable to Pay for Housing in the Last Year: No    Number of Times Moved in the Last Year: Not on file    Homeless in the Last Year: No  Utilities: Not At Risk (03/03/2024)   Epic    Threatened with loss of utilities: No  Health Literacy: Adequate Health  Literacy (03/03/2024)   B1300 Health Literacy    Frequency of need for help with medical instructions: Never   Tobacco Use History[2] Social History   Substance and Sexual Activity  Alcohol Use Never    Family History  Problem Relation Age of Onset   Diabetes Father    Alcohol abuse Father    Cancer Father        prostate, lung   Hypertension Father    Heart disease  Father    Hypertension Mother    Stroke Mother    Heart disease Mother    COPD Mother    Arthritis Mother    Vision loss Mother    Diabetes Sister    Hypertension Sister      ROS: Denies fever, fatigue, unexplained weight loss/gain, chest pain, SHOB, and palpitations. Denies neurological deficits, gastrointestinal or genitourinary complaints, and skin changes.   Objective:   Today's Vitals   07/27/24 1308 07/27/24 1328  BP: (!) 146/84 128/82  Pulse: 66   Resp: 18   Temp: 97.7 F (36.5 C)   TempSrc: Oral   SpO2: 94%   Weight: 145 lb (65.8 kg)   Height: 5' 3 (1.6 m)   PainSc: 5    PainLoc: Shoulder     GENERAL APPEARANCE: Well-appearing, in NAD. Well nourished.  SKIN: Pink, warm and dry. Turgor normal. No rash, lesion, ulceration, or ecchymoses. Hair evenly distributed.  HEENT: HEAD: Normocephalic.  EYES: PERRLA. EOMI. Lids intact w/o defect. Sclera white, Conjunctiva pink w/o exudate.  EARS: External ear w/o redness, swelling, masses or lesions. EAC clear. TM's intact, translucent w/o bulging, appropriate landmarks visualized. Appropriate acuity to conversational tones.  NOSE: Septum midline w/o deformity. Nares patent, mucosa pink and non-inflamed w/o drainage. No sinus tenderness.  THROAT: Uvula midline. Oropharynx clear. Oral mucosa pink and moist.  NECK: Supple, Trachea midline. Full ROM w/o pain or tenderness. No lymphadenopathy. Thyroid  non-tender w/o enlargement or palpable masses.  RESPIRATORY: Chest wall symmetrical w/o masses. Respirations even and non-labored. Breath sounds clear to  auscultation bilaterally. No wheezes, rales, rhonchi, or crackles. CARDIAC: S1, S2 present, regular rate and rhythm. No gallops, murmurs, rubs, or clicks. PMI w/o lifts, heaves, or thrills. No carotid bruits. Capillary refill <2 seconds. Peripheral pulses 2+ bilaterally. GI: Abdomen soft w/o distention. Normoactive bowel sounds. No palpable masses or tenderness. No guarding or rebound tenderness. Liver and spleen w/o tenderness or enlargement. No CVA tenderness.  MSK: Muscle tone and strength appropriate for age, w/o atrophy or abnormal movement.  EXTREMITIES: Active ROM intact, w/o tenderness, crepitus, or contracture. No obvious joint deformities or effusions. No clubbing, edema, or cyanosis.  NEUROLOGIC: CN's II-XII intact. Motor strength symmetrical with no obvious weakness. No sensory deficits. DTR's 2+ symmetric bilaterally. Steady, even gait.  PSYCH/MENTAL STATUS: Alert, oriented x 3. Cooperative, appropriate mood and affect.       Assessment & Plan:  1. Annual physical exam (Primary) Discussed preventative screenings, vaccines, and healthy lifestyle with patient. Fasting labs to be completed.   - CBC with Differential/Platelet - Comprehensive metabolic panel with GFR - TSH  2. Pulmonary emphysema (HCC) Stable. Continue Trellegy as directed and pulmonology management UTD on vaccines.   3. Hyperlipidemia, mixed Improved previously. Will obtain LP fasting.  - Lipid panel  4. Labile hypertension Stable. Will continue current management.   5. Encounter for osteoporosis screening in asymptomatic postmenopausal patient - DG Bone Density; Future  6. Prediabetes Stable previously and controlled with diet, exercise.  - Hemoglobin A1c   Orders Placed This Encounter  Procedures   DG Bone Density    Standing Status:   Future    Expiration Date:   07/27/2025    Reason for Exam (SYMPTOM  OR DIAGNOSIS REQUIRED):   Asymptomatic screening in postmenopausal patient    Preferred  imaging location?:   [facility]   CBC with Differential/Platelet   Comprehensive metabolic panel with GFR   Lipid panel   TSH   Hemoglobin A1c  PATIENT COUNSELING:  - Encouraged a healthy well-balanced diet. Patient may adjust caloric intake to maintain or achieve ideal body weight. May reduce intake of dietary saturated fat and total fat and have adequate dietary potassium and calcium  preferably from fresh fruits, vegetables, and low-fat dairy products.   - Advised to avoid cigarette smoking. - Discussed with the patient that most people either abstain from alcohol or drink within safe limits (<=14/week and <=4 drinks/occasion for males, <=7/weeks and <= 3 drinks/occasion for females) and that the risk for alcohol disorders and other health effects rises proportionally with the number of drinks per week and how often a drinker exceeds daily limits. - Discussed cessation/primary prevention of drug use and availability of treatment for abuse.  - Discussed sexually transmitted diseases, avoidance of unintended pregnancy and contraceptive alternatives.  - Stressed the importance of regular exercise - Injury prevention: Discussed safety belts, safety helmets, smoke detector, smoking near bedding or upholstery.  - Dental health: Discussed importance of regular tooth brushing, flossing, and dental visits.   NEXT PREVENTATIVE PHYSICAL DUE IN 1 YEAR.  Return in about 1 year (around 07/27/2025) for ANNUAL PHYSICAL.  Patient to reach out to office if new, worrisome, or unresolved symptoms arise or if no improvement in patient's condition. Patient verbalized understanding and is agreeable to treatment plan. All questions answered to patient's satisfaction.    Thersia Schuyler Stark, FNP     [1]  Allergies Allergen Reactions   Doxycycline    Erythromycin    Penicillins    Sulfa Antibiotics    Clindamycin Rash    C-diff  [2]  Social History Tobacco Use  Smoking Status Former    Current packs/day: 0.00   Average packs/day: 1 pack/day for 30.0 years (30.0 ttl pk-yrs)   Types: Cigarettes   Start date: 10/04/1978   Quit date: 10/04/2008   Years since quitting: 15.8   Passive exposure: Past  Smokeless Tobacco Never

## 2024-08-03 ENCOUNTER — Encounter: Payer: Medicare Other | Admitting: Internal Medicine

## 2024-08-08 NOTE — Progress Notes (Unsigned)
 "   Kathryn Lamb - 73 y.o. female MRN [id number]  Date of birth: August 26, 1950  Office Visit Note: Visit Date: 08/09/2024 PCP: Knute Thersia Bitters, FNP Referred by: Knute Thersia Bitters, FNP  Subjective: No chief complaint on file.  HPI: Kathryn Lamb is a pleasant 73 y.o. female who returns today for follow-up regarding right wrist de Quervain's tenosynovitis and thumb CMC arthritis that have undergone previous injections x 2, most recently approximately 6 weeks prior.  Is utilizing the bracing as instructed as well for activities.***   Pertinent ROS were reviewed with the patient and found to be negative unless otherwise specified above in HPI.    Assessment & Plan: Visit Diagnoses:  No diagnosis found.     Plan: Extensive discussion was had with the patient today regarding her right thumb basilar joint pain.  X-rays from prior confirm diagnosis of ongoing right thumb CMC arthritis which correlates with her clinical examination.  We reviewed the etiology and pathophysiology of this condition as well as appropriate treatment modalities ranging from conservative to surgical.  From a conservative standpoint, we discussed bracing, activity modification, nonsteroidal anti-inflammatory medications both oral and topical, and finally cortisone injections.  From surgical standpoint, we discussed the possibility for left thumb CMC arthroplasty in the future should symptoms refractory to conservative care.  I discussed the surgical treatment as well as the postop protocol in detail with her as well today for her understanding.   At this juncture, she would like to trial repeat injections to the right thumb CMC region and right wrist first extensor compartment.  We discussed the underlying etiology and pathophysiology of both condition as well as treatment modalities ranging from conservative to surgical.  From a conservative standpoint, we discussed activity modification, anti-inflammatory  medication both topical and oral, bracing, therapy and cortisone injections.  From a surgical standpoint, I explained to patient that we can perform right wrist first extensor compartment release in conjunction with the right thumb [facility] arthroplasty should symptoms remain refractory conservative care.  ***    Follow-up: No follow-ups on file.   Meds & Orders: No orders of the defined types were placed in this encounter.   No orders of the defined types were placed in this encounter.    Procedures: No procedures performed      Clinical History: No specialty comments available.  She reports that she quit smoking about 15 years ago. Her smoking use included cigarettes. She started smoking about 45 years ago. She has a 30 pack-year smoking history. She has been exposed to tobacco smoke. She has never used smokeless tobacco.  Recent Labs    12/18/23 1029  HGBA1C 6.0*    Objective:   Vital Signs: There were no vitals taken for this visit.  Physical Exam  Gen: Well-appearing, in no acute distress; non-toxic CV: Regular Rate. Well-perfused. Warm.  Resp: Breathing unlabored on room air; no wheezing. Psych: Fluid speech in conversation; appropriate affect; normal thought process  Ortho Exam General: Patient is well appearing and in no distress.    Skin and Muscle: Muscle bulk and contour normal, no signs of atrophy.      Range of Motion and Palpation Tests: Mobility is full about the elbows with flexion and extension.  Forearm supination and pronation are 85/85 bilaterally.  Wrist flexion/extension is 75/65 bilaterally.  Digital flexion and extension are full.  Thumb opposition is full to the base of the small fingers bilaterally.     No cords or nodules are  palpated.  No triggering is observed.     *** tenderness over the right thumb CMC articulation is observed, positive grind for pain, positive crepitus.  MP hyperextension mild, less than 10 degrees.    Finklestein test ***  right with minimal swelling at the wrist and radial styloid region   Neurologic, Vascular, Motor: Sensation is intact to light touch in the median/radial/ulnar distributions.  Fingers pink and well perfused.  Capillary refill is brisk.     Imaging: No results found.   Past Medical/Family/Surgical/Social History: Medications & Allergies reviewed per EMR, new medications updated. Patient Active Problem List   Diagnosis Date Noted   Prediabetes 07/27/2024   Former smoker (30 pack/year, quit 2011) 02/07/2021   Labile hypertension 05/28/2020   Osteoarthritis of right AC (acromioclavicular) joint 01/18/2020   Os acromiale of right shoulder 01/18/2020   Carpal tunnel syndrome, left upper limb 01/18/2020   Aortic atherosclerosis (HCC) by Chest CT scan 01/07/2020 01/11/2020   Emphysema lung (HCC) 01/11/2020   Diverticulosis large intestine w/o perforation or abscess w/o bleeding 03/13/2018   Abnormal glucose 08/22/2014   Hyperlipidemia, mixed 10/05/2013   Rosacea    Anxiety    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HCC)    History of ischemic colitis    Past Medical History:  Diagnosis Date   Allergy    Anxiety    Sometimes   Arthritis    After surgerys   Cataract 15 yrs ago   Baby cataracts   Complete tear of right rotator cuff 01/18/2021   Depression    Diverticulitis    Emphysema of lung (HCC) 2021   Ct scan   GERD (gastroesophageal reflux disease) 2021   Ischemic colitis    Mixed hyperlipidemia 10/05/2013   Neuromuscular disorder (HCC)    Osteoporosis    Osteopena   Rosacea    Family History  Problem Relation Age of Onset   Diabetes Father    Alcohol abuse Father    Cancer Father        prostate, lung   Hypertension Father    Heart disease Father    Hypertension Mother    Stroke Mother    Heart disease Mother    COPD Mother    Arthritis Mother    Vision loss Mother    Diabetes Sister    Hypertension Sister    Past Surgical History:  Procedure Laterality  Date   ABDOMINAL HYSTERECTOMY     APPENDECTOMY  Years ago   With a separate surgery   CARPAL TUNNEL RELEASE Right 2004   LUMBAR LAMINECTOMY  2000   MICRODISCECTOMY LUMBAR     SHOULDER ARTHROSCOPY WITH ROTATOR CUFF REPAIR Right 12/21/2020   Dr. Anderson   SPINE SURGERY     2 back surgeries   TUBAL LIGATION     Tubes removed   Social History   Occupational History   Not on file  Tobacco Use   Smoking status: Former    Current packs/day: 0.00    Average packs/day: 1 pack/day for 30.0 years (30.0 ttl pk-yrs)    Types: Cigarettes    Start date: 10/04/1978    Quit date: 10/04/2008    Years since quitting: 15.8    Passive exposure: Past   Smokeless tobacco: Never  Vaping Use   Vaping status: Never Used  Substance and Sexual Activity   Alcohol use: Never   Drug use: Never   Sexual activity: Not on file    Jarvin Ogren Afton Alderton, M.D. [facility] [place],  Hand Surgery   "

## 2024-08-09 ENCOUNTER — Ambulatory Visit: Admitting: Orthopedic Surgery

## 2024-08-09 DIAGNOSIS — M1811 Unilateral primary osteoarthritis of first carpometacarpal joint, right hand: Secondary | ICD-10-CM | POA: Diagnosis not present

## 2024-08-09 DIAGNOSIS — M654 Radial styloid tenosynovitis [de Quervain]: Secondary | ICD-10-CM

## 2024-08-20 ENCOUNTER — Ambulatory Visit: Admitting: Surgical

## 2024-08-23 ENCOUNTER — Other Ambulatory Visit: Payer: Self-pay

## 2024-08-23 ENCOUNTER — Encounter: Payer: Self-pay | Admitting: Surgical

## 2024-08-23 ENCOUNTER — Ambulatory Visit: Admitting: Surgical

## 2024-08-23 DIAGNOSIS — M12811 Other specific arthropathies, not elsewhere classified, right shoulder: Secondary | ICD-10-CM

## 2024-08-23 MED ORDER — BUPIVACAINE HCL 0.25 % IJ SOLN
9.0000 mL | INTRAMUSCULAR | Status: AC | PRN
  Administered 2024-08-23: 9 mL via INTRA_ARTICULAR

## 2024-08-23 MED ORDER — METHYLPREDNISOLONE ACETATE 40 MG/ML IJ SUSP
40.0000 mg | INTRAMUSCULAR | Status: AC | PRN
  Administered 2024-08-23: 40 mg via INTRA_ARTICULAR

## 2024-08-23 NOTE — Progress Notes (Signed)
 "  Office Visit Note   Patient: Kathryn Lamb           Date of Birth: 09/28/1950           MRN: [id number] Visit Date: 08/23/2024 Requested by: Knute Thersia Bitters, FNP [address] [place],  [place] [address] PCP: Knute Thersia Bitters, FNP  Subjective: Chief Complaint  Patient presents with   Right Shoulder - Pain, Follow-up    HPI: Kathryn Lamb is a 74 y.o. female who presents to the office reporting right shoulder pain.  Patient describes intermittent pain in the anterior aspect of the shoulder radiating into the bicep region.  She denies any scapular pain, numbness/tingling, neck pain.  Did have prior surgery to the shoulder about 3 years ago.  Has chronic Popeye deformity.  She feels that overall the right shoulder pain is better than it was.  Still feels some crepitus in the shoulder with certain movements.  Has to be careful about what she carries as not to cause increased pain.  She does feel weakness with lifting heavy objects as well.  Takes ibuprofen over-the-counter.  Sleeping okay at night..                ROS: All systems reviewed are negative as they relate to the chief complaint within the history of present illness.  Patient denies fevers or chills.  Assessment & Plan: Visit Diagnoses:  1. Rotator cuff arthropathy of right shoulder     Plan: Impression is 74 year old female with history of rotator cuff arthropathy.  We discussed options availed to patient mainly centering around surgery versus glenohumeral junction versus physical therapy.  She would like to try glenohumeral injection today as cortisone injections have historically provided her good relief.  Injection was administered under ultrasound guidance and patient tolerated procedure well without complication.  We will see how this does for her and she will send me a message on MyChart within the next week or 2 to discuss if she would like to try physical therapy.  If this is helpful, we  can repeat her glenohumeral injection in 3 to 4 months.  Follow-up as needed.  Follow-Up Instructions: No follow-ups on file.   Orders:  Orders Placed This Encounter  Procedures   US  Guided Needle Placement - No Linked Charges   No orders of the defined types were placed in this encounter.     Procedures: Large Joint Inj: R glenohumeral on 08/23/2024 3:56 PM Indications: pain and diagnostic evaluation Details: 22 G 3.5 in needle, ultrasound-guided posterior approach Medications: 9 mL bupivacaine  0.25 %; 40 mg methylPREDNISolone  acetate 40 MG/ML Outcome: tolerated well, no immediate complications Procedure, treatment alternatives, risks and benefits explained, specific risks discussed. Consent was given by the patient. Immediately prior to procedure a time out was called to verify the correct patient, procedure, equipment, support staff and site/side marked as required. Patient was prepped and draped in the usual sterile fashion.       Clinical Data: No additional findings.  Objective: Vital Signs: There were no vitals taken for this visit.  Physical Exam:  Constitutional: Patient appears well-developed HEENT:  Head: Normocephalic Eyes:EOM are normal Neck: Normal range of motion Cardiovascular: Normal rate Pulmonary/chest: Effort normal Neurologic: Patient is alert Skin: Skin is warm Psychiatric: Patient has normal mood and affect  Ortho Exam: Ortho exam ortho exam demonstrates left shoulder with 70 degrees X rotation, 90 degrees abduction, 170 degrees forward elevation which is compared with the right shoulder  with 45 degrees external rotation, 70 degrees abduction, 150 degrees forward elevation passively.  No obvious deformity to inspection of the shoulder.  Axillary nerve is intact with deltoid firing.  2+ radial pulse of the bilateral upper extremities.  Intact EPL, FPL, finger abduction, pronation/supination, bicep, tricep, deltoid.  Rotator cuff strength testing  demonstrates external rotation weakness of the right shoulder relative to the left with 4/5 strength relative to 5/5 strength.  5/5 strength of the rest of the rotator cuff with supra/subscap strength bilaterally..  Tender with palpation over bicipital groove moderately and no tenderness over the [facility] joint..  Negative crossarm adduction test.  Negative O'Brien sign.  Negative Neer's and Hawkins impingement signs.  No cellulitis or skin changes noted.  Positive crepitus noted with passive motion of the shoulder.  Negative external rotation lag sign.  Negative Hornblower sign.  Negative Spurling sign.  Negative Lhermitte sign.  No pain reproduced with cervical spine range of motion.  Specialty Comments:  No specialty comments available.  Imaging: No results found.   PMFS History: Patient Active Problem List   Diagnosis Date Noted   Prediabetes 07/27/2024   Former smoker (30 pack/year, quit 2011) 02/07/2021   Labile hypertension 05/28/2020   Osteoarthritis of right AC (acromioclavicular) joint 01/18/2020   Os acromiale of right shoulder 01/18/2020   Carpal tunnel syndrome, left upper limb 01/18/2020   Aortic atherosclerosis (HCC) by Chest CT scan 01/07/2020 01/11/2020   Emphysema lung (HCC) 01/11/2020   Diverticulosis large intestine w/o perforation or abscess w/o bleeding 03/13/2018   Abnormal glucose 08/22/2014   Hyperlipidemia, mixed 10/05/2013   Rosacea    Anxiety    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HCC)    History of ischemic colitis    Past Medical History:  Diagnosis Date   Allergy    Anxiety    Sometimes   Arthritis    After surgerys   Cataract 15 yrs ago   Baby cataracts   Complete tear of right rotator cuff 01/18/2021   Depression    Diverticulitis    Emphysema of lung (HCC) 2021   Ct scan   GERD (gastroesophageal reflux disease) 2021   Ischemic colitis    Mixed hyperlipidemia 10/05/2013   Neuromuscular disorder (HCC)    Osteoporosis    Osteopena   Rosacea      Family History  Problem Relation Age of Onset   Diabetes Father    Alcohol abuse Father    Cancer Father        prostate, lung   Hypertension Father    Heart disease Father    Hypertension Mother    Stroke Mother    Heart disease Mother    COPD Mother    Arthritis Mother    Vision loss Mother    Diabetes Sister    Hypertension Sister     Past Surgical History:  Procedure Laterality Date   ABDOMINAL HYSTERECTOMY     APPENDECTOMY  Years ago   With a separate surgery   CARPAL TUNNEL RELEASE Right 2004   LUMBAR LAMINECTOMY  2000   MICRODISCECTOMY LUMBAR     SHOULDER ARTHROSCOPY WITH ROTATOR CUFF REPAIR Right 12/21/2020   Dr. Anderson   SPINE SURGERY     2 back surgeries   TUBAL LIGATION     Tubes removed   Social History   Occupational History   Not on file  Tobacco Use   Smoking status: Former    Current packs/day: 0.00    Average packs/day: 1  pack/day for 30.0 years (30.0 ttl pk-yrs)    Types: Cigarettes    Start date: 10/04/1978    Quit date: 10/04/2008    Years since quitting: 15.8    Passive exposure: Past   Smokeless tobacco: Never  Vaping Use   Vaping status: Never Used  Substance and Sexual Activity   Alcohol use: Never   Drug use: Never   Sexual activity: Not on file        "

## 2025-07-28 ENCOUNTER — Encounter (HOSPITAL_BASED_OUTPATIENT_CLINIC_OR_DEPARTMENT_OTHER): Admitting: Family Medicine
